# Patient Record
Sex: Female | Born: 1975 | Race: Black or African American | Hispanic: No | Marital: Married | Smoking: Never smoker
Health system: Southern US, Community
[De-identification: ages and names within clinical notes are randomized; demographics above are authoritative.]

## PROBLEM LIST (undated history)

## (undated) DIAGNOSIS — F419 Anxiety disorder, unspecified: Secondary | ICD-10-CM

## (undated) DIAGNOSIS — R519 Headache, unspecified: Secondary | ICD-10-CM

## (undated) DIAGNOSIS — M659 Synovitis and tenosynovitis, unspecified: Secondary | ICD-10-CM

## (undated) DIAGNOSIS — Z9889 Other specified postprocedural states: Secondary | ICD-10-CM

## (undated) DIAGNOSIS — Z905 Acquired absence of kidney: Secondary | ICD-10-CM

## (undated) DIAGNOSIS — O2331 Infections of other parts of urinary tract in pregnancy, first trimester: Secondary | ICD-10-CM

## (undated) DIAGNOSIS — M65939 Unspecified synovitis and tenosynovitis, unspecified forearm: Secondary | ICD-10-CM

## (undated) DIAGNOSIS — K219 Gastro-esophageal reflux disease without esophagitis: Secondary | ICD-10-CM

## (undated) DIAGNOSIS — S63599A Other specified sprain of unspecified wrist, initial encounter: Secondary | ICD-10-CM

## (undated) DIAGNOSIS — F32A Depression, unspecified: Secondary | ICD-10-CM

## (undated) DIAGNOSIS — F329 Major depressive disorder, single episode, unspecified: Secondary | ICD-10-CM

## (undated) DIAGNOSIS — Z8719 Personal history of other diseases of the digestive system: Secondary | ICD-10-CM

## (undated) HISTORY — PX: SPINE SURGERY: SHX786

## (undated) HISTORY — PX: TUBAL LIGATION: SHX77

## (undated) HISTORY — PX: EYE SURGERY: SHX253

## (undated) HISTORY — DX: Major depressive disorder, single episode, unspecified: F32.9

## (undated) HISTORY — PX: ABDOMINAL HYSTERECTOMY: SHX81

## (undated) HISTORY — PX: BREAST SURGERY: SHX581

## (undated) HISTORY — DX: Depression, unspecified: F32.A

## (undated) HISTORY — PX: HERNIA REPAIR: SHX51

## (undated) HISTORY — DX: Infections of other parts of urinary tract in pregnancy, first trimester: O23.31

## (undated) HISTORY — DX: Anxiety disorder, unspecified: F41.9

---
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
## 1997-07-01 ENCOUNTER — Emergency Department (HOSPITAL_COMMUNITY): Admission: EM | Admit: 1997-07-01 | Discharge: 1997-07-01 | Payer: Self-pay | Admitting: Emergency Medicine

## 1997-10-06 ENCOUNTER — Emergency Department (HOSPITAL_COMMUNITY): Admission: EM | Admit: 1997-10-06 | Discharge: 1997-10-06 | Payer: Self-pay | Admitting: Emergency Medicine

## 1997-10-08 ENCOUNTER — Encounter: Admission: RE | Admit: 1997-10-08 | Discharge: 1998-01-06 | Payer: Self-pay | Admitting: Internal Medicine

## 1998-02-09 ENCOUNTER — Emergency Department (HOSPITAL_COMMUNITY): Admission: EM | Admit: 1998-02-09 | Discharge: 1998-02-09 | Payer: Self-pay | Admitting: Endocrinology

## 1998-02-09 ENCOUNTER — Encounter: Payer: Self-pay | Admitting: Endocrinology

## 1998-09-11 ENCOUNTER — Other Ambulatory Visit: Admission: RE | Admit: 1998-09-11 | Discharge: 1998-09-11 | Payer: Self-pay | Admitting: Obstetrics and Gynecology

## 1998-11-24 ENCOUNTER — Inpatient Hospital Stay (HOSPITAL_COMMUNITY): Admission: AD | Admit: 1998-11-24 | Discharge: 1998-11-24 | Payer: Self-pay | Admitting: Obstetrics and Gynecology

## 1999-03-05 ENCOUNTER — Inpatient Hospital Stay (HOSPITAL_COMMUNITY): Admission: AD | Admit: 1999-03-05 | Discharge: 1999-03-05 | Payer: Self-pay | Admitting: Obstetrics and Gynecology

## 1999-03-07 ENCOUNTER — Observation Stay (HOSPITAL_COMMUNITY): Admission: AD | Admit: 1999-03-07 | Discharge: 1999-03-07 | Payer: Self-pay | Admitting: Obstetrics & Gynecology

## 1999-03-12 ENCOUNTER — Inpatient Hospital Stay (HOSPITAL_COMMUNITY): Admission: AD | Admit: 1999-03-12 | Discharge: 1999-03-12 | Payer: Self-pay | Admitting: Obstetrics & Gynecology

## 1999-04-08 ENCOUNTER — Inpatient Hospital Stay (HOSPITAL_COMMUNITY): Admission: AD | Admit: 1999-04-08 | Discharge: 1999-04-08 | Payer: Self-pay | Admitting: Obstetrics and Gynecology

## 1999-04-09 ENCOUNTER — Inpatient Hospital Stay (HOSPITAL_COMMUNITY): Admission: AD | Admit: 1999-04-09 | Discharge: 1999-04-12 | Payer: Self-pay | Admitting: Obstetrics and Gynecology

## 2000-09-10 ENCOUNTER — Other Ambulatory Visit: Admission: RE | Admit: 2000-09-10 | Discharge: 2000-09-10 | Payer: Self-pay | Admitting: Obstetrics and Gynecology

## 2000-10-08 ENCOUNTER — Inpatient Hospital Stay (HOSPITAL_COMMUNITY): Admission: AD | Admit: 2000-10-08 | Discharge: 2000-10-08 | Payer: Self-pay | Admitting: Obstetrics and Gynecology

## 2001-02-14 ENCOUNTER — Inpatient Hospital Stay (HOSPITAL_COMMUNITY): Admission: AD | Admit: 2001-02-14 | Discharge: 2001-02-14 | Payer: Self-pay | Admitting: Obstetrics and Gynecology

## 2001-03-21 ENCOUNTER — Inpatient Hospital Stay (HOSPITAL_COMMUNITY): Admission: AD | Admit: 2001-03-21 | Discharge: 2001-03-24 | Payer: Self-pay | Admitting: Obstetrics and Gynecology

## 2001-03-27 ENCOUNTER — Encounter: Admission: RE | Admit: 2001-03-27 | Discharge: 2001-04-26 | Payer: Self-pay | Admitting: Obstetrics and Gynecology

## 2001-05-04 ENCOUNTER — Encounter: Payer: Self-pay | Admitting: Obstetrics and Gynecology

## 2001-05-05 ENCOUNTER — Encounter: Payer: Self-pay | Admitting: Obstetrics and Gynecology

## 2001-05-05 ENCOUNTER — Inpatient Hospital Stay (HOSPITAL_COMMUNITY): Admission: RE | Admit: 2001-05-05 | Discharge: 2001-05-06 | Payer: Self-pay | Admitting: Obstetrics and Gynecology

## 2001-05-10 ENCOUNTER — Ambulatory Visit (HOSPITAL_COMMUNITY): Admission: RE | Admit: 2001-05-10 | Discharge: 2001-05-10 | Payer: Self-pay | Admitting: Urology

## 2001-05-10 ENCOUNTER — Encounter: Payer: Self-pay | Admitting: Urology

## 2001-05-13 ENCOUNTER — Emergency Department (HOSPITAL_COMMUNITY): Admission: EM | Admit: 2001-05-13 | Discharge: 2001-05-13 | Payer: Self-pay | Admitting: Emergency Medicine

## 2001-05-16 ENCOUNTER — Ambulatory Visit (HOSPITAL_BASED_OUTPATIENT_CLINIC_OR_DEPARTMENT_OTHER): Admission: RE | Admit: 2001-05-16 | Discharge: 2001-05-16 | Payer: Self-pay | Admitting: Urology

## 2001-06-20 ENCOUNTER — Ambulatory Visit (HOSPITAL_COMMUNITY): Admission: RE | Admit: 2001-06-20 | Discharge: 2001-06-20 | Payer: Self-pay | Admitting: Urology

## 2001-06-20 ENCOUNTER — Encounter: Payer: Self-pay | Admitting: Urology

## 2001-11-02 ENCOUNTER — Ambulatory Visit (HOSPITAL_BASED_OUTPATIENT_CLINIC_OR_DEPARTMENT_OTHER): Admission: RE | Admit: 2001-11-02 | Discharge: 2001-11-02 | Payer: Self-pay | Admitting: Urology

## 2001-11-08 ENCOUNTER — Ambulatory Visit (HOSPITAL_COMMUNITY): Admission: RE | Admit: 2001-11-08 | Discharge: 2001-11-08 | Payer: Self-pay | Admitting: Urology

## 2001-11-10 ENCOUNTER — Ambulatory Visit (HOSPITAL_COMMUNITY): Admission: RE | Admit: 2001-11-10 | Discharge: 2001-11-10 | Payer: Self-pay | Admitting: Urology

## 2001-11-10 ENCOUNTER — Encounter: Payer: Self-pay | Admitting: Urology

## 2001-11-28 ENCOUNTER — Ambulatory Visit (HOSPITAL_COMMUNITY): Admission: RE | Admit: 2001-11-28 | Discharge: 2001-11-28 | Payer: Self-pay | Admitting: Urology

## 2001-11-28 ENCOUNTER — Encounter: Payer: Self-pay | Admitting: Urology

## 2001-12-05 ENCOUNTER — Encounter: Payer: Self-pay | Admitting: Urology

## 2001-12-05 ENCOUNTER — Observation Stay (HOSPITAL_COMMUNITY): Admission: RE | Admit: 2001-12-05 | Discharge: 2001-12-06 | Payer: Self-pay | Admitting: Urology

## 2001-12-05 HISTORY — PX: OTHER SURGICAL HISTORY: SHX169

## 2002-01-11 ENCOUNTER — Other Ambulatory Visit: Admission: RE | Admit: 2002-01-11 | Discharge: 2002-01-11 | Payer: Self-pay | Admitting: Obstetrics and Gynecology

## 2002-03-01 ENCOUNTER — Ambulatory Visit (HOSPITAL_BASED_OUTPATIENT_CLINIC_OR_DEPARTMENT_OTHER): Admission: RE | Admit: 2002-03-01 | Discharge: 2002-03-01 | Payer: Self-pay | Admitting: Urology

## 2002-03-01 HISTORY — PX: OTHER SURGICAL HISTORY: SHX169

## 2002-04-12 ENCOUNTER — Encounter (INDEPENDENT_AMBULATORY_CARE_PROVIDER_SITE_OTHER): Payer: Self-pay | Admitting: Specialist

## 2002-04-12 ENCOUNTER — Inpatient Hospital Stay (HOSPITAL_COMMUNITY): Admission: RE | Admit: 2002-04-12 | Discharge: 2002-04-15 | Payer: Self-pay | Admitting: Urology

## 2002-04-12 HISTORY — PX: TOTAL NEPHRECTOMY: SHX415

## 2003-01-25 ENCOUNTER — Other Ambulatory Visit: Admission: RE | Admit: 2003-01-25 | Discharge: 2003-01-25 | Payer: Self-pay | Admitting: Obstetrics and Gynecology

## 2003-02-06 ENCOUNTER — Emergency Department (HOSPITAL_COMMUNITY): Admission: EM | Admit: 2003-02-06 | Discharge: 2003-02-06 | Payer: Self-pay | Admitting: Emergency Medicine

## 2003-11-25 ENCOUNTER — Emergency Department (HOSPITAL_COMMUNITY): Admission: EM | Admit: 2003-11-25 | Discharge: 2003-11-25 | Payer: Self-pay | Admitting: Emergency Medicine

## 2004-01-30 ENCOUNTER — Other Ambulatory Visit: Admission: RE | Admit: 2004-01-30 | Discharge: 2004-01-30 | Payer: Self-pay | Admitting: Obstetrics and Gynecology

## 2004-04-30 ENCOUNTER — Emergency Department (HOSPITAL_COMMUNITY): Admission: EM | Admit: 2004-04-30 | Discharge: 2004-04-30 | Payer: Self-pay | Admitting: Family Medicine

## 2004-07-22 ENCOUNTER — Other Ambulatory Visit: Admission: RE | Admit: 2004-07-22 | Discharge: 2004-07-22 | Payer: Self-pay | Admitting: Obstetrics and Gynecology

## 2005-02-11 ENCOUNTER — Other Ambulatory Visit: Admission: RE | Admit: 2005-02-11 | Discharge: 2005-02-11 | Payer: Self-pay | Admitting: Obstetrics and Gynecology

## 2005-02-17 ENCOUNTER — Encounter: Admission: RE | Admit: 2005-02-17 | Discharge: 2005-02-17 | Payer: Self-pay | Admitting: Obstetrics and Gynecology

## 2005-02-20 ENCOUNTER — Ambulatory Visit (HOSPITAL_COMMUNITY): Admission: RE | Admit: 2005-02-20 | Discharge: 2005-02-20 | Payer: Self-pay | Admitting: Obstetrics and Gynecology

## 2005-03-10 ENCOUNTER — Ambulatory Visit: Payer: Self-pay | Admitting: Internal Medicine

## 2005-03-10 ENCOUNTER — Ambulatory Visit (HOSPITAL_COMMUNITY): Admission: RE | Admit: 2005-03-10 | Discharge: 2005-03-10 | Payer: Self-pay | Admitting: Internal Medicine

## 2005-03-18 ENCOUNTER — Ambulatory Visit: Payer: Self-pay | Admitting: Internal Medicine

## 2005-03-25 ENCOUNTER — Encounter: Admission: RE | Admit: 2005-03-25 | Discharge: 2005-04-16 | Payer: Self-pay | Admitting: Internal Medicine

## 2005-05-12 ENCOUNTER — Ambulatory Visit: Payer: Self-pay | Admitting: Internal Medicine

## 2005-07-07 ENCOUNTER — Other Ambulatory Visit: Admission: RE | Admit: 2005-07-07 | Discharge: 2005-07-07 | Payer: Self-pay | Admitting: Obstetrics and Gynecology

## 2006-05-18 ENCOUNTER — Ambulatory Visit: Payer: Self-pay | Admitting: Internal Medicine

## 2006-05-20 ENCOUNTER — Ambulatory Visit: Payer: Self-pay | Admitting: Internal Medicine

## 2006-05-20 ENCOUNTER — Observation Stay (HOSPITAL_COMMUNITY): Admission: EM | Admit: 2006-05-20 | Discharge: 2006-05-21 | Payer: Self-pay | Admitting: Internal Medicine

## 2006-05-21 ENCOUNTER — Ambulatory Visit: Payer: Self-pay | Admitting: Internal Medicine

## 2006-05-26 ENCOUNTER — Ambulatory Visit: Payer: Self-pay | Admitting: Internal Medicine

## 2006-05-26 LAB — CONVERTED CEMR LAB
BUN: 7 mg/dL (ref 6–23)
CO2: 29 meq/L (ref 19–32)
Calcium: 9.6 mg/dL (ref 8.4–10.5)
Chloride: 109 meq/L (ref 96–112)
Creatinine, Ser: 0.8 mg/dL (ref 0.4–1.2)
GFR calc Af Amer: 106 mL/min
GFR calc non Af Amer: 88 mL/min
Glucose, Bld: 84 mg/dL (ref 70–99)
Potassium: 4.1 meq/L (ref 3.5–5.1)
Sodium: 143 meq/L (ref 135–145)
hCG, Beta Chain, Quant, S: <0.5 milliintl units/mL

## 2006-08-22 ENCOUNTER — Encounter (INDEPENDENT_AMBULATORY_CARE_PROVIDER_SITE_OTHER): Payer: Self-pay | Admitting: Obstetrics and Gynecology

## 2006-08-23 ENCOUNTER — Ambulatory Visit (HOSPITAL_COMMUNITY): Admission: RE | Admit: 2006-08-23 | Discharge: 2006-08-23 | Payer: Self-pay | Admitting: Obstetrics and Gynecology

## 2006-08-23 HISTORY — PX: OTHER SURGICAL HISTORY: SHX169

## 2007-07-18 ENCOUNTER — Emergency Department (HOSPITAL_COMMUNITY): Admission: EM | Admit: 2007-07-18 | Discharge: 2007-07-18 | Payer: Self-pay | Admitting: Emergency Medicine

## 2007-08-29 ENCOUNTER — Ambulatory Visit: Payer: Self-pay | Admitting: Internal Medicine

## 2007-08-29 ENCOUNTER — Ambulatory Visit: Payer: Self-pay | Admitting: *Deleted

## 2007-08-29 ENCOUNTER — Encounter: Payer: Self-pay | Admitting: Family Medicine

## 2007-08-29 LAB — CONVERTED CEMR LAB
ALT: 10 units/L (ref 0–35)
AST: 16 units/L (ref 0–37)
Albumin: 4.6 g/dL (ref 3.5–5.2)
Basophils Absolute: 0 10*3/uL (ref 0.0–0.1)
Basophils Relative: 0 % (ref 0–1)
CO2: 24 meq/L (ref 19–32)
Calcium: 8.8 mg/dL (ref 8.4–10.5)
Chloride: 107 meq/L (ref 96–112)
Helicobacter Pylori Antibody-IgG: 0.6
Lymphocytes Relative: 49 % — ABNORMAL HIGH (ref 12–46)
MCHC: 33.5 g/dL (ref 30.0–36.0)
Neutro Abs: 2 10*3/uL (ref 1.7–7.7)
Neutrophils Relative %: 43 % (ref 43–77)
Potassium: 4.5 meq/L (ref 3.5–5.3)
RBC: 4.48 M/uL (ref 3.87–5.11)
RDW: 13.2 % (ref 11.5–15.5)
TSH: 0.947 microintl units/mL (ref 0.350–4.50)
Total Protein: 7.7 g/dL (ref 6.0–8.3)

## 2008-01-09 ENCOUNTER — Ambulatory Visit: Payer: Self-pay | Admitting: Family Medicine

## 2008-01-09 ENCOUNTER — Encounter: Payer: Self-pay | Admitting: Family Medicine

## 2008-01-09 LAB — CONVERTED CEMR LAB
Chlamydia, DNA Probe: NEGATIVE
GC Probe Amp, Genital: NEGATIVE

## 2008-01-24 ENCOUNTER — Ambulatory Visit (HOSPITAL_COMMUNITY): Admission: RE | Admit: 2008-01-24 | Discharge: 2008-01-24 | Payer: Self-pay | Admitting: Family Medicine

## 2008-02-01 ENCOUNTER — Encounter: Admission: RE | Admit: 2008-02-01 | Discharge: 2008-02-01 | Payer: Self-pay | Admitting: Family Medicine

## 2008-04-11 ENCOUNTER — Emergency Department (HOSPITAL_COMMUNITY): Admission: EM | Admit: 2008-04-11 | Discharge: 2008-04-12 | Payer: Self-pay | Admitting: Family Medicine

## 2008-04-12 ENCOUNTER — Emergency Department (HOSPITAL_COMMUNITY): Admission: EM | Admit: 2008-04-12 | Discharge: 2008-04-13 | Payer: Self-pay | Admitting: Emergency Medicine

## 2008-05-04 ENCOUNTER — Encounter: Payer: Self-pay | Admitting: Family Medicine

## 2008-05-04 ENCOUNTER — Ambulatory Visit: Payer: Self-pay | Admitting: Family Medicine

## 2008-08-23 ENCOUNTER — Emergency Department (HOSPITAL_COMMUNITY): Admission: EM | Admit: 2008-08-23 | Discharge: 2008-08-23 | Payer: Self-pay | Admitting: Emergency Medicine

## 2008-10-22 ENCOUNTER — Ambulatory Visit: Payer: Self-pay | Admitting: Family Medicine

## 2008-10-24 ENCOUNTER — Ambulatory Visit: Payer: Self-pay | Admitting: Internal Medicine

## 2008-10-24 ENCOUNTER — Encounter: Payer: Self-pay | Admitting: Family Medicine

## 2008-10-24 LAB — CONVERTED CEMR LAB
ALT: 10 U/L
AST: 14 U/L
Albumin: 4.2 g/dL
Alkaline Phosphatase: 43 U/L
BUN: 9 mg/dL
Basophils Absolute: 0 10*3/uL
Basophils Relative: 0 %
CO2: 23 meq/L
Calcium: 8.8 mg/dL
Chloride: 106 meq/L
Creatinine, Ser: 0.98 mg/dL
Eosinophils Absolute: 0.2 10*3/uL
Eosinophils Relative: 3 %
Glucose, Bld: 89 mg/dL
HCT: 38.8 %
Helicobacter Pylori Antibody-IgG: 0.6
Hemoglobin: 12.9 g/dL
Lymphocytes Relative: 50 % — ABNORMAL HIGH
Lymphs Abs: 2.7 10*3/uL
MCHC: 33.2 g/dL
MCV: 92.4 fL
Monocytes Absolute: 0.4 10*3/uL
Monocytes Relative: 8 %
Neutro Abs: 2.1 10*3/uL
Neutrophils Relative %: 39 % — ABNORMAL LOW
Platelets: 206 10*3/uL
Potassium: 4 meq/L
RBC: 4.2 M/uL
RDW: 12.2 %
Sodium: 142 meq/L
Total Bilirubin: 0.4 mg/dL
Total Protein: 6.7 g/dL
WBC: 5.4 10*3/uL

## 2008-11-16 ENCOUNTER — Encounter: Payer: Self-pay | Admitting: Family Medicine

## 2008-11-16 ENCOUNTER — Ambulatory Visit: Payer: Self-pay | Admitting: Internal Medicine

## 2008-12-11 ENCOUNTER — Ambulatory Visit (HOSPITAL_BASED_OUTPATIENT_CLINIC_OR_DEPARTMENT_OTHER): Admission: RE | Admit: 2008-12-11 | Discharge: 2008-12-12 | Payer: Self-pay | Admitting: General Surgery

## 2008-12-11 HISTORY — PX: UMBILICAL HERNIA REPAIR: SHX196

## 2009-08-26 ENCOUNTER — Encounter: Admission: RE | Admit: 2009-08-26 | Discharge: 2009-08-26 | Payer: Self-pay | Admitting: General Surgery

## 2009-08-28 ENCOUNTER — Ambulatory Visit: Payer: Self-pay | Admitting: Internal Medicine

## 2009-08-28 LAB — CONVERTED CEMR LAB
ALT: 13 U/L (ref 0–35)
AST: 17 U/L (ref 0–37)
Albumin: 4.7 g/dL (ref 3.5–5.2)
Alkaline Phosphatase: 46 U/L (ref 39–117)
Amylase: 103 U/L (ref 0–105)
BUN: 9 mg/dL (ref 6–23)
Basophils Absolute: 0 K/uL (ref 0.0–0.1)
Basophils Relative: 0 % (ref 0–1)
CO2: 24 meq/L (ref 19–32)
Calcium: 9.4 mg/dL (ref 8.4–10.5)
Chloride: 104 meq/L (ref 96–112)
Creatinine, Ser: 0.93 mg/dL (ref 0.40–1.20)
Eosinophils Absolute: 0.2 K/uL (ref 0.0–0.7)
Eosinophils Relative: 3 % (ref 0–5)
Glucose, Bld: 79 mg/dL (ref 70–99)
HCT: 41.2 % (ref 36.0–46.0)
Helicobacter Pylori Antibody-IgG: 1.4 — ABNORMAL HIGH
Hemoglobin: 13.7 g/dL (ref 12.0–15.0)
Hgb A1c MFr Bld: 5.4 % (ref <5.7)
Lipase: 47 U/L (ref 0–75)
Lymphocytes Relative: 47 % — ABNORMAL HIGH (ref 12–46)
Lymphs Abs: 2.6 K/uL (ref 0.7–4.0)
MCHC: 33.3 g/dL (ref 30.0–36.0)
MCV: 94.3 fL (ref 78.0–100.0)
Monocytes Absolute: 0.3 K/uL (ref 0.1–1.0)
Monocytes Relative: 5 % (ref 3–12)
Neutro Abs: 2.5 K/uL (ref 1.7–7.7)
Neutrophils Relative %: 45 % (ref 43–77)
Platelets: 202 K/uL (ref 150–400)
Potassium: 3.9 meq/L (ref 3.5–5.3)
RBC: 4.37 M/uL (ref 3.87–5.11)
RDW: 12.9 % (ref 11.5–15.5)
Sodium: 139 meq/L (ref 135–145)
Total Bilirubin: 0.3 mg/dL (ref 0.3–1.2)
Total Protein: 7.5 g/dL (ref 6.0–8.3)
WBC: 5.6 10*3/microliter (ref 4.0–10.5)

## 2009-09-04 ENCOUNTER — Ambulatory Visit: Payer: Self-pay | Admitting: Internal Medicine

## 2009-10-24 ENCOUNTER — Emergency Department (HOSPITAL_COMMUNITY): Admission: EM | Admit: 2009-10-24 | Discharge: 2009-10-24 | Payer: Self-pay | Admitting: Emergency Medicine

## 2010-01-06 ENCOUNTER — Other Ambulatory Visit
Admission: RE | Admit: 2010-01-06 | Discharge: 2010-01-06 | Payer: Self-pay | Source: Home / Self Care | Admitting: Family Medicine

## 2010-01-17 ENCOUNTER — Encounter
Admission: RE | Admit: 2010-01-17 | Discharge: 2010-01-17 | Payer: Self-pay | Source: Home / Self Care | Attending: Family Medicine | Admitting: Family Medicine

## 2010-02-16 ENCOUNTER — Encounter: Payer: Self-pay | Admitting: Internal Medicine

## 2010-04-07 ENCOUNTER — Emergency Department (HOSPITAL_COMMUNITY)
Admission: EM | Admit: 2010-04-07 | Discharge: 2010-04-07 | Disposition: A | Discharge status: Home / Self Care | Payer: BLUE CROSS/BLUE SHIELD | Attending: Emergency Medicine | Admitting: Emergency Medicine

## 2010-04-07 ENCOUNTER — Emergency Department (HOSPITAL_COMMUNITY): Payer: BLUE CROSS/BLUE SHIELD

## 2010-04-07 DIAGNOSIS — R110 Nausea: Secondary | ICD-10-CM | POA: Insufficient documentation

## 2010-04-07 DIAGNOSIS — R1031 Right lower quadrant pain: Secondary | ICD-10-CM | POA: Insufficient documentation

## 2010-04-07 LAB — CBC
Hemoglobin: 12.7 g/dL (ref 12.0–15.0)
MCH: 30.9 pg (ref 26.0–34.0)
MCHC: 33.3 g/dL (ref 30.0–36.0)
MCV: 92.7 fL (ref 78.0–100.0)
Platelets: 196 10*3/uL (ref 150–400)
RBC: 4.11 MIL/uL (ref 3.87–5.11)

## 2010-04-07 LAB — URINALYSIS, ROUTINE W REFLEX MICROSCOPIC
Bilirubin Urine: NEGATIVE
Glucose, UA: NEGATIVE mg/dL
Hgb urine dipstick: NEGATIVE
Protein, ur: NEGATIVE mg/dL

## 2010-04-07 LAB — DIFFERENTIAL
Eosinophils Absolute: 0.1 10*3/uL (ref 0.0–0.7)
Lymphs Abs: 1.1 10*3/uL (ref 0.7–4.0)
Monocytes Absolute: 0.4 10*3/uL (ref 0.1–1.0)
Monocytes Relative: 6 % (ref 3–12)
Neutrophils Relative %: 75 % (ref 43–77)

## 2010-04-07 LAB — BASIC METABOLIC PANEL
BUN: 8 mg/dL (ref 6–23)
CO2: 23 mEq/L (ref 19–32)
Chloride: 105 mEq/L (ref 96–112)
Creatinine, Ser: 0.86 mg/dL (ref 0.4–1.2)

## 2010-04-07 LAB — GC/CHLAMYDIA PROBE AMP, GENITAL
Chlamydia, DNA Probe: NEGATIVE
GC Probe Amp, Genital: NEGATIVE

## 2010-04-07 MED ORDER — IOHEXOL 300 MG/ML  SOLN
100.0000 mL | Freq: Once | INTRAMUSCULAR | Status: AC | PRN
  Administered 2010-04-07: 100 mL via INTRAVENOUS

## 2010-04-08 LAB — URINE CULTURE: Culture  Setup Time: 201203121024

## 2010-04-10 LAB — CBC
HCT: 39.9 % (ref 36.0–46.0)
MCH: 32.2 pg (ref 26.0–34.0)
MCHC: 35.3 g/dL (ref 30.0–36.0)
MCV: 91.1 fL (ref 78.0–100.0)
RDW: 12.2 % (ref 11.5–15.5)

## 2010-04-10 LAB — POCT I-STAT, CHEM 8
Creatinine, Ser: 1 mg/dL (ref 0.4–1.2)
Hemoglobin: 15 g/dL (ref 12.0–15.0)
Sodium: 141 mEq/L (ref 135–145)
TCO2: 21 mmol/L (ref 0–100)

## 2010-04-10 LAB — PROTIME-INR: Prothrombin Time: 13.3 seconds (ref 11.6–15.2)

## 2010-04-10 LAB — COMPREHENSIVE METABOLIC PANEL
Alkaline Phosphatase: 48 U/L (ref 39–117)
BUN: 8 mg/dL (ref 6–23)
Calcium: 9 mg/dL (ref 8.4–10.5)
Glucose, Bld: 103 mg/dL — ABNORMAL HIGH (ref 70–99)
Total Protein: 7.3 g/dL (ref 6.0–8.3)

## 2010-04-10 LAB — APTT: aPTT: 26 seconds (ref 24–37)

## 2010-04-10 LAB — LACTIC ACID, PLASMA: Lactic Acid, Venous: 2.7 mmol/L — ABNORMAL HIGH (ref 0.5–2.2)

## 2010-04-24 ENCOUNTER — Other Ambulatory Visit (HOSPITAL_COMMUNITY): Payer: Self-pay | Admitting: Gastroenterology

## 2010-04-24 DIAGNOSIS — R110 Nausea: Secondary | ICD-10-CM

## 2010-04-25 ENCOUNTER — Other Ambulatory Visit: Payer: Self-pay | Admitting: Gastroenterology

## 2010-04-28 ENCOUNTER — Other Ambulatory Visit (HOSPITAL_COMMUNITY): Payer: Self-pay | Admitting: Gastroenterology

## 2010-04-28 DIAGNOSIS — R110 Nausea: Secondary | ICD-10-CM

## 2010-05-04 LAB — URINALYSIS, ROUTINE W REFLEX MICROSCOPIC
Bilirubin Urine: NEGATIVE
Hgb urine dipstick: NEGATIVE
Ketones, ur: NEGATIVE mg/dL
Protein, ur: NEGATIVE mg/dL
Urobilinogen, UA: 2 mg/dL — ABNORMAL HIGH (ref 0.0–1.0)

## 2010-05-04 LAB — COMPREHENSIVE METABOLIC PANEL
AST: 23 U/L (ref 0–37)
Albumin: 4.4 g/dL (ref 3.5–5.2)
Calcium: 9.8 mg/dL (ref 8.4–10.5)
Creatinine, Ser: 1.01 mg/dL (ref 0.4–1.2)
GFR calc Af Amer: >60 mL/min (ref >60)
Total Protein: 7.8 g/dL (ref 6.0–8.3)

## 2010-05-04 LAB — CBC
MCHC: 33.6 g/dL (ref 30.0–36.0)
MCV: 95.3 fL (ref 78.0–100.0)
Platelets: ADEQUATE 10*3/uL (ref 150–400)
RDW: 13.2 % (ref 11.5–15.5)
WBC: 6.3 10*3/uL (ref 4.0–10.5)

## 2010-05-04 LAB — DIFFERENTIAL
Basophils Absolute: 0 10*3/uL (ref 0.0–0.1)
Basophils Relative: 0 % (ref 0–1)
Eosinophils Absolute: 0.1 10*3/uL (ref 0.0–0.7)
Monocytes Relative: 6 % (ref 3–12)
Neutro Abs: 3.9 10*3/uL (ref 1.7–7.7)

## 2010-05-04 LAB — POCT PREGNANCY, URINE: Preg Test, Ur: NEGATIVE

## 2010-05-04 LAB — WET PREP, GENITAL

## 2010-05-04 LAB — GC/CHLAMYDIA PROBE AMP, GENITAL: GC Probe Amp, Genital: NEGATIVE

## 2010-05-06 ENCOUNTER — Encounter (HOSPITAL_COMMUNITY)
Admission: RE | Admit: 2010-05-06 | Discharge: 2010-05-06 | Disposition: A | Discharge status: Home / Self Care | Payer: BLUE CROSS/BLUE SHIELD | Source: Ambulatory Visit | Attending: Gastroenterology | Admitting: Gastroenterology

## 2010-05-06 ENCOUNTER — Emergency Department (HOSPITAL_COMMUNITY)
Admission: EM | Admit: 2010-05-06 | Discharge: 2010-05-06 | Disposition: A | Discharge status: Home / Self Care | Payer: BLUE CROSS/BLUE SHIELD | Attending: Emergency Medicine | Admitting: Emergency Medicine

## 2010-05-06 DIAGNOSIS — R1013 Epigastric pain: Secondary | ICD-10-CM | POA: Insufficient documentation

## 2010-05-06 DIAGNOSIS — R109 Unspecified abdominal pain: Secondary | ICD-10-CM | POA: Insufficient documentation

## 2010-05-06 DIAGNOSIS — R10816 Epigastric abdominal tenderness: Secondary | ICD-10-CM | POA: Insufficient documentation

## 2010-05-06 DIAGNOSIS — R110 Nausea: Secondary | ICD-10-CM | POA: Insufficient documentation

## 2010-05-06 LAB — DIFFERENTIAL
Basophils Relative: 0 % (ref 0–1)
Eosinophils Absolute: 0.1 10*3/uL (ref 0.0–0.7)
Eosinophils Relative: 2 % (ref 0–5)
Lymphs Abs: 2.7 10*3/uL (ref 0.7–4.0)
Monocytes Relative: 6 % (ref 3–12)
Neutrophils Relative %: 48 % (ref 43–77)

## 2010-05-06 LAB — URINALYSIS, ROUTINE W REFLEX MICROSCOPIC
Glucose, UA: NEGATIVE mg/dL
Hgb urine dipstick: NEGATIVE
Ketones, ur: NEGATIVE mg/dL
Protein, ur: NEGATIVE mg/dL
Urobilinogen, UA: 1 mg/dL (ref 0.0–1.0)

## 2010-05-06 LAB — CBC
MCH: 31.4 pg (ref 26.0–34.0)
MCV: 89.8 fL (ref 78.0–100.0)
Platelets: 160 10*3/uL (ref 150–400)
RBC: 4.23 MIL/uL (ref 3.87–5.11)
RDW: 12.1 % (ref 11.5–15.5)

## 2010-05-06 LAB — COMPREHENSIVE METABOLIC PANEL
AST: 18 U/L (ref 0–37)
Albumin: 4 g/dL (ref 3.5–5.2)
BUN: 5 mg/dL — ABNORMAL LOW (ref 6–23)
Calcium: 9 mg/dL (ref 8.4–10.5)
Creatinine, Ser: 0.74 mg/dL (ref 0.4–1.2)
GFR calc Af Amer: >60 mL/min (ref >60)
Total Bilirubin: 0.7 mg/dL (ref 0.3–1.2)
Total Protein: 7 g/dL (ref 6.0–8.3)

## 2010-05-06 MED ORDER — TECHNETIUM TC 99M MEBROFENIN IV KIT
5.0000 | PACK | Freq: Once | INTRAVENOUS | Status: AC | PRN
  Administered 2010-05-06: 5 via INTRAVENOUS

## 2010-05-08 LAB — POCT URINALYSIS DIP (DEVICE)
Bilirubin Urine: NEGATIVE
Glucose, UA: NEGATIVE mg/dL
Hgb urine dipstick: NEGATIVE
Ketones, ur: NEGATIVE mg/dL
Nitrite: NEGATIVE
Specific Gravity, Urine: 1.015 (ref 1.005–1.030)
pH: 5.5 (ref 5.0–8.0)

## 2010-05-08 LAB — URINALYSIS, ROUTINE W REFLEX MICROSCOPIC
Bilirubin Urine: NEGATIVE
Hgb urine dipstick: NEGATIVE
Ketones, ur: 15 mg/dL — AB
Ketones, ur: NEGATIVE mg/dL
Nitrite: NEGATIVE
Protein, ur: NEGATIVE mg/dL
Urobilinogen, UA: 1 mg/dL (ref 0.0–1.0)
Urobilinogen, UA: 1 mg/dL (ref 0.0–1.0)

## 2010-05-08 LAB — COMPREHENSIVE METABOLIC PANEL
ALT: 13 U/L (ref 0–35)
Alkaline Phosphatase: 46 U/L (ref 39–117)
BUN: 7 mg/dL (ref 6–23)
CO2: 25 mEq/L (ref 19–32)
Calcium: 9.2 mg/dL (ref 8.4–10.5)
GFR calc non Af Amer: >60 mL/min (ref >60)
Glucose, Bld: 110 mg/dL — ABNORMAL HIGH (ref 70–99)
Sodium: 135 mEq/L (ref 135–145)

## 2010-05-08 LAB — POCT PREGNANCY, URINE: Preg Test, Ur: NEGATIVE

## 2010-05-08 LAB — DIFFERENTIAL
Basophils Relative: 0 % (ref 0–1)
Eosinophils Absolute: 0.2 10*3/uL (ref 0.0–0.7)
Lymphs Abs: 3 10*3/uL (ref 0.7–4.0)
Neutro Abs: 3.6 10*3/uL (ref 1.7–7.7)
Neutrophils Relative %: 50 % (ref 43–77)

## 2010-05-08 LAB — CBC
HCT: 40.9 % (ref 36.0–46.0)
Hemoglobin: 13.8 g/dL (ref 12.0–15.0)
MCHC: 33.6 g/dL (ref 30.0–36.0)
RBC: 4.23 MIL/uL (ref 3.87–5.11)

## 2010-05-08 LAB — LIPASE, BLOOD: Lipase: 31 U/L (ref 11–59)

## 2010-05-12 ENCOUNTER — Other Ambulatory Visit (HOSPITAL_COMMUNITY): Payer: Self-pay

## 2010-05-13 ENCOUNTER — Other Ambulatory Visit: Payer: Self-pay | Admitting: Gastroenterology

## 2010-05-13 ENCOUNTER — Ambulatory Visit
Admission: RE | Admit: 2010-05-13 | Discharge: 2010-05-13 | Disposition: A | Discharge status: Home / Self Care | Payer: BLUE CROSS/BLUE SHIELD | Source: Ambulatory Visit | Attending: Gastroenterology | Admitting: Gastroenterology

## 2010-06-10 NOTE — Op Note (Signed)
[name], [name] NO.:  [id number]   MEDICAL RECORD NO.:  [id number]          PATIENT TYPE:  AMB   LOCATION:  SDC                           FACILITY:  [facility]   PHYSICIAN:  Hal Morales, M.D.DATE OF BIRTH:  Feb 07, 1975   DATE OF PROCEDURE:  08/23/2006  DATE OF DISCHARGE:                               OPERATIVE REPORT   PREOPERATIVE DIAGNOSES:  Low grade squamous intraepithelial lesion,  menorrhagia, question of endometrial polyp.   POSTOPERATIVE DIAGNOSES:  Low grade squamous intraepithelial lesion,  menorrhagia, no polyp noted.   PROCEDURE:  Loupe electrical excision procedure, hysteroscopy,  dilatation and curettage, NovaSure endometrial ablation.   SURGEON:  Dr. Dierdre Forth   ANESTHESIA:  General orotracheal.   ESTIMATED BLOOD LOSS:  Less than 10 mL.   COMPLICATIONS:  None.   FINDINGS:  At the time of hysteroscopy, endometrial wall was fluffy but  no specific polyp could be appreciated.   SPECIMENS TO PATHOLOGY:  LEEP conization, endocervical curettage,  endometrial curettings.   PROCEDURE:  The patient was taken to the operating room after  appropriate identification placed on the operating table.  After the  attainment of adequate general anesthesia she was placed in the  lithotomy position.  The perineum was prepped with multiple layers of  Betadine and a coated speculum placed in the vagina.  A single-tooth  tenaculum was placed on the cervix outside the transition zone.  Lugol  staining was undertaken.  A 10 mm loupe was then used to excise a cone  shaped portion of cervix and a suture placed at the 12 o'clock position.  Hemostasis was noted to be adequate.  Endocervical curettings were then  obtained.  The cervix was then measured to 2 cm.  The uterus was sounded  to a total of 8 cm.  The endocervix was then dilated to accommodate the  diagnostic hysteroscope and this was used to evaluate all quadrants of  the uterus.  No specific  polyp was noted and the hysteroscope removed.  Curetting of all four quadrants of the uterus revealed a moderate amount  of tissue.  This was sent for pathologic evaluation.  The NovaSure  apparatus was then placed inside the endometrial cavity and the array  opened.  A suture of 2-0 Vicryl had been placed around the cervix in  case it was needed to allow adequate closure of the cervix after the  LEEP conization.  The NovaSure array was then seated with a cavity width  of 4.5 noted.  The NovaSure array assessment was then obtained and  passed and the NovaSure procedure begun with a power of 149 at time of  72 seconds.  The array was then retracted into the NovaSure apparatus  and removed from the endometrial cavity.  A rollerball was then used to  achieve hemostasis in the LEEP bed.  A piece of Gelfoam was placed in  the LEEP conization bed with adequate hemostasis noted.  The pursestring  suture that had been placed in the cervix was tied down.  All  instruments were then removed from the vagina and the patient  awakened  from general anesthesia and taken to the recovery room in satisfactory  condition having tolerated the procedure well and having received 30 mg  of Toradol intravenously and 30 mg of Toradol intramuscularly.  She was  received in the recovery room in satisfactory condition.   DISCHARGE INSTRUCTIONS:  Printed instructions for D&C from the [facility].   DISCHARGE MEDICATIONS:  1. Tylenol 650 mg p.o. q.4 hours p.r.n. pain per patient request.  2. Ibuprofen 600 mg p.o. q.6 hours p.r.n. pain not relieved by Tylenol      for a total of less than or equal to 24 hours.  3. Doxycycline 100 mg p.o. b.i.d. for 7-day.   FOLLOW-UP INSTRUCTIONS:  The patient is to follow-up in 2 weeks at  [facility].      Hal Morales, M.D.  Electronically Signed     VPH/MEDQ  D:  08/23/2006  T:  08/23/2006  Job:  [id number]

## 2010-06-10 NOTE — H&P (Signed)
[name], [name] NO.:  [id number]   MEDICAL RECORD NO.:  [id number]          PATIENT TYPE:  AMB   LOCATION:  SDC                           FACILITY:  [facility]   PHYSICIAN:  Hal Morales, M.D.DATE OF BIRTH:  [birth year]   DATE OF ADMISSION:  08/23/2006  DATE OF DISCHARGE:                              HISTORY & PHYSICAL   HISTORY OF PRESENT ILLNESS:  The patient is a 35 year old black married  female, para 2-0-0-2, who presents for management of abnormal Pap smear  and abnormal uterine bleeding.  The patient has had an abnormal Pap  smear since 2004 with the highest grade abnormality being low-grade SIL.  This low-grade SIL has persisted over time.  She has never had any  actual treatment.   Her Pap smear history follows:  1. December 2004 - Pap smear showing ASCUS with positive high-risk      HPV.  Colposcopy and biopsy showed CIN-1 and a negative ECC.  2. January 2006 - Pap smear showed low-grade SIL with colposcopy and      biopsy showing HPV and negative ECC.  3. June 2006 - Pap smear showed low-grade SIL.  4. January 2007 - low-grade SIL.  Colposcopy showed biopsies with HPV      and an ECC with HPV, though the question of pull through was      raised.  5. June 2007 - Pap smear showed low-grade SIL.  6. March 2008 - Low-grade SIL.  7. Colposcopically-directed biopsies from July 07, 2006 showed focal      koilocytic atypia with HPV effect and ECC was negative.   The patient was seen on July 07, 2006 for evaluation of her Pap smear,  but complained of very heavy menses after having had amenorrhea in March  and then in May of 2008.  Her menses are typically lasting for 6 days  with 3 of those being heavy.  She has had no intramenstrual bleeding.  She has had no significant cramping that required medication.  Evaluation included a fasting blood sugar that was within normal limits,  TSH and prolactin which were within normal limits.  CBC showed a mild  anemia with a hemoglobin of 11.8.  Sonohystogram showed 2 hyperechoic  masses on the posterior wall of the uterus, one measuring 1.4 cm and the  other measuring 1.5 cm.  These both had a single blood flow consistent  with polyps.   PAST MEDICAL HISTORY:   OBSTETRICAL:  The patient had a cesarean section delivery in 2001 for  failure to progress of a female infant whose name is Elijah.  She had a  repeat cesarean section in February of 2003 of a female infant whose name  is Jackelyn Hoehn, and a tubal ligation was done at that time.  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  This was  thought to be due to a congenital ureteropelvic junction stricture, and  a stent was placed.   GYNECOLOGIC HISTORY:  As mentioned above.   SURGICAL HISTORY:  The patient underwent a right nephrectomy secondary  to persistent of obstruction in spite of stenting, and she has done well  from that standpoint since that time.   MEDICAL HISTORY:  The patient denies any medical illnesses.   CURRENT MEDICATIONS:  None.   DRUG SENSITIVITIES:  None known.   FAMILY HISTORY:  Positive for a brother with hemoglobin [place]; however,  this is actually the patient's half brother.   REVIEW OF SYSTEMS:  As mentioned above.   PHYSICAL EXAMINATION:  GENERAL:  The patient is a well-developed black  female in no acute distress.  VITAL SIGNS:  Blood pressure is 110/70, pulse 68, weight 120 pounds.  LUNGS:  Clear.  HEART:  Regular rate and rhythm.  ABDOMEN:  Soft without masses or organomegaly.  There is no CVA  tenderness.  PELVIC:  EG/BUS within normal limits.  The vagina is rugose.  The cervix  is without gross lesions.  The uterus is upper limits of normal size,  mobile and nontender.  Adnexa - no masses.  RECTOVAGINAL:  No masses.   IMPRESSION:  1. Persistent low-grade squamous intraepithelial lesions since 2004      with a  single endocervical curetting suggestive of HPV effect.  2. Recent onset of menorrhagia.  3. Evidence on sonohystogram of endometrial polyps.   DISPOSITION:  A discussion was held with the patient concerning  management of each of these issues.  She is aware that we could continue  to observe her low-grade squamous intraepithelial lesion over time and  that there is a chance that this will spontaneously revert.  In light of  the questionable ECC in the past, however, it is also reasonable to  consider conization of the cervix to sample the tissue from the  endometrial canal.  She thus consents to loop electrical excision, and  this will be done under the same anesthetic as a hysteroscopy and polyp  removal.  The patient is also offered endometrial ablation for  management of her heavy menses, and she has consented to this.  The  risks of anesthesia, bleeding, infection and damage to adjacent organs  have been reviewed, and the patient wishes to proceed on August 23, 2006  at [facility] with hysteroscopy, polyp removal, endometrial  ablation and LEEP procedures.      Hal Morales, M.D.  Electronically Signed     VPH/MEDQ  D:  08/18/2006  T:  08/18/2006  Job:  [id number]

## 2010-06-10 NOTE — Consult Note (Signed)
[name], [name] NO.:  [id number]   MEDICAL RECORD NO.:  [id number]          PATIENT TYPE:  EMS   LOCATION:  MAJO                         FACILITY:  [facility]   PHYSICIAN:  Adolph Pollack, M.D.DATE OF BIRTH:  September 08, 1975   DATE OF CONSULTATION:  DATE OF DISCHARGE:  08/23/2008                                 CONSULTATION   REQUESTING PHYSICIAN:  Tinnie Gens P. Weldon Inches, MD, [facility].   REASON:  Right lower quadrant pain.   HISTORY:  This is a 35 year old female who woke abruptly this morning at  3:30 with sharp right lower quadrant and right pelvic pain.  She took an  antacid, slept for 2 more hours but woke again with the pain.  She says  she voided and was painful voiding on the right side.  She felt she did  not empty completely.  No diarrhea.  She has chronic constipation, and  has not had a bowel movement 4 to 5 days.  There have been no fever or  chills.  The pain did radiate down her right leg.  She says she has had  similar leg pains like this before.  She had been evaluated for this  type of pain before, but nothing obvious had been found.   PAST MEDICAL HISTORY:  1. Chronic constipation.  2. Chronic right ureteropelvic junction obstruction.   PREVIOUS OPERATIONS:  1. Right simple nephrectomy.  2. Cesarean section x2.  3. Bilateral tubal ligation.   ALLERGIES:  None.   MEDICATION:  None.   SOCIAL HISTORY:  She is married.  No tobacco or alcohol use.   REVIEW OF SYSTEMS:  CARDIAC:  No heart disease or hypertension.  PULMONARY:  No asthma, pneumonia.  GI:  No peptic ulcer disease,  diverticulitis, or colitis.  GU:  No kidney stones.   PHYSICAL EXAM:  GENERAL:  A well-developed, well-nourished female.  She  is in no acute distress, pleasant, and cooperative.  VITAL SIGNS:  Temperature is 97.5, pulse 66, blood pressure 108/65,  respiratory rate 16.  NECK:  Supple without masses.  RESPIRATORY:  Breath sounds equal and  clear, respirations unlabored.  CARDIOVASCULAR:  Demonstrates a regular rate, regular rhythm.  No  murmur.  ABDOMEN:  Soft with a reducible umbilical hernia.  There is mild right  lower quadrant pelvic and suprapubic tenderness present, but no  peritoneal signs, no guarding, and no masses.  There is a right flank  scar.  Lower transverse scar noted.   LABORATORY DATA:  Her white cell count is 6300 with a normal  differential.  Urinalysis is negative.  CT scan shows a small  periumbilical hernia containing some large intestinal disease.  There is  a small amount of free pelvic fluid.  There is no right lower quadrant  inflammatory process, appendix is not visualized.  Small left ovarian  cyst present.   IMPRESSION:  Right lower quadrant/pelvic pain - History and physical  exam as well as laboratory data are not consistent with acute  appendicitis.  I think this is more likely potentially acute ruptured  ovarian cyst versus  exacerbation of her chronic constipation.   PLAN:  I have recommended she take daily MiraLax.  Can take  nonsteroidals for pain.  No operative intervention needed at this time.      Adolph Pollack, M.D.  Electronically Signed     TJR/MEDQ  D:  08/23/2008  T:  08/24/2008  Job:  [id number]

## 2010-06-13 NOTE — Consult Note (Signed)
NAMEAVAROSE, MERVINE NO.:  [id number]   MEDICAL RECORD NO.:  [id number]          PATIENT TYPE:  INP   LOCATION:  1510                         FACILITY:  [facility]   PHYSICIAN:  Gustavus Messing. Orlin Hilding, M.D.DATE OF BIRTH:  07/27/1975   DATE OF CONSULTATION:  05/20/2006  DATE OF DISCHARGE:                                 CONSULTATION   CHIEF COMPLAINT:  Syncope, headaches.   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  She was in  her usual state of health until about four days prior to admission when  she had an episode at church of syncope.  While she was standing up she  felt weak, nauseated, and dizzy.  She sat down on a pew.  She continued  to feel tired and weak and dizzy and apparently tried to lay down on the  pew but then slumped over, down to the floor.  According to her husband,  she did not hit her head or actually fall.  She became unconscious for  several minutes and her husband had described her feet as curled up as  well as her hands curled up.  But, there was no seizure activity such  as convulsions, no incontinence of stool or urine, no tongue biting.  She had some brief confusion postictally.  EMS arrived at the site  apparently and noted that she had some difficulty communicating  initially.  She said after she came to her legs were sore and achy.  She  declined transport to the emergency room for evaluation but saw her  primary care physician, Dr. Artist Pais, a couple of days later, on May 18, 2006.  She was scheduled for an MRI of the brain as an outpatient and  neurologic evaluation of syncope.  She has had some change in the  character of her headaches lately, with more bitemporal frontal aching  lasting 15-20 minutes intermittently, with some mild photophobia and  scotoma.  She had been in the  middle of an evaluation for some urge  incontinence and had manometry study done and while she was waiting for  the results, said she felt dizzy, weak, and nauseated again and felt  like she needed to lie down, very similar to the previous episode except  for this time she did not go ahead and pass out.   REVIEW OF SYSTEMS:  Negative for any fevers or any focal neurological  complaints.   PAST MEDICAL HISTORY:  Significant for a right simple nephrectomy in  2004, secondary to chronic right ureteropelvic junction obstruction with  slight pain, due to congenital kidney disease.  She has a history of  menstrual-related migraine, GERD, history of urge incontinence, history  of two C-sections, a heart murmur, postpartum depression, remote tubal  ligation, and benign breast biopsy.   MEDICATIONS:  She takes Claritin as needed, antihistamines.  She was  just put on Detrol today after the results of her bladder test.   ALLERGIES:  She has no known drug allergies.   SOCIAL HISTORY:  She is married and has two children.  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  No smoking, alcohol, or drug abuse.   FAMILY HISTORY:  Positive for cancer and depression.  No history of  seizure disorder or stroke.   OBJECTIVE:  On exam,  VITAL SIGNS:  Temperature is 97.0, pulse 79, respirations 16, blood  pressure 121/69.  NECK:  Supple.  No rigidity, though mild pain with full range of motion.  No bruits.   NEUROLOGIC EXAMINATION:  She is awake and alert and appropriate, with  normal name and repetition and comprehension.  Cranial nerves, pupils  are equal and reactive.  Visual fields are full.  Extraocular movements  are intact.  Facial sensation is normal.  Facial motor activity is  normal.  Hearing is intact.  Palate is symmetric.  Tongue is midline.  On motor exam, there is no drift or satelliting.  She has normal rapid  fine movements, 5/5 strength, normal bulk, tone and strength throughout,  normal gait.   Deep tendon reflexes are 2+ and symmetric.  Downgoing toes  to plantar stimulation.  Coordination:  Finger-to-nose, rapid  alternating movement, heel-to-shin, and tandem gait are normal.  Sensory  exam was also normal.   LABORATORY DATA:  Labs are unremarkable.  GFR is greater than 60.  White  blood cell count 6.7.  MRI of the brain is normal.  The vertebral  arteries are symmetric and look patent to the base of the brain.  The  basilar artery also looks normal.  MRI of the cervical spine shows some  straightening of the normal lordotic curvature of the spine with disk  protrusions from C3-4 through C6-7 which efface the anterior thecal sac  at C3-4 and C4-5 but do not appear to compress the cord.  The axials are  of poor quality, unfortunately.   IMPRESSION:  1. Syncope or near-syncope.  Nothing particularly suggestive of      seizure, though that is not excluded.  VBI is a possibility but she      really does not have risk factors for that. It could be      neurocardiogenic.  2. Headaches.  She has a history of migraines.  This character is      slightly different, however, the MRI scan of her brain is negative.      There is no evidence to suggest meningitis or encephalitis.  She is      afebrile, has normal white blood cell count, has normal cognition,      normal neuro exam, and no nuchal rigidity.  3. Neck pain with straightening of the normal lordotic curvature of      the spine and multilevel disk protrusion from C3-4 through C6-7.   RECOMMENDATIONS:  Would obtain an EEG to rule out seizure.  Consider MR  angiogram of the neck with contrast to evaluate vertebrobasilar system,  although I doubt her symptoms are due to VBI.  She may need cardiac  workup.      Catherine A. Orlin Hilding, M.D.  Electronically Signed     CAW/MEDQ  D:  05/21/2006  T:  05/21/2006  Job:  [id number]

## 2010-06-13 NOTE — H&P (Signed)
NAME:  [name], CHAISSON NO.:  [id number]   MEDICAL RECORD NO.:  [id number]                   PATIENT TYPE:  INP   LOCATION:  H086                                 FACILITY:  [facility]   PHYSICIAN:  Valetta Fuller, M.D.               DATE OF BIRTH:  1975/05/31   DATE OF ADMISSION:  04/12/2002  DATE OF DISCHARGE:                                HISTORY & PHYSICAL   HISTORY OF PRESENT ILLNESS:  The patient is a 35 year old African-American  female with a history of right-sided flank pain.  On imaging she was found  to have right-sided hydronephrosis and subsequently determined to have a  right ureteropelvic junction obstruction by Lasix renogram.  The  differential renal function demonstrated approximately 20% function of her  right kidney versus 80% on her left.  After discussing various treatment  options the patient elected to undergo ureteral stent placement.  This  relieved her symptoms.  Upon removal of her stent her symptoms recurred.  She subsequently decided to undergo Accusize retrograde endopyelotomy.  She  subsequently developed a urinoma with restenosis.  After discussing other  options the patient elected to undergo a simple nephrectomy to alleviate her  symptoms.   PAST MEDICAL HISTORY:  None.   PAST SURGICAL HISTORY:  Accusize retrograde endopyelotomy.   MEDICATIONS:  None.   ALLERGIES:  No known drug allergies.   FAMILY HISTORY:  Positive for sickle cell disease.   SOCIAL HISTORY:  She denies any tobacco or alcohol use.   REVIEW OF SYSTEMS:  A complete review of systems was obtained and is  negative except for the pertinent positive findings including fatigue, right-  sided flank pain as stated in the history of present illness, and  intermittent vaginal bleeding.   PHYSICAL EXAMINATION:  VITAL SIGNS:  Temperature 97.0, heart rate 72,  respirations 12, blood pressure 112/62.  CONSTITUTIONAL:  The patient is an alert and oriented  white female who was  well-nourished, in no acute distress.  HEENT:  Normocephalic, atraumatic.  Extraocular muscles are intact.  NECK:  Supple.  CARDIOVASCULAR:  Regular rate and rhythm without obvious murmurs.  LUNGS:  Clear bilaterally.  ABDOMEN:  The patient has an extremely lax abdominal musculature.  There are  no abdominal masses or bruits noted.  Her abdomen is nontender.  BACK:  No CVA tenderness.  GENITOURINARY:  The patient has normal external female genitalia.  EXTREMITIES:  No clubbing, cyanosis, edema.   IMPRESSION:  Right-sided ureteropelvic junction obstruction with a poorly  functioning kidney status post failure of retrograde endopyelotomy.   PLAN:  The patient has elected to proceed with right simple nephrectomy and  this will be performed today.     Crecencio Mc, M.D.                          Valetta Fuller, M.D.  LB/MEDQ  D:  04/12/2002  T:  04/12/2002  Job:  [id number]

## 2010-06-13 NOTE — Procedures (Signed)
EEG NUMBER:  [id number].   HISTORY:  This is a 35 year old with syncope who is having the EEG done  to evaluate for seizures.   PROCEDURE:  This is a routine EEG.   TECHNICAL DESCRIPTION:  Throughout this routine EEG, there is a  posterior dominant rhythm of 9-10 Hz activity at 30-40 microvolts.  The  background activity is symmetric and mostly comprised of alpha range  activity at 30-45 microvolts.  Photic stimulation nor hyperventilation  were performed throughout this recording.  The patient does become  drowsy however does enter stage II sleep with the appearance of  symmetric vertex waves and sleep spindles.  Throughout this record there  is no evidence of electrographic seizures or interictal discharge  activity.   IMPRESSION:  This routine EEG is within normal limits in the awake and  sleep states.      Bevelyn Buckles. Nash Shearer, M.D.  Electronically Signed     EAV:WUJW  D:  05/21/2006 17:17:06  T:  05/21/2006 21:18:59  Job #:  [id number]

## 2010-06-13 NOTE — Discharge Summary (Signed)
   NAME:  [name], [name]                        ACCOUNT NO.:  [id number]   MEDICAL RECORD NO.:  [id number]                   PATIENT TYPE:  INP   LOCATION:  0381                                 FACILITY:  [facility]   PHYSICIAN:  Valetta Fuller, M.D.               DATE OF BIRTH:  06/12/1975   DATE OF ADMISSION:  04/12/2002  DATE OF DISCHARGE:  04/15/2002                                 DISCHARGE SUMMARY   DISCHARGE DIAGNOSES:  1. Congenital ureteropelvic junction obstruction.  2. Poorly functioning kidney.   PROCEDURE PERFORMED:  Right simple nephrectomy on April 12, 2002.   HOSPITAL COURSE:  The patient is a 35 year old female.  She was diagnosed  with congenital right UPJ obstruction.  She was initially managed with a  double-J stent.  Renal function was noted to be poor despite stent drainage.  Her right kidney was contributing approximately 20% to the overall renal  function.  We discussed at length with the patient the option of having a  reconstruction, attempt at endoscopic improvement in kidney drainage versus  simple nephrectomy.  The patient initially opted for an attempt at  endoscopic improvement of her drainage.  She did get some improvement for a  period of time but then developed recurrent flank pain.  Because of the  poorly functioning nature of the kidney, we did not feel that she was a  particularly good candidate for a reconstructive procedure.  The patient  elected to have a simple nephrectomy.   On April 12, 2002, the patient underwent right simple nephrectomy without  significant complication.  Blood loss was minimal.  Her postoperative course  was essentially uncomplicated.  Her overall renal function really did not  change status post the simple nephrectomy, again indicative of very poorly  functioning kidney.  Her contralateral kidney continued to work well, and  her postoperative hemoglobin was 0.9.  She was discharged home on  postoperative day 3.  She was  afebrile with normal vital signs.  Her exam  was unremarkable.   DISPOSITION:  The patient was discharged to home.  Routine instructions were  given.  She was given a prescription for Tylox.  She will have follow-up in  our office in about five days for staple removal.                                               Valetta Fuller, M.D.    DSG/MEDQ  D:  06/03/2002  T:  06/03/2002  Job:  [id number]

## 2010-06-13 NOTE — Discharge Summary (Signed)
[name], [name] NO.:  [id number]   MEDICAL RECORD NO.:  [id number]          PATIENT TYPE:  INP   LOCATION:  1510                         FACILITY:  [facility]   PHYSICIAN:  Rosalyn Gess. Norins, MD  DATE OF BIRTH:  1976-01-15   DATE OF ADMISSION:  05/20/2006  DATE OF DISCHARGE:  05/21/2006                               DISCHARGE SUMMARY   ADMITTING DIAGNOSES:  1. Syncope.  2. Headache and nausea.   DISCHARGE DIAGNOSIS:  Neurologic, no evidence of seizure activity, no  evidence of meningitis or encephalitis.   CONSULTANTS:  Gustavus Messing. Orlin Hilding, M.D. for neurology.   PROCEDURES:  1. MRI of the brain was read out as a normal study with no abnormal      findings.  2. MRI of cervical spine without contrast revealed a shallow disk      protrusion and mild spurring present at C3-4, C4-5, C5-6, C6-7,      without significant spinal stenosis or cord deformity.  3. MRA - brain which showed normal intracranial arterial circulation,      no stenosis or occlusion.  Both vertebral arteries, basal artery,      and posterior cerebral arteries are patent with no aneurysm.  4. EEG report pending at time of discharge dictation.   HISTORY OF PRESENT ILLNESS:  The patient is a 35 year old African  American woman with a past medical history of congenital kidney disease  status post nephrectomy who presented to Dr. Thomos Lemons in the office  with lightheadedness, nausea, and headache.  She had been seen recently,  May 18, 2006, for evaluation of a syncopal episode that occurred at  church, where she has lightheadedness, weakness, and then loss of  consciousness.  Witnesses reported she had contraction of her toes and  fingers.  There was no obvious seizure activity.  The then patient  presented to Dr. Artist Pais on the day of admission because of photophobia,  mild neck discomfort, but no syncope.  Please see the H&P for past medical history, family history, social  history.   MEDICATIONS ON ADMISSION:  Detrol LA 4 mg daily.   HOSPITAL COURSE:  The patient was admitted to a regular bed.  She was  seen in consultation by the neurology service.  Dr. Bethann Goo impression  was there was no suggestion of a seizure.  There was concern for  possible vertebrobasilar insufficiency, although risk factors are low  versus neurocardiogenic changes.  Headache was thought to be consistent  with a possible migraine.  Neck pain was significant and thought to be  coincidental to loss of lordotic curvature of the spine and multilevel  disk protrusion with no nerve impaction.  The patient during her  hospital stay remained stable.  She had no recurrent syncope or near  syncope.  She had basically remained asymptomatic.  Studies were  completed including MRI as noted.  MRA as noted.  EEG was done report  pending, but no call was received in regards to abnormal findings..  The  patient did have orthostatic vital signs checked both at 0800 hours and  at 1730 hours  and there was no significant drop in blood pressure or  rise in heart rate with position change.   With the patient having ruled out for meningitis, encephalitis,  neurologic abnormality, with normal studies, it was felt she was stable  and ready for discharge home with followup as an outpatient with Dr.  Thomos Lemons.   DISCHARGE EXAMINATION:  VITAL SIGNS:  The patient was afebrile, blood  pressure was 106/60, with a heart rate of 76, respirations were normal.  GENERAL APPEARANCE:  This is a well-nourished, well-developed, vivacious-  appearing African American woman in no acute distress.  CHEST:  Clear.  CARDIOVASCULAR:  Revealed a regular rate and rhythm without murmurs.  NEUROLOGIC:  Grossly nonfocal with the patient being awake, alert,  oriented to person, place, time, and context.  Cranial nerves II-XII  were unremarkable with no abnormal findings.  Cerebellar function was  nonfocal.   LABORATORY:  B-MET at  admission was normal with a serum glucose of 75,  BUN 6, creatinine 0.94.  CBC at admission was normal with a white count  6,700, with a normal differential.  Hemoglobin 12.3.  Urinalysis was  negative.  Sedimentation rate was 22.   DISPOSITION:  The patient is discharged home.  She will continue on  Detrol which was her only home medication.  She may drive limited  distance, would avoid interstate driving until cleared by Dr. Artist Pais.   FOLLOWUP:  The patient is to call the office for a followup appoint with  Dr. Artist Pais, later in the next week.  The patient's condition at time of  discharge dictation is stable.      Rosalyn Gess Norins, MD  Electronically Signed     MEN/MEDQ  D:  05/21/2006  T:  05/21/2006  Job:  [id number]   cc:   Santina Evans A. Orlin Hilding, M.D.  Fax: [phone]   Barbette Hair. [place], DO  [address] [place], [place] [address]

## 2010-06-13 NOTE — H&P (Signed)
[name], [name] NO.:  [id number]   MEDICAL RECORD NO.:  [id number]          PATIENT TYPE:  INP   LOCATION:  1510                         [facility]ACILITY:  [facility]   PHYSICIAN:  Barbette Hair. Artist Pais, DO      DATE OF BIRTH:  [birth year]   DATE OF ADMISSION:  05/20/2006  DATE OF DISCHARGE:                              HISTORY & PHYSICAL   CHIEF COMPLAINT:  Headache, dizziness, and weakness.   HISTORY OF PRESENT ILLNESS:  The patient is a 35 year old African-  American female with past medical history of congenital kidney disease  status post nephrectomy who presents with light-headedness, nausea, and  headache.  She was recently seen on May 18, 2006, due to evaluation of  syncopal episode this past "Sunday.  She described blacking out.  EMS was  called.  Witnesses noticed that patient's toes and fingers were  contracted during the episode.  She denies any previous history of  seizure or blackouts.  She denies any preceding illness.  EMS  recommended followup with primary care physician.   She has been having also some issues with bladder incontinence and was  seen by her OB/GYN and had bladder manometry, results of which are  pending.  She denies any recent stumbling.  No issues with double vision.  No  problems with her speech.  She does also mention some photophobia today  and mild neck discomfort but no syncope.  During Sunday's episode, she  denies bladder incontinence and there was no tongue biting that was  noted.   PAST MEDICAL HISTORY SUMMARY:  1. History of congenital kidney disease status post nephrectomy in      20" 04.  2. C-section in 2001 and 2003.  3. Tubal ligation 2003.  4. Benign breast biopsy 1993.  5. History of postpartum depression.  6. History of childhood heart murmur.   CURRENT MEDICATION:  Detrol LA 4 mg once a day.   ALLERGIES TO MEDICATIONS:  NONE KNOWN.   SOCIAL HISTORY:  The patient is married, has two children.  Currently  works as a  Glass blower/designer.  No tobacco, no alcohol.   FAMILY HISTORY:  Mother has asthma and depression.  Father has a history  of alcoholism.   PHYSICAL EXAMINATION:  VITAL SIGNS:  Weighs 123 pounds, temperature is  98, pulse is 62, BP is 112/73.  GENERAL:  The patient is a pleasant well-developed, well-nourished 63-  year-old African-American female in no apparent distress.  HEENT:  Normocephalic, atraumatic.  Pupils were equal and reactive to  light bilaterally.  Extraocular motility was intact.  Patient was  anicteric.  Conjunctivae was within normal limits.  No evidence of  nystagmus.  NECK EXAM:  Supple.  No adenopathy, carotid bruit or thyromegaly.  CHEST EXAM:  Normal respiratory effort.  Chest was clear to auscultation  bilaterally.  No rhonchi, rales, or wheezing.  CARDIOVASCULAR:  Regular rate and rhythm.  No significant murmurs, rubs,  or gallops appreciated.  ABDOMEN:  Soft, nontender, positive bowel sounds.  No organomegaly.  MUSCULOSKELETAL EXAM:  No clubbing, cyanosis, or edema.  NEUROLOGIC EXAM:  Cranial nerves II-XII were intact.  She was nonfocal.  No cerebellar signs.  Negative swinging flashlight test.  Muscle  strength was 5/5 throughout.  Patient had normal sensation to  temperature and vibration.  Upper extremity reflexes were +1 to +2.  Patella reflexes were +3 bilaterally and had a negative Babinski.   STUDIES:  EKG was performed in the office which revealed normal sinus  rhythm at 65 beats per minute, no acute ST changes were noted.   IMPRESSION:  1. Headache and nausea with recent syncopal episode.  2. History of congenital kidney disease status post right nephrectomy      in 2004.  3. Bladder incontinence of unclear etiology.   RECOMMENDATIONS:  I am somewhat concerned that patient's constellation  of symptoms may be secondary to encephalitis/aseptic meningitis.  We  will admit patient to the hospital for IV fluids and obtain MRI of the  brain  and C-spine.  We will consult neurology.  She will likely need  lumbar puncture.  I will defer specific testing of cerebral spinal fluid  to neurology.      Barbette Hair. Artist Pais, DO  Electronically Signed     RDY/MEDQ  D:  05/20/2006  T:  05/20/2006  Job:  [id number]

## 2010-06-13 NOTE — Op Note (Signed)
[name], [name] NO.:  [id number]   MEDICAL RECORD NO.:  [id number]                   PATIENT TYPE:  AMB   LOCATION:  DAY                                  FACILITY:  [facility]   PHYSICIAN:  Valetta Fuller, M.D.               DATE OF BIRTH:  1975-12-29   DATE OF PROCEDURE:  12/05/2001  DATE OF DISCHARGE:                                 OPERATIVE REPORT   PREOPERATIVE DIAGNOSES:  1. Right congenital ureteropelvic junction obstruction.  2. Right flank pain.  3. Poorly functioning right kidney.   POSTOPERATIVE DIAGNOSES:  1. Right congenital ureteropelvic junction obstruction.  2. Right flank pain.  3. Poorly functioning right kidney.   OPERATION PERFORMED:  Cystoscopy, stent removal, retrograde pyelography,  Accu Size retrograde ureteropelvic junction incision and repeat stent  placement.   SURGEON:  Valetta Fuller, M.D.   ASSISTANT:  Melvyn Novas, M.D.   ANESTHESIA:  General.   INDICATIONS FOR PROCEDURE:  The patient is a 35 year old female.  Approximately six to eight months ago, she developed right flank pain a few  weeks after cesarean section, was noted to have urinoma and severe right  hydronephrosis.  Initial thought was that she might have an iatrogenic  ureteral injury but after placement of a percutaneous nephrostomy tube and  antegrade studies, her ureter was unremarkable and it appeared that she had  a congenital UPJ obstruction.  She had an internalized stent placed and  renal function was estimated at 80% on the left and 20% on the right.  We  felt that this kidney was fairly poorly functioning and we elected not to  any kind of open repair.  We decided to leave her stent indwelling and then  take it out and observe her.  She did well for a couple of months but began  having recurrent intermittent pain.  Follow-up scan showed 75% function on  the left, 25% on the right.  Repeat retrograde showed again a congenital UPJ  obstruction.  Another stent was placed.  I spent considerable time  discussing her options.  This would have included open reconstructive repair  but I have been hesitant to do that given what I would consider borderline  function.  Her other option would be nephrectomy.  We gave her a third  option of possibly trying an Accu Size retrograde incision of her UPJ.  We  felt that give the somewhat poor function that the chance of this working  would be probably 50% at best but may allow her to avoid either open  reconstructive surgery or a nephrectomy and this may be a reasonable  attempt.  I did get a special MRI which showed what was felt to be a small  crossing vessel posterior to the proximal ureter but it was not clear that  it was right at the ureteropelvic junction.  I decided to go ahead with  her  Accu Size from a straight lateral position.   DESCRIPTION OF PROCEDURE:  The patient was brought to the operating room  where she had successful induction of general anesthesia.  Cystoscopy  revealed the previously placed double-J stent to be in good position.  It  was partially removed and a wire was placed up the stent.  We then used the  Accu Size catheter with a sure seal.  A retrograde pyelogram showed the  ureteropelvic junction obstruction. This was not a particularly long area of  narrowing, probably 1 cm or less.  The renal pelvis and caliceal system was  dilated.  The wire was easily identified in the lateral position.  Utilizing  the markers, we were able to straddle this area.  We placed some sterile  water in the renal pelvis.  The guidewire was pulled back so none of it was  exposed and it was completely within the Accu Size catheter.  A syringe with  2.2 cc of fluid was utilized.  The balloon was partially inflated and a  small waist at the UPJ area was identified.  Utilizing 75 watts of pure  cutting current, we depressed the cutting pedal for 4 to 5 seconds while  fully  inflating the balloon.  Again confirmation of the wire in the lateral  position was done.  The balloon was then left inflated for 10 minutes for  tamponade purposes.  The wire was reinserted and the Accu Size catheter was  removed.  There did appear to be some small amount of extravasation of  contrast.  The wire did show good char.  We then used a 10 French/6 French  24 cm endopyelotomy stent which was placed over the wire and confirmed to be  in good position.  A Foley catheter was placed to monitor the patient's  urinary output and degree of hematuria.  The patient appeared to tolerate  the procedure well.  No obvious complications occurred.  She was brought to  recovery in good and stable condition.                                                 Valetta Fuller, M.D.    DSG/MEDQ  D:  12/05/2001  T:  12/05/2001  Job:  [id number]

## 2010-06-13 NOTE — Op Note (Signed)
[name], [name] NO.:  [id number]   MEDICAL RECORD NO.:  [id number]                   PATIENT TYPE:  AMB   LOCATION:  NESC                                 FACILITY:  [facility]   PHYSICIAN:  Valetta Fuller, M.D.               DATE OF BIRTH:  11-10-1975   DATE OF PROCEDURE:  03/01/2002  DATE OF DISCHARGE:                                 OPERATIVE REPORT   PREOPERATIVE DIAGNOSES:  1. History of congenital ureteropelvic junction obstruction.  2. Poorly functioning right kidney.  3. Right hydronephrosis.  4. Flank pain.   POSTOPERATIVE DIAGNOSES:  1. History of congenital ureteropelvic junction obstruction.  2. Poorly functioning right kidney.  3. Right hydronephrosis.  4. Flank pain.   PROCEDURES:  1. Cystoscopy.  2. Retrograde pyelography.  3. Right double J stent placement.   SURGEON:  Valetta Fuller, M.D.   ANESTHESIA:  General.   INDICATIONS:  The patient is a 35 year old female.  Her situation is quite  complex but in summary, she has been noted to have what appears to be a  congenital UPJ obstruction on the right.  Her kidney function has been poor  at about 20%.  Her pain was relieved with a double J stent.  When we removed  the stent, she had recurrent pain.  We discussed three options with her,  which were a reconstructive procedure, nephrectomy, or an attempt at  endoscopic improvement in the obstruction to try to alleviate the need for  either a major reconstruction or nephrectomy.  The patient chose to have an  Acucise procedure, which was uncomplicated.  She seemed to do well and we  removed her stent a couple of weeks ago.  She called me recently with  recurrent flank pain.  She presents now for reassessment and possible  reinsertion of her double J stent.   DESCRIPTION OF PROCEDURE:  The patient was brought to the operating room,  where she had successful induction of general anesthesia.  She was placed in  lithotomy  position and prepped and draped in the usual manner.  Cystoscopy  was unremarkable.  Retrograde pyelogram showed a slight degree of narrowing  in the most proximal aspect of the ureter but no really severely tight UPJ  obstruction.  The renal pelvis was somewhat full but not markedly  hydronephrotic.  We were able to get a guidewire up without any difficulty,  and a 7 [place] stent went in very nicely.  It was my impression that she  might have had some mild narrowing, but certainly I did not feel that her  situation was really severe.  We decided to go ahead and put a stent to see  if we can resolve her pain, but we may want to try removing that stent  before going on to do anything more drastic, since that area did look  reasonably open.  Valetta Fuller, M.D.    DSG/MEDQ  D:  03/01/2002  T:  03/01/2002  Job:  [id number]

## 2010-06-13 NOTE — Op Note (Signed)
NAME:  [name], [name]                        ACCOUNT NO.:  [id number]   MEDICAL RECORD NO.:  [id number]                   PATIENT TYPE:  INP   LOCATION:  0381                                 FACILITY:  [facility]   PHYSICIAN:  Valetta Fuller, M.D.               DATE OF BIRTH:  1975-12-11   DATE OF PROCEDURE:  04/12/2002  DATE OF DISCHARGE:  04/15/2002                                 OPERATIVE REPORT   PREOPERATIVE DIAGNOSES:  Chronic right ureteropelvic junction obstruction,  with chronic flank pain and poorly functioning right kidney.   POSTOPERATIVE DIAGNOSES:  Chronic right ureteropelvic junction obstruction,  with chronic flank pain and poorly functioning right kidney.   PROCEDURE PERFORMED:  Right simple nephrectomy.   SURGEON:  Valetta Fuller, M.D.   ASSISTANT:  Crecencio Mc, M.D.   ANESTHESIA:  General.   INDICATIONS FOR PROCEDURE:  The patient is a 35 year old female.  She was  noted to have severe right hydronephrosis, after giving birth to her most  recent child.  This presented as fairly significant flank pain.  There was  initially some concern that she may have had ureteral injury.  Subsequent  evaluation revealed a somewhat poorly functioning right kidney.  Renal  function was approximately 20% on the right and 80% on the left.  She was  initially managed with a double-J stent.  After leaving the double-J stent  in for some time, her renal function did not improve and continued to be  poor at about 20%.  We had spent considerable time discussing with her  options.  Since her kidney was fairly poorly functioning, she was really a  borderline candidate for any attempt at reconstruction.  We had talked to  her about the possibility of open polyplasty, chronic management with double-  J stents or nephrectomy.  We also gave her the option of attempting a less  invasive procedure for her UPJ obstruction, to try and improve drainage of  the kidney and hopefully resolve  her pain,and allow her to keep her kidney  in situ for whatever minimal function it was providing; also to have her  avoid nephrectomy.   Initially she did well after Accusize procedure, and did not have any  significant flank pain.  Unfortunately, she developed recurrent flank pain.  Retrograde polygram showed some narrowing at her UPJ.  Renal function  continued to be borderline.  We again discussed her options with her, and  she elected to simply have the kidney removed.  She presents now for that  procedure.   TECHNIQUE AND FINDINGS:  The patient was brought to the operating room,  where she had the successful induction of general endotracheal anesthesia.  She was placed in the flank position.  Great care was taken to make sure  that a proper padding was utilized for her extremities, and an axillary roll  was utilized.  The patient was then prepped  and draped in the usual manner.  A twelfth rib flank incision was performed.  A small kidney was encountered.  There was some inflammation around Gerota's fascia, consistent with her  previous hydronephrosis, uronoma and prior Accusize procedure.  We were able  to dissect the Gerota's fascia off of the posterior musculature.  We were  then able to separate Gerota's fascia from the peritoneum, and a standard  poker maneuver was performed.  Inferiorly the ureter was identified and  clipped.  Coming up to the hilum we found two renal arteries, one inferiorly  and one superior to the renal vein.  The arteries were doubly ligated  proximally with silk suture and transected.  The renal vein was then  identified and also doubly ligated proximally.  The kidney was then removed.  The patient remained stable throughout the procedure.  Estimated blood loss  was less than 100 cc.  The wound was copiously irrigated.  The flank  incision was then closed in a standard manner, with three layers anteriorly  and two posteriorly with #1 PDS suture.  Clips  were used in the skin.  The  patient was brought to PACU in stable condition.                                               Valetta Fuller, M.D.    DSG/MEDQ  D:  05/24/2002  T:  05/24/2002  Job:  [id number]

## 2010-10-23 LAB — POCT PREGNANCY, URINE: Operator id: 239701

## 2010-10-23 LAB — POCT URINALYSIS DIP (DEVICE)
Bilirubin Urine: NEGATIVE
Hgb urine dipstick: NEGATIVE
Ketones, ur: NEGATIVE
pH: 5.5

## 2010-11-10 LAB — CBC
HCT: 34.3 — ABNORMAL LOW
Hemoglobin: 11.3 — ABNORMAL LOW
RDW: 15.3 — ABNORMAL HIGH

## 2011-01-27 HISTORY — PX: OTHER SURGICAL HISTORY: SHX169

## 2011-04-01 ENCOUNTER — Emergency Department (HOSPITAL_COMMUNITY): Payer: Self-pay

## 2011-04-01 ENCOUNTER — Emergency Department (HOSPITAL_COMMUNITY)
Admission: EM | Admit: 2011-04-01 | Discharge: 2011-04-01 | Disposition: A | Discharge status: Home / Self Care | Payer: Self-pay | Attending: Emergency Medicine | Admitting: Emergency Medicine

## 2011-04-01 ENCOUNTER — Encounter (HOSPITAL_COMMUNITY): Payer: Self-pay

## 2011-04-01 DIAGNOSIS — R109 Unspecified abdominal pain: Secondary | ICD-10-CM | POA: Insufficient documentation

## 2011-04-01 DIAGNOSIS — K589 Irritable bowel syndrome without diarrhea: Secondary | ICD-10-CM | POA: Insufficient documentation

## 2011-04-01 DIAGNOSIS — Z905 Acquired absence of kidney: Secondary | ICD-10-CM | POA: Insufficient documentation

## 2011-04-01 DIAGNOSIS — N898 Other specified noninflammatory disorders of vagina: Secondary | ICD-10-CM | POA: Insufficient documentation

## 2011-04-01 LAB — URINALYSIS, ROUTINE W REFLEX MICROSCOPIC
Glucose, UA: NEGATIVE mg/dL
Hgb urine dipstick: NEGATIVE
Protein, ur: NEGATIVE mg/dL

## 2011-04-01 LAB — CBC
HCT: 38.6 % (ref 36.0–46.0)
Hemoglobin: 13.3 g/dL (ref 12.0–15.0)
MCV: 90.6 fL (ref 78.0–100.0)
RBC: 4.26 MIL/uL (ref 3.87–5.11)
WBC: 4.2 10*3/uL (ref 4.0–10.5)

## 2011-04-01 LAB — POCT I-STAT, CHEM 8
BUN: 7 mg/dL (ref 6–23)
Calcium, Ion: 1.19 mmol/L (ref 1.12–1.32)
Chloride: 105 mEq/L (ref 96–112)
Creatinine, Ser: 0.8 mg/dL (ref 0.50–1.10)

## 2011-04-01 LAB — DIFFERENTIAL
Basophils Absolute: 0 10*3/uL (ref 0.0–0.1)
Eosinophils Relative: 2 % (ref 0–5)
Lymphocytes Relative: 58 % — ABNORMAL HIGH (ref 12–46)
Lymphs Abs: 2.4 10*3/uL (ref 0.7–4.0)
Monocytes Absolute: 0.2 10*3/uL (ref 0.1–1.0)
Monocytes Relative: 5 % (ref 3–12)
Neutro Abs: 1.5 10*3/uL — ABNORMAL LOW (ref 1.7–7.7)

## 2011-04-01 MED ORDER — DICYCLOMINE HCL 10 MG PO CAPS: 20.0000 mg | ORAL_CAPSULE | Freq: Once | ORAL | Status: DC

## 2011-04-01 MED ORDER — ONDANSETRON HCL 4 MG/2ML IJ SOLN
4.0000 mg | Freq: Once | INTRAMUSCULAR | Status: AC
  Administered 2011-04-01: 4 mg via INTRAVENOUS
  Filled 2011-04-01: qty 2

## 2011-04-01 MED ORDER — IOHEXOL 300 MG/ML  SOLN
80.0000 mL | Freq: Once | INTRAMUSCULAR | Status: AC | PRN
  Administered 2011-04-01: 80 mL via INTRAVENOUS

## 2011-04-01 MED ORDER — MORPHINE SULFATE 4 MG/ML IJ SOLN
4.0000 mg | Freq: Once | INTRAMUSCULAR | Status: AC
  Administered 2011-04-01: 4 mg via INTRAVENOUS
  Filled 2011-04-01: qty 1

## 2011-04-01 MED ORDER — SODIUM CHLORIDE 0.9 % IV SOLN
Freq: Once | INTRAVENOUS | Status: AC
  Administered 2011-04-01: 20 mL/h via INTRAVENOUS

## 2011-04-01 MED ORDER — DICYCLOMINE HCL 10 MG PO CAPS
10.0000 mg | ORAL_CAPSULE | Freq: Once | ORAL | Status: AC
  Administered 2011-04-01: 10 mg via ORAL
  Filled 2011-04-01 (×2): qty 1

## 2011-04-01 MED ORDER — HYDROMORPHONE HCL PF 1 MG/ML IJ SOLN
0.5000 mg | Freq: Once | INTRAMUSCULAR | Status: AC
  Administered 2011-04-01: 0.5 mg via INTRAVENOUS
  Filled 2011-04-01: qty 1

## 2011-04-01 NOTE — Discharge Instructions (Signed)
YOUR CT SCAN AND BLOOD STUDIES ARE NEGATIVE FOR ANY ACUTE PROCESS. PAIN TODAY MAY BE RELATED TO IBS AND YOU SHOULD FOLLOW UP WITH DR. Madilyn Fireman FOR FURTHER EVALUATION AND TREATMENT OR WITH YOUR GASTROENTEROLOGIST. TAKE BENTYL AS DIRECTED. RETURN HERE WITH ANY HIGH FEVER, SEVERE PAIN OR NEW CONCERN.  Abdominal Pain Abdominal pain can be caused by many things. Your caregiver decides the seriousness of your pain by an examination and possibly blood tests and X-rays. Many cases can be observed and treated at home. Most abdominal pain is not caused by a disease and will probably improve without treatment. However, in many cases, more time must pass before a clear cause of the pain can be found. Before that point, it may not be known if you need more testing, or if hospitalization or surgery is needed. HOME CARE INSTRUCTIONS   Do not take laxatives unless directed by your caregiver.   Take pain medicine only as directed by your caregiver.   Only take over-the-counter or prescription medicines for pain, discomfort, or fever as directed by your caregiver.   Try a clear liquid diet (broth, tea, or water) for as long as directed by your caregiver. Slowly move to a bland diet as tolerated.  SEEK IMMEDIATE MEDICAL CARE IF:   The pain does not go away.   You have a fever.   You keep throwing up (vomiting).   The pain is felt only in portions of the abdomen. Pain in the right side could possibly be appendicitis. In an adult, pain in the left lower portion of the abdomen could be colitis or diverticulitis.   You pass bloody or black tarry stools.  MAKE SURE YOU:   Understand these instructions.   Will watch your condition.   Will get help right away if you are not doing well or get worse.  Document Released: 10/22/2004 Document Revised: 01/01/2011 Document Reviewed: 08/31/2007 [facility] Patient Information 2012 [place], [place].  Irritable Bowel Syndrome Irritable Bowel Syndrome (IBS) is caused by a  disturbance of normal bowel function. Other terms used are spastic colon, mucous colitis, and irritable colon. It does not require surgery, nor does it lead to cancer. There is no cure for IBS. But with proper diet, stress reduction, and medication, you will find that your problems (symptoms) will gradually disappear or improve. IBS is a common digestive disorder. It usually appears in late adolescence or early adulthood. Women develop it twice as often as men. CAUSES  After food has been digested and absorbed in the small intestine, waste material is moved into the colon (large intestine). In the colon, water and salts are absorbed from the undigested products coming from the small intestine. The remaining residue, or fecal material, is held for elimination. Under normal circumstances, gentle, rhythmic contractions on the bowel walls push the fecal material along the colon towards the rectum. In IBS, however, these contractions are irregular and poorly coordinated. The fecal material is either retained too long, resulting in constipation, or expelled too soon, producing diarrhea. SYMPTOMS  The most common symptom of IBS is pain. It is typically in the lower left side of the belly (abdomen). But it may occur anywhere in the abdomen. It can be felt as heartburn, backache, or even as a dull pain in the arms or shoulders. The pain comes from excessive bowel-muscle spasms and from the buildup of gas and fecal material in the colon. This pain:  Can range from sharp belly (abdominal) cramps to a dull, continuous ache.  Usually worsens soon after eating.   Is typically relieved by having a bowel movement or passing gas.  Abdominal pain is usually accompanied by constipation. But it may also produce diarrhea. The diarrhea typically occurs right after a meal or upon arising in the morning. The stools are typically soft and watery. They are often flecked with secretions (mucus). Other symptoms of IBS  include:  Bloating.   Loss of appetite.   Heartburn.   Feeling sick to your stomach (nausea).   Belching   Vomiting   Gas.  IBS may also cause a number of symptoms that are unrelated to the digestive system:  Fatigue.   Headaches.   Anxiety   Shortness of breath   Difficulty in concentrating.   Dizziness.  These symptoms tend to come and go. DIAGNOSIS  The symptoms of IBS closely mimic the symptoms of other, more serious digestive disorders. So your caregiver may wish to perform a variety of additional tests to exclude these disorders. He/she wants to be certain of learning what is wrong (diagnosis). The nature and purpose of each test will be explained to you. TREATMENT A number of medications are available to help correct bowel function and/or relieve bowel spasms and abdominal pain. Among the drugs available are:  Mild, non-irritating laxatives for severe constipation and to help restore normal bowel habits.   Specific anti-diarrheal medications to treat severe or prolonged diarrhea.   Anti-spasmodic agents to relieve intestinal cramps.   Your caregiver may also decide to treat you with a mild tranquilizer or sedative during unusually stressful periods in your life.  The important thing to remember is that if any drug is prescribed for you, make sure that you take it exactly as directed. Make sure that your caregiver knows how well it worked for you. HOME CARE INSTRUCTIONS   Avoid foods that are high in fat or oils. Some examples ZOX:WRUEA cream, butter, frankfurters, sausage, and other fatty meats.   Avoid foods that have a laxative effect, such as fruit, fruit juice, and dairy products.   Cut out carbonated drinks, chewing gum, and "gassy" foods, such as beans and cabbage. This may help relieve bloating and belching.   Bran taken with plenty of liquids may help relieve constipation.   Keep track of what foods seem to trigger your symptoms.   Avoid  emotionally charged situations or circumstances that produce anxiety.   Start or continue exercising.   Get plenty of rest and sleep.  MAKE SURE YOU:   Understand these instructions.   Will watch your condition.   Will get help right away if you are not doing well or get worse.  Document Released: 01/12/2005 Document Revised: 01/01/2011 Document Reviewed: 09/02/2007 [facility] Patient Information 2012 [place], [place].

## 2011-04-01 NOTE — ED Provider Notes (Signed)
History     CSN: [id number]  Arrival date & time 04/01/11  8295   First MD Initiated Contact with Patient 04/01/11 [phone]      No chief complaint on file.   (Consider location/radiation/quality/duration/timing/severity/associated sxs/prior treatment) Patient is a 36 y.o. female presenting with abdominal pain. The history is provided by the patient.  Abdominal Pain The primary symptoms of the illness include abdominal pain and nausea. The primary symptoms of the illness do not include fever, vomiting, vaginal discharge or vaginal bleeding. Episode onset: She had pain associated with nausea last week for 2 days then resolved. It returned 2 days ago.  The abdominal pain is located in the suprapubic region. The abdominal pain radiates to the back. The abdominal pain is relieved by nothing. The abdominal pain is exacerbated by vomiting.  Associated with: She denies abnormal vaginal discharge. She reports having an ablation secondary to fibroids years ago and no menses since that time. She has nausea without vomiting or fever. Additional symptoms associated with the illness include urgency. Symptoms associated with the illness do not include chills.    No past medical history on file.  No past surgical history on file.  No family history on file.  History  Substance Use Topics  . Smoking status: Not on file  . Smokeless tobacco: Not on file  . Alcohol Use: Not on file    OB History    No data available      Review of Systems  Constitutional: Negative for fever and chills.  HENT: Negative.   Respiratory: Negative.   Cardiovascular: Negative.   Gastrointestinal: Positive for nausea and abdominal pain. Negative for vomiting.  Genitourinary: Positive for urgency. Negative for vaginal bleeding and vaginal discharge.  Musculoskeletal: Negative.   Skin: Negative.   Neurological: Negative.     Allergies  Review of patient's allergies indicates no known allergies.  Home Medications    Current Outpatient Rx  Name Route Sig Dispense Refill  . DIPHENHYDRAMINE-APAP (SLEEP) 25-500 MG PO TABS Oral Take 1 tablet by mouth at bedtime as needed. For sleep    . POLYETHYLENE GLYCOL 3350 PO PACK Oral Take 17 g by mouth daily.      There were no vitals taken for this visit.  Physical Exam  Constitutional: She appears well-developed and well-nourished.  HENT:  Head: Normocephalic.  Neck: Normal range of motion. Neck supple.  Cardiovascular: Normal rate and regular rhythm.   Pulmonary/Chest: Effort normal and breath sounds normal.  Abdominal: Soft. Bowel sounds are normal. There is no tenderness. There is no rebound and no guarding.  Genitourinary: Uterus normal. Vaginal discharge found.       No adnexal mass or tenderness. No pelvic tenderness to palpation.  Musculoskeletal: Normal range of motion. She exhibits no edema.  Neurological: She is alert. No cranial nerve deficit.  Skin: Skin is warm and dry. No rash noted.  Psychiatric: She has a normal mood and affect.    ED Course  Procedures (including critical care time)   Labs Reviewed  PREGNANCY, URINE  URINALYSIS, ROUTINE W REFLEX MICROSCOPIC  WET PREP, GENITAL  GC/CHLAMYDIA PROBE AMP, GENITAL   No results found.   No diagnosis found.    MDM  CT negative for appendicitis. Patient non-tender in pelvic region and negative UTI. She reports a history of Irritable Bowel with pain in lower portion of abdomen being new and different. Consideration toward progression of IBS symptoms given negative evaluation including CT scan.  Rodena Medin, PA-C 04/01/11 1446

## 2011-04-02 LAB — GC/CHLAMYDIA PROBE AMP, GENITAL: Chlamydia, DNA Probe: NEGATIVE

## 2011-04-02 NOTE — ED Provider Notes (Signed)
Medical screening examination/treatment/procedure(s) were performed by non-physician practitioner and as supervising physician I was immediately available for consultation/collaboration.  Solan Vosler, MD 04/02/11 0726 

## 2012-01-27 HISTORY — PX: LAPAROSCOPIC ASSISTED VAGINAL HYSTERECTOMY: SHX5398

## 2012-08-26 ENCOUNTER — Emergency Department (HOSPITAL_COMMUNITY)
Admission: EM | Admit: 2012-08-26 | Discharge: 2012-08-26 | Disposition: A | Discharge status: Home / Self Care | Payer: BC Managed Care – PPO | Attending: Emergency Medicine | Admitting: Emergency Medicine

## 2012-08-26 ENCOUNTER — Encounter (HOSPITAL_COMMUNITY): Payer: Self-pay | Admitting: Emergency Medicine

## 2012-08-26 DIAGNOSIS — G8929 Other chronic pain: Secondary | ICD-10-CM | POA: Insufficient documentation

## 2012-08-26 DIAGNOSIS — R6883 Chills (without fever): Secondary | ICD-10-CM | POA: Insufficient documentation

## 2012-08-26 DIAGNOSIS — R110 Nausea: Secondary | ICD-10-CM | POA: Insufficient documentation

## 2012-08-26 DIAGNOSIS — R350 Frequency of micturition: Secondary | ICD-10-CM | POA: Insufficient documentation

## 2012-08-26 DIAGNOSIS — Z9889 Other specified postprocedural states: Secondary | ICD-10-CM | POA: Insufficient documentation

## 2012-08-26 DIAGNOSIS — Z905 Acquired absence of kidney: Secondary | ICD-10-CM | POA: Insufficient documentation

## 2012-08-26 DIAGNOSIS — R300 Dysuria: Secondary | ICD-10-CM | POA: Insufficient documentation

## 2012-08-26 DIAGNOSIS — Z79899 Other long term (current) drug therapy: Secondary | ICD-10-CM | POA: Insufficient documentation

## 2012-08-26 DIAGNOSIS — Z3202 Encounter for pregnancy test, result negative: Secondary | ICD-10-CM | POA: Insufficient documentation

## 2012-08-26 DIAGNOSIS — N3090 Cystitis, unspecified without hematuria: Secondary | ICD-10-CM | POA: Insufficient documentation

## 2012-08-26 LAB — BASIC METABOLIC PANEL
BUN: 6 mg/dL (ref 6–23)
CO2: 29 mEq/L (ref 19–32)
Chloride: 103 mEq/L (ref 96–112)
Creatinine, Ser: 0.94 mg/dL (ref 0.50–1.10)

## 2012-08-26 LAB — URINE MICROSCOPIC-ADD ON

## 2012-08-26 LAB — CBC WITH DIFFERENTIAL/PLATELET
Eosinophils Relative: 2 % (ref 0–5)
HCT: 41.4 % (ref 36.0–46.0)
Hemoglobin: 14 g/dL (ref 12.0–15.0)
Lymphocytes Relative: 49 % — ABNORMAL HIGH (ref 12–46)
MCHC: 33.8 g/dL (ref 30.0–36.0)
MCV: 91.8 fL (ref 78.0–100.0)
Monocytes Absolute: 0.3 10*3/uL (ref 0.1–1.0)
Monocytes Relative: 7 % (ref 3–12)
Neutro Abs: 2 10*3/uL (ref 1.7–7.7)
WBC: 4.7 10*3/uL (ref 4.0–10.5)

## 2012-08-26 LAB — URINALYSIS, ROUTINE W REFLEX MICROSCOPIC
Glucose, UA: NEGATIVE mg/dL
Hgb urine dipstick: NEGATIVE
Ketones, ur: NEGATIVE mg/dL
pH: 6.5 (ref 5.0–8.0)

## 2012-08-26 MED ORDER — MORPHINE SULFATE 4 MG/ML IJ SOLN
4.0000 mg | Freq: Once | INTRAMUSCULAR | Status: AC
  Administered 2012-08-26: 4 mg via INTRAVENOUS
  Filled 2012-08-26: qty 1

## 2012-08-26 MED ORDER — ONDANSETRON HCL 4 MG PO TABS: 4.0000 mg | ORAL_TABLET | Freq: Four times a day (QID) | ORAL | Status: DC

## 2012-08-26 MED ORDER — DEXTROSE 5 % IV SOLN
1.0000 g | Freq: Once | INTRAVENOUS | Status: AC
  Administered 2012-08-26: 1 g via INTRAVENOUS
  Filled 2012-08-26: qty 10

## 2012-08-26 MED ORDER — ONDANSETRON HCL 4 MG/2ML IJ SOLN
4.0000 mg | Freq: Once | INTRAMUSCULAR | Status: AC
  Administered 2012-08-26: 4 mg via INTRAVENOUS
  Filled 2012-08-26: qty 2

## 2012-08-26 MED ORDER — CIPROFLOXACIN HCL 500 MG PO TABS: 500.0000 mg | ORAL_TABLET | Freq: Two times a day (BID) | ORAL | Status: DC

## 2012-08-26 MED ORDER — SODIUM CHLORIDE 0.9 % IV BOLUS (SEPSIS)
1000.0000 mL | Freq: Once | INTRAVENOUS | Status: AC
  Administered 2012-08-26: 1000 mL via INTRAVENOUS

## 2012-08-26 MED ORDER — IBUPROFEN 800 MG PO TABS: 800.0000 mg | ORAL_TABLET | Freq: Three times a day (TID) | ORAL | Status: DC

## 2012-08-26 NOTE — ED Notes (Signed)
Pt complains of right lower abdominal pain off and on x 3 years. Pt states this "started again 4 days ago" Pt denies diarrhea or vomiting at this time.

## 2012-08-26 NOTE — ED Notes (Signed)
[id number]<XB> Expected date:<BR> Expected time:<BR> Means of arrival:<BR> Comments:<BR> Hold for triage

## 2012-08-26 NOTE — ED Provider Notes (Signed)
CSN: [id number]     Arrival date & time 08/26/12  1024 History     First MD Initiated Contact with Patient 08/26/12 1036     Chief Complaint  Patient presents with  . Abdominal Pain   (Consider location/radiation/quality/duration/timing/severity/associated sxs/prior Treatment) HPI Comments: Patient is a 37 yo F presenting to the ED for three years of intermittent abdominal pain that has worsened over the last four days. Patient states her pain is located in RLQ and umbilicus region w/o radiation. She describes the pain as severe and sharp when she has an episode of pain. Tylenol typically alleviates her pain, with no known precipitating factors. Patient endorses associated subjective chills and nausea. Denies any vomiting, diarrhea, vaginal discharge or bleeding, fevers, urinary symptoms. Patient no longer has menstrual cycles after cervical ablation.   Patient is a 37 y.o. female presenting with abdominal pain.  Abdominal Pain Associated symptoms include abdominal pain and nausea. Pertinent negatives include no chest pain, fever or vomiting.    History reviewed. No pertinent past medical history. Past Surgical History  Procedure Laterality Date  . Breast surgery    . Hernia repair    . Nephrectomy    . Cesarean section    . Laser ablation condyloma cervical / vulvar     No family history on file. History  Substance Use Topics  . Smoking status: Never Smoker   . Smokeless tobacco: Never Used  . Alcohol Use: No   OB History   Grav Para Term Preterm Abortions TAB SAB Ect Mult Living                 Review of Systems  Constitutional: Negative for fever.  Respiratory: Negative for shortness of breath.   Cardiovascular: Negative for chest pain.  Gastrointestinal: Positive for nausea and abdominal pain. Negative for vomiting, diarrhea, blood in stool, abdominal distention and anal bleeding.  Genitourinary: Positive for dysuria and frequency. Negative for vaginal bleeding, vaginal  discharge and vaginal pain.  All other systems reviewed and are negative.    Allergies  Review of patient's allergies indicates no known allergies.  Home Medications   Current Outpatient Rx  Name  Route  Sig  Dispense  Refill  . acetaminophen (TYLENOL) 500 MG tablet   Oral   Take 500 mg by mouth every 6 (six) hours as needed for pain.         . diphenhydramine-acetaminophen (TYLENOL PM) 25-500 MG TABS   Oral   Take 1 tablet by mouth daily as needed.         [name] ibuprofen (ADVIL,MOTRIN) 200 MG tablet   Oral   Take 200 mg by mouth every 6 (six) hours as needed for pain.         . polyethylene glycol (MIRALAX / GLYCOLAX) packet   Oral   Take 17 g by mouth daily as needed.          . ciprofloxacin (CIPRO) 500 MG tablet   Oral   Take 1 tablet (500 mg total) by mouth every 12 (twelve) hours.   20 tablet   0   . ibuprofen (ADVIL,MOTRIN) 800 MG tablet   Oral   Take 1 tablet (800 mg total) by mouth 3 (three) times daily.   21 tablet   0   . ondansetron (ZOFRAN) 4 MG tablet   Oral   Take 1 tablet (4 mg total) by mouth every 6 (six) hours.   12 tablet   0    BP 106/71  Pulse 64  Temp(Src) 98 F (36.7 C) (Oral)  Resp 18  SpO2 99% Physical Exam  Constitutional: She is oriented to person, place, and time. She appears well-developed and well-nourished. No distress.  HENT:  Head: Normocephalic and atraumatic.  Mouth/Throat: Oropharynx is clear and moist.  Eyes: Conjunctivae are normal.  Neck: Neck supple.  Cardiovascular: Normal rate, regular rhythm, normal heart sounds and intact distal pulses.   Pulmonary/Chest: Effort normal and breath sounds normal.  Abdominal: Soft. Bowel sounds are normal. She exhibits no distension. There is no tenderness. There is no rebound and no guarding.  Mild R CVA tenderness   Neurological: She is alert and oriented to person, place, and time.  Skin: Skin is warm and dry. She is not diaphoretic.    ED Course   Procedures  (including critical care time)  Labs Reviewed  URINALYSIS, ROUTINE W REFLEX MICROSCOPIC - Abnormal; Notable for the following:    Color, Urine AMBER (*)    APPearance CLOUDY (*)    Specific Gravity, Urine 1.035 (*)    Bilirubin Urine SMALL (*)    Nitrite POSITIVE (*)    Leukocytes, UA SMALL (*)    All other components within normal limits  CBC WITH DIFFERENTIAL - Abnormal; Notable for the following:    Neutrophils Relative % 42 (*)    Lymphocytes Relative 49 (*)    All other components within normal limits  BASIC METABOLIC PANEL - Abnormal; Notable for the following:    GFR calc non Af Amer 75 (*)    GFR calc Af Amer 87 (*)    All other components within normal limits  URINE MICROSCOPIC-ADD ON - Abnormal; Notable for the following:    Squamous Epithelial / LPF FEW (*)    Bacteria, UA FEW (*)    All other components within normal limits  URINE CULTURE  POCT PREGNANCY, URINE   No results found. 1. Cystitis     MDM  Pt w/ chronic abdominal pain. PE reveals minimal R CVA tenderness. Abdomen s/nt/nd w/ bowel sounds, no acute abdomen. Reviewing patient's chart extensive negative work ups including CT scans have been done in past w/o relieving reason for chronic pain. I have reviewed nursing notes, vital signs, and all appropriate lab results for this patient. No concern for acute emergent cause of acute on chronic abdominal pain. Pt give Rocephin in ED and sent home with Cipro. Return precautions discussed. Advised PCP f/u. Patient is agreeable to plan. Patient d/w with Dr. Denton Lank, agrees with plan. Patient is stable at time of discharge       Jeannetta Ellis, PA-C 08/26/12 1613

## 2012-08-26 NOTE — Progress Notes (Signed)
Pt listed without a pcp. CM spoke with her with female at bedside Pt confirms no pcp. Cm discussed importance of pcp services for recurrent symptom management.  Pt voiced understanding.   Pt agreeable to be provided with a list of [employer] [place] PPO providers to assist her with making an appointment and establishing a pcp relationship.    [facility] ED CM spoke with pt on how to obtain an in network pcp with insurance coverage via the customer service number or web site and provided a list of pcps and Gi providers within 5-15 miles radius of [place] code [address]  Cm reviewed ED level of care for crisis/emergent services and community pcp level of care to manage continuous or chronic medical concerns.  The pt voiced understanding CM encouraged pt and discussed pt's responsibility to verify with pt's insurance carrier that any recommended medical provider offered by any emergency room or a hospital provider is within the carrier's network. The pt voiced understanding

## 2012-08-26 NOTE — ED Notes (Signed)
MD at bedside. 

## 2012-08-28 LAB — URINE CULTURE

## 2012-08-28 NOTE — ED Provider Notes (Signed)
Medical screening examination/treatment/procedure(s) were performed by non-physician practitioner and as supervising physician I was immediately available for consultation/collaboration.   Konya Fauble E Nas Wafer, MD 08/28/12 0755 

## 2012-08-29 ENCOUNTER — Telehealth (HOSPITAL_COMMUNITY): Payer: Self-pay | Admitting: Emergency Medicine

## 2012-08-29 NOTE — Progress Notes (Signed)
ED Antimicrobial Stewardship Positive Culture Follow Up   GENNIE EISINGER is an 37 y.o. female who presented to [facility] on 08/26/2012 with a chief complaint of  Chief Complaint  Patient presents with  . Abdominal Pain    Recent Results (from the past 720 hour(s))  URINE CULTURE     Status: None   Collection Time    08/26/12 11:33 AM      Result Value Range Status   Specimen Description URINE, CLEAN CATCH   Final   Special Requests NONE   Final   Culture  Setup Time 08/26/2012 19:49   Final   Colony Count >=100,000 COLONIES/ML   Final   Culture     Final   Value: STAPHYLOCOCCUS SPECIES (COAGULASE NEGATIVE)     Note: RIFAMPIN AND GENTAMICIN SHOULD NOT BE USED AS SINGLE DRUGS FOR TREATMENT OF STAPH INFECTIONS.   Report Status 08/28/2012 FINAL   Final   Organism ID, Bacteria STAPHYLOCOCCUS SPECIES (COAGULASE NEGATIVE)   Final    [x]  Treated with Cipro, organism resistant to prescribed antimicrobial  New antibiotic prescription: Macrobid 100mg  PO BID x 5 days.  Patient should stop taking Cipro.  ED Provider: Junious Silk, PA-C   Sallee Provencal 08/29/2012, 11:04 AM Infectious Diseases Pharmacist Phone# [phone]

## 2012-08-29 NOTE — ED Notes (Signed)
Post ED Visit - Positive Culture Follow-up: Successful Patient Follow-Up  Culture assessed and recommendations reviewed by: []  Wes Dulaney, Pharm.D., BCPS []  Celedonio Miyamoto, [address].D., BCPS []  Georgina Pillion, [address].D., BCPS []  [place], [address].D., BCPS, AAHIVP [x]  Estella Husk, Pharm.D., BCPS, AAHIVP  Positive urine culture  []  Patient discharged without antimicrobial prescription and treatment is now indicated [x]  Organism is resistant to prescribed ED discharge antimicrobial []  Patient with positive blood cultures  Changes discussed with ED provider: Junious Silk PA New antibiotic prescription: Macrobid 100 mg PO BID x five days Called to [facility] [phone]  Contacted patient, date 08/04/014, time 1540   Jiles Harold 08/29/2012, 3:48 PM

## 2012-08-29 NOTE — Telephone Encounter (Signed)
Pt notified of + urine culture and need for change in antibiotic. RX for Macrobid called [facility] [phone] voicemail.

## 2012-08-31 ENCOUNTER — Emergency Department (INDEPENDENT_AMBULATORY_CARE_PROVIDER_SITE_OTHER)
Admission: EM | Admit: 2012-08-31 | Discharge: 2012-08-31 | Disposition: A | Discharge status: Home / Self Care | Payer: Self-pay | Source: Home / Self Care | Attending: Emergency Medicine | Admitting: Emergency Medicine

## 2012-08-31 ENCOUNTER — Encounter (HOSPITAL_COMMUNITY): Payer: Self-pay | Admitting: Emergency Medicine

## 2012-08-31 DIAGNOSIS — Z888 Allergy status to other drugs, medicaments and biological substances status: Secondary | ICD-10-CM

## 2012-08-31 DIAGNOSIS — M549 Dorsalgia, unspecified: Secondary | ICD-10-CM

## 2012-08-31 DIAGNOSIS — Z889 Allergy status to unspecified drugs, medicaments and biological substances status: Secondary | ICD-10-CM

## 2012-08-31 DIAGNOSIS — N390 Urinary tract infection, site not specified: Secondary | ICD-10-CM

## 2012-08-31 LAB — POCT URINALYSIS DIP (DEVICE)
Bilirubin Urine: NEGATIVE
Glucose, UA: NEGATIVE mg/dL
Ketones, ur: NEGATIVE mg/dL
Specific Gravity, Urine: 1.02 (ref 1.005–1.030)
pH: 5.5 (ref 5.0–8.0)

## 2012-08-31 MED ORDER — DIPHENHYDRAMINE HCL 25 MG PO CAPS: 25.0000 mg | ORAL_CAPSULE | Freq: Four times a day (QID) | ORAL | Status: DC | PRN

## 2012-08-31 NOTE — ED Provider Notes (Signed)
CSN: [id number]     Arrival date & time 08/31/12  1235 History     First MD Initiated Contact with Patient 08/31/12 1438     Chief Complaint  Patient presents with  . Urinary Tract Infection   (Consider location/radiation/quality/duration/timing/severity/associated sxs/prior Treatment) HPI Comments: Patient presents urgent care today following instructions from the emergency department flow manage her. Patient was changed to Macrobid ( from Cipro), 2 days ago when they called her informing her that based on urine culture results was most indicated and appropriate to complete this cycle of Macrobid for a urinary tract infection.  Patient describes that she still has some discomfort when she urinates and back pain. She denies any fevers or vomiting and has no pain at rest in her abdomen when asked. Patient describes pain in her lower back. That exacerbates with movement and activity.  Patient describes that after stopped the Cipro and to 1 or 2 doses of Macrobid she started developing a generalized itchy rash that has since then faded some she has taken 5 doses of Macrobid in the rash seems to be fading when this first occurred she called the emergency department she was told to be seen as there was a possibility of having to change antibiotics.  Patient denies any facial swelling difficulty swallowing difficulty breathing the rash is somewhat better although it still itches and still has some residual rash. She also described her urine is looking much clearer and is having less discomfort when she urinates as in decreased pressure.  Patient is a 37 y.o. female presenting with urinary tract infection. The history is provided by the patient.  Urinary Tract Infection This is a new problem. The problem occurs constantly. The problem has been gradually worsening. Pertinent negatives include no chest pain, no abdominal pain, no headaches and no shortness of breath. Exacerbated by: Relation and  movement. Treatments tried: Patient been on Cipro, Macrobid and ibuprofen and sulfur and. The treatment provided moderate relief.    History reviewed. No pertinent past medical history. Past Surgical History  Procedure Laterality Date  . Breast surgery    . Hernia repair    . Nephrectomy    . Cesarean section    . Laser ablation condyloma cervical / vulvar     No family history on file. History  Substance Use Topics  . Smoking status: Never Smoker   . Smokeless tobacco: Never Used  . Alcohol Use: No   OB History   Grav Para Term Preterm Abortions TAB SAB Ect Mult Living                 Review of Systems  Respiratory: Negative for shortness of breath.   Cardiovascular: Negative for chest pain.  Gastrointestinal: Negative for abdominal pain and abdominal distention.  Genitourinary: Positive for dysuria, frequency and difficulty urinating. Negative for urgency, hematuria and flank pain.  Neurological: Negative for headaches.    Allergies  Review of patient's allergies indicates no known allergies.  Home Medications   Current Outpatient Rx  Name  Route  Sig  Dispense  Refill  . ibuprofen (ADVIL,MOTRIN) 800 MG tablet   Oral   Take 1 tablet (800 mg total) by mouth 3 (three) times daily.   21 tablet   0   . nitrofurantoin, macrocrystal-monohydrate, (MACROBID) 100 MG capsule   Oral   Take 100 mg by mouth 2 (two) times daily.         . ondansetron (ZOFRAN) 4 MG tablet   Oral  Take 1 tablet (4 mg total) by mouth every 6 (six) hours.   12 tablet   0   . acetaminophen (TYLENOL) 500 MG tablet   Oral   Take 500 mg by mouth every 6 (six) hours as needed for pain.          BP 117/81  Pulse 69  Temp(Src) 97.6 F (36.4 C) (Oral)  Resp 69  SpO2 99% Physical Exam  Nursing note and vitals reviewed. Constitutional: She appears well-developed and well-nourished. No distress.  Abdominal: Soft. There is no hepatosplenomegaly. There is no tenderness. There is no  rigidity, no rebound, no guarding, no CVA tenderness, no tenderness at McBurney's point and negative Murphy's sign.    Neurological: She is alert.  Skin: No erythema.    ED Course   Procedures (including critical care time)  Labs Reviewed  POCT URINALYSIS DIP (DEVICE) - Abnormal; Notable for the following:    Hgb urine dipstick TRACE (*)    Leukocytes, UA SMALL (*)    All other components within normal limits   No results found. 1. Urinary tract infection   2. Back pain   3. Allergy, drug     MDM  Plan of care:  Problem #1 question allergic reaction- two antibiotic  Patient has been instructed to take Benadryl every 8 hours for the next 48 hours. To be monitoring closely any further symptoms of a potential allergic reaction we made specific note of respiratory symptoms facial swelling or difficulty swallowing. At this point am most suspicious of a reaction to Cipro than Macrobid as she has continued to take Macrobid 48 hours after her first initial rash and it seemed to be fading or improving at this point.  Problem #2 urinary tract infection Urine culture positive for staph aureus greater than 100,000 colonies. Patient was initially switched to Macrobid ACE and sensitivities Today patient is reporting, moderate improvement but still symptomatic To continue with Macrobid to complete the course of 7-10 days Instructed that she needs a urinalysis repeated after 10 days. Instructed patient to return hours go to the emergency department worsening symptoms. Patient has also been provided with verbal and written information to establish a primary care doctor at the [facility].     Jimmie Molly, MD 08/31/12 [phone]

## 2012-08-31 NOTE — ED Notes (Signed)
Chart reviewd

## 2012-08-31 NOTE — ED Notes (Signed)
Pt c/o UTI onset 07/28 --- reports she was seen at [facility] and given antibiotics; was called again and Antibiotics was changed to [facility]; pt started to develop a rash all over and was told to f/u... sxs today include: dysuria, urgency, lower back pain, abd pain... Alert w/no signs of acute distress.

## 2012-11-27 ENCOUNTER — Emergency Department (HOSPITAL_COMMUNITY)
Admission: EM | Admit: 2012-11-27 | Discharge: 2012-11-28 | Disposition: A | Discharge status: Home / Self Care | Payer: BC Managed Care – PPO | Attending: Emergency Medicine | Admitting: Emergency Medicine

## 2012-11-27 ENCOUNTER — Encounter (HOSPITAL_COMMUNITY): Payer: Self-pay | Admitting: Emergency Medicine

## 2012-11-27 DIAGNOSIS — N390 Urinary tract infection, site not specified: Secondary | ICD-10-CM

## 2012-11-27 DIAGNOSIS — K5900 Constipation, unspecified: Secondary | ICD-10-CM | POA: Insufficient documentation

## 2012-11-27 DIAGNOSIS — N760 Acute vaginitis: Secondary | ICD-10-CM | POA: Insufficient documentation

## 2012-11-27 DIAGNOSIS — Z79899 Other long term (current) drug therapy: Secondary | ICD-10-CM | POA: Insufficient documentation

## 2012-11-27 DIAGNOSIS — A499 Bacterial infection, unspecified: Secondary | ICD-10-CM | POA: Insufficient documentation

## 2012-11-27 DIAGNOSIS — Z3202 Encounter for pregnancy test, result negative: Secondary | ICD-10-CM | POA: Insufficient documentation

## 2012-11-27 DIAGNOSIS — B9689 Other specified bacterial agents as the cause of diseases classified elsewhere: Secondary | ICD-10-CM

## 2012-11-27 DIAGNOSIS — R110 Nausea: Secondary | ICD-10-CM | POA: Insufficient documentation

## 2012-11-27 NOTE — ED Notes (Signed)
Pt states sharp consistent pain from navel to right lower back, and down right thigh, with nausea.

## 2012-11-28 LAB — WET PREP, GENITAL
Trich, Wet Prep: NONE SEEN
Yeast Wet Prep HPF POC: NONE SEEN

## 2012-11-28 LAB — CBC WITH DIFFERENTIAL/PLATELET
Basophils Absolute: 0 10*3/uL (ref 0.0–0.1)
Basophils Relative: 1 % (ref 0–1)
Eosinophils Absolute: 0.2 10*3/uL (ref 0.0–0.7)
Eosinophils Relative: 3 % (ref 0–5)
HCT: 38 % (ref 36.0–46.0)
Hemoglobin: 13.5 g/dL (ref 12.0–15.0)
Lymphocytes Relative: 49 % — ABNORMAL HIGH (ref 12–46)
Lymphs Abs: 3.4 10*3/uL (ref 0.7–4.0)
MCH: 32.1 pg (ref 26.0–34.0)
MCHC: 35.5 g/dL (ref 30.0–36.0)
MCV: 90.3 fL (ref 78.0–100.0)
Monocytes Absolute: 0.5 10*3/uL (ref 0.1–1.0)
Monocytes Relative: 7 % (ref 3–12)
Neutro Abs: 2.8 10*3/uL (ref 1.7–7.7)
Neutrophils Relative %: 40 % — ABNORMAL LOW (ref 43–77)
Platelets: 221 10*3/uL (ref 150–400)
RBC: 4.21 MIL/uL (ref 3.87–5.11)
RDW: 12.4 % (ref 11.5–15.5)
WBC: 6.9 10*3/uL (ref 4.0–10.5)

## 2012-11-28 LAB — COMPREHENSIVE METABOLIC PANEL
Alkaline Phosphatase: 52 U/L (ref 39–117)
BUN: 10 mg/dL (ref 6–23)
CO2: 26 mEq/L (ref 19–32)
Chloride: 102 mEq/L (ref 96–112)
Creatinine, Ser: 0.91 mg/dL (ref 0.50–1.10)
GFR calc non Af Amer: 78 mL/min — ABNORMAL LOW (ref >90)
Potassium: 3.9 mEq/L (ref 3.5–5.1)
Total Bilirubin: 0.2 mg/dL — ABNORMAL LOW (ref 0.3–1.2)

## 2012-11-28 LAB — PREGNANCY, URINE: Preg Test, Ur: NEGATIVE

## 2012-11-28 LAB — URINALYSIS, ROUTINE W REFLEX MICROSCOPIC
Bilirubin Urine: NEGATIVE
Glucose, UA: NEGATIVE mg/dL
Hgb urine dipstick: NEGATIVE
Ketones, ur: NEGATIVE mg/dL
Nitrite: NEGATIVE
Protein, ur: NEGATIVE mg/dL
Specific Gravity, Urine: 1.01 (ref 1.005–1.030)
Urobilinogen, UA: 1 mg/dL (ref 0.0–1.0)
pH: 6 (ref 5.0–8.0)

## 2012-11-28 LAB — URINE MICROSCOPIC-ADD ON

## 2012-11-28 LAB — GC/CHLAMYDIA PROBE AMP
CT Probe RNA: NEGATIVE
GC Probe RNA: NEGATIVE

## 2012-11-28 MED ORDER — METRONIDAZOLE 500 MG PO TABS: 500.0000 mg | ORAL_TABLET | Freq: Two times a day (BID) | ORAL | Status: DC

## 2012-11-28 MED ORDER — DEXTROSE 5 % IV SOLN
1.0000 g | Freq: Once | INTRAVENOUS | Status: AC
  Administered 2012-11-28: 1 g via INTRAVENOUS
  Filled 2012-11-28: qty 10

## 2012-11-28 MED ORDER — SODIUM CHLORIDE 0.9 % IV BOLUS (SEPSIS)
1000.0000 mL | Freq: Once | INTRAVENOUS | Status: AC
  Administered 2012-11-28: 1000 mL via INTRAVENOUS

## 2012-11-28 MED ORDER — CEPHALEXIN 500 MG PO CAPS: 500.0000 mg | ORAL_CAPSULE | Freq: Four times a day (QID) | ORAL | Status: DC

## 2012-11-28 NOTE — ED Provider Notes (Signed)
CSN: [id number]     Arrival date & time 11/27/12  2351 History   First MD Initiated Contact with Patient 11/28/12 0005     Chief Complaint  Patient presents with  . Abdominal Pain   (Consider location/radiation/quality/duration/timing/severity/associated sxs/prior Treatment) Patient is a 37 y.o. female presenting with abdominal pain. The history is provided by the patient.  Abdominal Pain Pain location:  Periumbilical Pain quality: sharp   Pain radiation: R thigh. Pain severity:  Moderate Onset quality:  Sudden Duration: worse today, present for past week, on/off for past year total. Timing:  Intermittent Progression:  Worsening Chronicity:  Chronic Context: not recent illness and not trauma   Relieved by:  Acetaminophen and NSAIDs Worsened by:  Nothing tried Associated symptoms: no cough, no fever and no shortness of breath     History reviewed. No pertinent past medical history. Past Surgical History  Procedure Laterality Date  . Breast surgery    . Hernia repair    . Nephrectomy    . Cesarean section    . Laser ablation condyloma cervical / vulvar     No family history on file. History  Substance Use Topics  . Smoking status: Never Smoker   . Smokeless tobacco: Never Used  . Alcohol Use: No   OB History   Grav Para Term Preterm Abortions TAB SAB Ect Mult Living                 Review of Systems  Constitutional: Negative for fever.  Respiratory: Negative for cough and shortness of breath.   Gastrointestinal: Positive for abdominal pain.  All other systems reviewed and are negative.    Allergies  Review of patient's allergies indicates no known allergies.  Home Medications   Current Outpatient Rx  Name  Route  Sig  Dispense  Refill  . acetaminophen (TYLENOL) 500 MG tablet   Oral   Take 500 mg by mouth every 6 (six) hours as needed for pain.         . diphenhydrAMINE (BENADRYL) 25 mg capsule   Oral   Take 1 capsule (25 mg total) by mouth every 6  (six) hours as needed for itching.   15 capsule   0   . ibuprofen (ADVIL,MOTRIN) 800 MG tablet   Oral   Take 1 tablet (800 mg total) by mouth 3 (three) times daily.   21 tablet   0   . nitrofurantoin, macrocrystal-monohydrate, (MACROBID) 100 MG capsule   Oral   Take 100 mg by mouth 2 (two) times daily.         . cephALEXin (KEFLEX) 500 MG capsule   Oral   Take 1 capsule (500 mg total) by mouth 4 (four) times daily.   20 capsule   0   . metroNIDAZOLE (FLAGYL) 500 MG tablet   Oral   Take 1 tablet (500 mg total) by mouth 2 (two) times daily. One po bid x 7 days   14 tablet   0    BP 129/77  Pulse 78  Temp(Src) 98.9 F (37.2 C) (Oral)  Resp 16  Ht 5\' 1"  (1.549 m)  Wt 125 lb 12.8 oz (57.063 kg)  BMI 23.78 kg/m2  SpO2 99% Physical Exam  Nursing note and vitals reviewed. Constitutional: She is oriented to person, place, and time. She appears well-developed and well-nourished. No distress.  HENT:  Head: Normocephalic and atraumatic.  Eyes: EOM are normal. Pupils are equal, round, and reactive to light.  Neck: Normal range of  motion. Neck supple.  Cardiovascular: Normal rate and regular rhythm.  Exam reveals no friction rub.   No murmur heard. Pulmonary/Chest: Effort normal and breath sounds normal. No respiratory distress. She has no wheezes. She has no rales.  Abdominal: Soft. She exhibits no distension. There is no tenderness. There is no rebound.  Musculoskeletal: Normal range of motion. She exhibits no edema.  Neurological: She is alert and oriented to person, place, and time.  Skin: No rash noted. She is not diaphoretic. No pallor.    ED Course  Procedures (including critical care time) Labs Review Labs Reviewed  WET PREP, GENITAL - Abnormal; Notable for the following:    Clue Cells Wet Prep HPF POC MANY (*)    WBC, Wet Prep HPF POC MODERATE (*)    All other components within normal limits  COMPREHENSIVE METABOLIC PANEL - Abnormal; Notable for the following:     Glucose, Bld 110 (*)    Total Bilirubin 0.2 (*)    GFR calc non Af Amer 78 (*)    All other components within normal limits  CBC WITH DIFFERENTIAL - Abnormal; Notable for the following:    Neutrophils Relative % 40 (*)    Lymphocytes Relative 49 (*)    All other components within normal limits  URINALYSIS, ROUTINE W REFLEX MICROSCOPIC - Abnormal; Notable for the following:    APPearance CLOUDY (*)    Leukocytes, UA LARGE (*)    All other components within normal limits  URINE MICROSCOPIC-ADD ON - Abnormal; Notable for the following:    Squamous Epithelial / LPF MANY (*)    All other components within normal limits  GC/CHLAMYDIA PROBE AMP  URINE CULTURE  PREGNANCY, URINE   Imaging Review No results found.  EKG Interpretation   None       MDM   1. UTI (urinary tract infection)   2. BV (bacterial vaginosis)    37 year old female presents with abdominal pain. He has been off and on over the past year. She states this week has gotten worse. Normally abates with ibuprofen or Tylenol, however today is changing caliber. Normal crampy lower abdominal pain, but today as sharp lower starting around the navel and radiating to her pelvis and to her right thigh. She denies any trauma. She's had mild nausea without vomiting. No fevers or diarrhea. She is chronically constipated takes Maalox for that. Last missed her period about 11 years ago. She's not had periods since a cervical ablation. Here vitals are stable. Of note, she has a wrap over her eyes and will not look at me during the exam. Exam with very mild suprapubic pain, no rebound, guarding. No LLQ, RLQ, epigastric, or upper quadrant tenderness. Will pursue pelvic exam. Initial labs without white count.  Pelvic with curd-like white discharge. No CMT, adnexal tenderness. Labs show UTI. Rocephin given. Flagyl given for BV.  Dagmar Hait, MD 11/28/12 [phone]

## 2012-11-28 NOTE — ED Notes (Signed)
Pt reports history of hernias, states she takes tylenol and ibuprofen daily.

## 2012-11-28 NOTE — ED Notes (Signed)
Pt reports nausea with pain from umbilicus to r lower back radiating to r thigh.

## 2012-11-29 LAB — URINE CULTURE: Colony Count: NO GROWTH

## 2012-11-30 ENCOUNTER — Emergency Department (INDEPENDENT_AMBULATORY_CARE_PROVIDER_SITE_OTHER): Payer: BC Managed Care – PPO

## 2012-11-30 ENCOUNTER — Emergency Department (INDEPENDENT_AMBULATORY_CARE_PROVIDER_SITE_OTHER)
Admission: EM | Admit: 2012-11-30 | Discharge: 2012-11-30 | Disposition: A | Discharge status: Home / Self Care | Payer: BC Managed Care – PPO | Source: Home / Self Care | Attending: Emergency Medicine | Admitting: Emergency Medicine

## 2012-11-30 ENCOUNTER — Encounter (HOSPITAL_COMMUNITY): Payer: Self-pay | Admitting: Emergency Medicine

## 2012-11-30 DIAGNOSIS — M5430 Sciatica, unspecified side: Secondary | ICD-10-CM

## 2012-11-30 DIAGNOSIS — M5431 Sciatica, right side: Secondary | ICD-10-CM

## 2012-11-30 DIAGNOSIS — R102 Pelvic and perineal pain: Secondary | ICD-10-CM

## 2012-11-30 LAB — CBC WITH DIFFERENTIAL/PLATELET
Basophils Relative: 0 % (ref 0–1)
Eosinophils Relative: 2 % (ref 0–5)
HCT: 38.2 % (ref 36.0–46.0)
Hemoglobin: 12.9 g/dL (ref 12.0–15.0)
MCH: 30.8 pg (ref 26.0–34.0)
MCHC: 33.8 g/dL (ref 30.0–36.0)
MCV: 91.2 fL (ref 78.0–100.0)
Monocytes Absolute: 0.3 10*3/uL (ref 0.1–1.0)
Monocytes Relative: 7 % (ref 3–12)
Neutro Abs: 2.1 10*3/uL (ref 1.7–7.7)

## 2012-11-30 LAB — POCT I-STAT, CHEM 8
BUN: 6 mg/dL (ref 6–23)
Calcium, Ion: 1.21 mmol/L (ref 1.12–1.23)
HCT: 43 % (ref 36.0–46.0)
Hemoglobin: 14.6 g/dL (ref 12.0–15.0)
TCO2: 23 mmol/L (ref 0–100)

## 2012-11-30 LAB — POCT PREGNANCY, URINE
Preg Test, Ur: NEGATIVE
Preg Test, Ur: NEGATIVE

## 2012-11-30 LAB — POCT URINALYSIS DIP (DEVICE)
Bilirubin Urine: NEGATIVE
Hgb urine dipstick: NEGATIVE
Ketones, ur: NEGATIVE mg/dL
Protein, ur: NEGATIVE mg/dL
Specific Gravity, Urine: 1.02 (ref 1.005–1.030)
pH: 7 (ref 5.0–8.0)

## 2012-11-30 MED ORDER — GABAPENTIN 300 MG PO CAPS: ORAL_CAPSULE | ORAL | Status: DC

## 2012-11-30 MED ORDER — HYDROMORPHONE HCL PF 1 MG/ML IJ SOLN
1.0000 mg | Freq: Once | INTRAMUSCULAR | Status: AC
  Administered 2012-11-30: 1 mg via INTRAMUSCULAR

## 2012-11-30 MED ORDER — KETOROLAC TROMETHAMINE 60 MG/2ML IM SOLN
INTRAMUSCULAR | Status: AC
  Filled 2012-11-30: qty 2

## 2012-11-30 MED ORDER — MELOXICAM 15 MG PO TABS: 15.0000 mg | ORAL_TABLET | Freq: Every day | ORAL | Status: DC

## 2012-11-30 MED ORDER — ONDANSETRON 4 MG PO TBDP
ORAL_TABLET | ORAL | Status: AC
  Filled 2012-11-30: qty 2

## 2012-11-30 MED ORDER — HYDROMORPHONE HCL PF 1 MG/ML IJ SOLN
INTRAMUSCULAR | Status: AC
  Filled 2012-11-30: qty 1

## 2012-11-30 MED ORDER — ONDANSETRON 4 MG PO TBDP
8.0000 mg | ORAL_TABLET | Freq: Once | ORAL | Status: AC
  Administered 2012-11-30: 8 mg via ORAL

## 2012-11-30 MED ORDER — KETOROLAC TROMETHAMINE 60 MG/2ML IM SOLN
60.0000 mg | Freq: Once | INTRAMUSCULAR | Status: AC
  Administered 2012-11-30: 60 mg via INTRAMUSCULAR

## 2012-11-30 MED ORDER — OXYCODONE-ACETAMINOPHEN 5-325 MG PO TABS: ORAL_TABLET | ORAL | Status: DC
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
## 2012-11-30 NOTE — ED Notes (Signed)
Patient in a lot of pain went to get her and she is not undressed yet.

## 2012-11-30 NOTE — ED Notes (Signed)
Pt  Reports  Seen  Er  3  Days  agor  For  uti   -  Pt  Reports  Symptoms  Are  Worse  Today  This  Includes    Back  Pain

## 2013-01-04 ENCOUNTER — Other Ambulatory Visit: Payer: Self-pay | Admitting: Orthopaedic Surgery

## 2013-01-04 DIAGNOSIS — M545 Low back pain: Secondary | ICD-10-CM

## 2013-01-13 ENCOUNTER — Ambulatory Visit
Admission: RE | Admit: 2013-01-13 | Discharge: 2013-01-13 | Disposition: A | Discharge status: Home / Self Care | Payer: BC Managed Care – PPO | Source: Ambulatory Visit | Attending: Orthopaedic Surgery | Admitting: Orthopaedic Surgery

## 2013-01-13 DIAGNOSIS — M545 Low back pain: Secondary | ICD-10-CM

## 2013-01-16 ENCOUNTER — Emergency Department (HOSPITAL_COMMUNITY): Payer: BC Managed Care – PPO

## 2013-01-16 ENCOUNTER — Encounter (HOSPITAL_COMMUNITY): Payer: Self-pay | Admitting: Emergency Medicine

## 2013-01-16 ENCOUNTER — Emergency Department (HOSPITAL_COMMUNITY)
Admission: EM | Admit: 2013-01-16 | Discharge: 2013-01-16 | Disposition: A | Discharge status: Home / Self Care | Payer: BC Managed Care – PPO | Attending: Emergency Medicine | Admitting: Emergency Medicine

## 2013-01-16 DIAGNOSIS — R110 Nausea: Secondary | ICD-10-CM | POA: Insufficient documentation

## 2013-01-16 DIAGNOSIS — Z3202 Encounter for pregnancy test, result negative: Secondary | ICD-10-CM | POA: Insufficient documentation

## 2013-01-16 DIAGNOSIS — F411 Generalized anxiety disorder: Secondary | ICD-10-CM | POA: Insufficient documentation

## 2013-01-16 DIAGNOSIS — R109 Unspecified abdominal pain: Secondary | ICD-10-CM

## 2013-01-16 LAB — CBC WITH DIFFERENTIAL/PLATELET
Eosinophils Absolute: 0 10*3/uL (ref 0.0–0.7)
Eosinophils Relative: 0 % (ref 0–5)
HCT: 38.5 % (ref 36.0–46.0)
Lymphocytes Relative: 20 % (ref 12–46)
Lymphs Abs: 2.2 10*3/uL (ref 0.7–4.0)
MCH: 32.5 pg (ref 26.0–34.0)
MCV: 91.4 fL (ref 78.0–100.0)
Monocytes Absolute: 0.7 10*3/uL (ref 0.1–1.0)
Monocytes Relative: 6 % (ref 3–12)
Platelets: 185 10*3/uL (ref 150–400)
RBC: 4.21 MIL/uL (ref 3.87–5.11)

## 2013-01-16 LAB — COMPREHENSIVE METABOLIC PANEL
BUN: 9 mg/dL (ref 6–23)
CO2: 22 mEq/L (ref 19–32)
Calcium: 9.3 mg/dL (ref 8.4–10.5)
Creatinine, Ser: 0.82 mg/dL (ref 0.50–1.10)
GFR calc Af Amer: >90 mL/min (ref >90)
GFR calc non Af Amer: 89 mL/min — ABNORMAL LOW (ref >90)
Glucose, Bld: 136 mg/dL — ABNORMAL HIGH (ref 70–99)
Potassium: 3.5 mEq/L (ref 3.5–5.1)
Total Protein: 7.8 g/dL (ref 6.0–8.3)

## 2013-01-16 LAB — URINALYSIS, ROUTINE W REFLEX MICROSCOPIC
Bilirubin Urine: NEGATIVE
Hgb urine dipstick: NEGATIVE
Ketones, ur: 15 mg/dL — AB
Nitrite: NEGATIVE
Protein, ur: NEGATIVE mg/dL
pH: 7 (ref 5.0–8.0)

## 2013-01-16 LAB — URINE MICROSCOPIC-ADD ON

## 2013-01-16 LAB — WET PREP, GENITAL
Clue Cells Wet Prep HPF POC: NONE SEEN
Trich, Wet Prep: NONE SEEN
Yeast Wet Prep HPF POC: NONE SEEN

## 2013-01-16 LAB — LIPASE, BLOOD: Lipase: 42 U/L (ref 11–59)

## 2013-01-16 MED ORDER — MORPHINE SULFATE 4 MG/ML IJ SOLN
4.0000 mg | Freq: Once | INTRAMUSCULAR | Status: AC
  Administered 2013-01-16: 4 mg via INTRAVENOUS
  Filled 2013-01-16: qty 1

## 2013-01-16 MED ORDER — ONDANSETRON HCL 4 MG/2ML IJ SOLN: 4.0000 mg | Freq: Once | INTRAMUSCULAR | Status: DC

## 2013-01-16 MED ORDER — OXYCODONE-ACETAMINOPHEN 5-325 MG PO TABS
2.0000 | ORAL_TABLET | Freq: Once | ORAL | Status: AC
  Administered 2013-01-16: 2 via ORAL
  Filled 2013-01-16: qty 2

## 2013-01-16 MED ORDER — SODIUM CHLORIDE 0.9 % IV BOLUS (SEPSIS)
1000.0000 mL | Freq: Once | INTRAVENOUS | Status: AC
  Administered 2013-01-16: 1000 mL via INTRAVENOUS

## 2013-01-16 MED ORDER — IOHEXOL 300 MG/ML  SOLN: 25.0000 mL | INTRAMUSCULAR | Status: AC

## 2013-01-16 MED ORDER — IOHEXOL 300 MG/ML  SOLN
80.0000 mL | Freq: Once | INTRAMUSCULAR | Status: AC | PRN
  Administered 2013-01-16: 80 mL via INTRAVENOUS

## 2013-01-16 MED ORDER — HYDROCODONE-ACETAMINOPHEN 5-325 MG PO TABS: 2.0000 | ORAL_TABLET | ORAL | Status: DC | PRN

## 2013-01-16 MED ORDER — PROMETHAZINE HCL 25 MG/ML IJ SOLN
25.0000 mg | Freq: Once | INTRAMUSCULAR | Status: AC
  Administered 2013-01-16: 25 mg via INTRAVENOUS
  Filled 2013-01-16: qty 1

## 2013-01-16 MED ORDER — ONDANSETRON HCL 4 MG PO TABS: 4.0000 mg | ORAL_TABLET | Freq: Four times a day (QID) | ORAL | Status: DC

## 2013-01-16 NOTE — ED Notes (Signed)
Pt reports suprapubic pain since 4:00 am this morning. Pt reports nausea but no vomiting.

## 2013-01-16 NOTE — ED Provider Notes (Signed)
CSN: [id number]     Arrival date & time 01/16/13  9562 History   First MD Initiated Contact with Patient 01/16/13 [phone]     Chief Complaint  Patient presents with  . Abdominal Pain   (Consider location/radiation/quality/duration/timing/severity/associated sxs/prior Treatment) Patient is a 37 y.o. female presenting with abdominal pain. The history is provided by the patient. No language interpreter was used.  Abdominal Pain Pain location:  Suprapubic Pain quality: sharp   Pain severity:  Moderate Onset quality:  Gradual Associated symptoms: nausea   Associated symptoms: no chest pain, no chills, no cough, no diarrhea, no dysuria, no fever, no hematuria, no shortness of breath, no vaginal bleeding and no vomiting   Nausea:    Severity:  Mild  Pt is a 37 year old female who presents with suprapubic pain and lower abdominal pain. She reports that she woke up this morning hurting over the area of her bladder. She denies fever, chills, vomiting or diarrhea. She does report some mild nausea. 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 She had an [place] at her OB/GYN this past Friday and she said that it showed some scarring. She reports that the pain this morning is more severe than it has been previously. She denies burning, frequency, dysuria or other urinary symptoms. She reports a whitish vaginal discharge but reports this to be her usual. She reports that she has not had a period since her cervical ablation.   History reviewed. No pertinent past medical history. Past Surgical History  Procedure Laterality Date  . Breast surgery    . Hernia repair    . Nephrectomy    . Cesarean section    . Laser ablation condyloma cervical / vulvar     No family history on file. History  Substance Use Topics  . Smoking status: Never Smoker   . Smokeless tobacco: Never Used  . Alcohol Use: No    OB History   Grav Para Term Preterm Abortions TAB SAB Ect Mult Living                 Review of Systems  Constitutional: Negative for fever and chills.  Respiratory: Negative for cough and shortness of breath.   Cardiovascular: Negative for chest pain.  Gastrointestinal: Positive for nausea and abdominal pain. Negative for vomiting and diarrhea.  Genitourinary: Negative for dysuria, hematuria and vaginal bleeding.  Neurological: Negative for dizziness and weakness.  All other systems reviewed and are negative.    Allergies  Review of patient's allergies indicates no known allergies.  Home Medications   Current Outpatient Rx  Name  Route  Sig  Dispense  Refill  . acetaminophen (TYLENOL) 500 MG tablet   Oral   Take 500 mg by mouth every 6 (six) hours as needed for pain.         [name] ibuprofen (ADVIL,MOTRIN) 800 MG tablet   Oral   Take 1 tablet (800 mg total) by mouth 3 (three) times daily.   21 tablet   0    BP 106/65  Pulse 88  Temp(Src) 97.6 F (36.4 C) (Oral)  Resp 18  SpO2 99% Physical Exam  Nursing note and vitals reviewed. Constitutional: She appears well-developed and well-nourished.  HENT:  Head: Normocephalic and atraumatic.  Eyes: Conjunctivae and EOM are normal.  Neck: Normal range of motion. Neck supple. No JVD present. No tracheal deviation present.  No thyromegaly present.  Cardiovascular: Normal rate, regular rhythm, normal heart sounds and intact distal pulses.   Pulmonary/Chest: Effort normal and breath sounds normal. No respiratory distress. She has no wheezes.  Abdominal: Soft. Bowel sounds are normal. There is no hepatosplenomegaly. There is tenderness in the suprapubic area. There is no rigidity, no rebound, no guarding, no CVA tenderness, no tenderness at McBurney's point and negative Murphy's sign.  Genitourinary: There is no rash, tenderness, lesion or injury on the right labia. There is no rash, tenderness, lesion or injury on the left labia.   Musculoskeletal: Normal range of motion.  Lymphadenopathy:    She has no cervical adenopathy.  Neurological: She is alert.  Skin: Skin is warm and dry.  Psychiatric: Her behavior is normal. Judgment and thought content normal. Her mood appears anxious.    ED Course  Procedures (including critical care time) Labs Review Labs Reviewed  URINALYSIS, ROUTINE W REFLEX MICROSCOPIC  PREGNANCY, URINE   Imaging Review No results found.  EKG Interpretation   None       MDM   1. Abdominal pain    Afebrile. Non-toxic in appearance. Suprapubic tenderness. Other wise unremarkable abdominal exam. Well-appearing, non-toxic. Abdomen/pelvis CT; no acute findings. Uterine fibroids. Mild leukocytosis. Metabolic panel unremarkable. Pt agrees to follow-up with Ob/Gyn. Zofran for nausea and Norco prescription for pain. Pt stable for discharge.       Irish Elders, NP 01/19/13 1540

## 2013-01-16 NOTE — ED Notes (Signed)
Called to inform that pt is done drinking contrast

## 2013-01-16 NOTE — ED Notes (Signed)
Pt crying and stating that she is in pain at this time. IV started, with blood draw, informed provider of pt status.

## 2013-01-16 NOTE — ED Notes (Signed)
Pt given oral contrast to drink.

## 2013-01-17 LAB — URINE CULTURE: Colony Count: 50000

## 2013-01-21 NOTE — ED Provider Notes (Signed)
Medical screening examination/treatment/procedure(s) were conducted as a shared visit with non-physician practitioner(s) and myself.  I personally evaluated the patient during the encounter.  EKG Interpretation   None       PT with h/o recurrent abd pain complaints, has had numerous evaluations for abd pain with several CT's since 2010, biliary emptying scan, pelvic U/S and lower back MRI's when I review [facility].  Pt with h/o GYN surgeries in the past, unimpressive pelvic exam as reported by NP Walker.  Pt with mild RLQ tenderness without guarding.  WBC is up 11.1, will get another CT of abdomen to r/o appendicitis.  Clinically suspicion is low, but given pain and tenderness near McBurney's along with elevated WBC, will need CT.  If neg, pt can be discharged to home and instructed on f/u with PCP.    Gavin Pound. Koni Kannan, MD 01/21/13 1350

## 2013-01-31 ENCOUNTER — Other Ambulatory Visit: Payer: Self-pay | Admitting: Obstetrics and Gynecology

## 2013-06-27 ENCOUNTER — Encounter (INDEPENDENT_AMBULATORY_CARE_PROVIDER_SITE_OTHER): Payer: Self-pay | Admitting: General Surgery

## 2013-07-06 ENCOUNTER — Ambulatory Visit (INDEPENDENT_AMBULATORY_CARE_PROVIDER_SITE_OTHER): Payer: BC Managed Care – PPO | Admitting: General Surgery

## 2013-07-06 ENCOUNTER — Encounter (INDEPENDENT_AMBULATORY_CARE_PROVIDER_SITE_OTHER): Payer: Self-pay | Admitting: General Surgery

## 2013-07-06 VITALS — BP 122/61 | HR 70 | Temp 97.6°F | Resp 18 | Ht 61.0 in | Wt 124.8 lb

## 2013-07-06 DIAGNOSIS — IMO0002 Reserved for concepts with insufficient information to code with codable children: Secondary | ICD-10-CM

## 2013-07-06 DIAGNOSIS — S39013A Strain of muscle, fascia and tendon of pelvis, initial encounter: Secondary | ICD-10-CM | POA: Insufficient documentation

## 2013-07-06 NOTE — Progress Notes (Signed)
Patient ID: [name], female   DOB: May 08, 1975, 38 y.o.   MRN: [id number]  Chief Complaint  Patient presents with  . Hernia    HPI [name] is a 38 y.o. female.   HPI 38 yo AAF referred by Dr Gaetano Net for evaluation of right groin pain. She states several weeks ago she had sexual intercourse and that evening she had difficulty lifting her right leg. She says she can barely raise her right leg off the floor. She also had right hip pain and discomfort. About a week later she developed some sensitivity in her right groin. She states that she noticed a little bit of swelling. It wasn't really painful. Then she noticed that the area would become uncomfortable if she was getting in and out of a car or turning a certain way. She also noticed some discomfort in her right groin if she sat for a prolonged period of time. She has chronic constipation. She has a bowel movement every other day or every 2 days with the aid of MiraLAX. She denies any urinary discomfort or hematuria. She denies any melena. She denies any nausea or vomiting. She does describes a discomfort in her groin as a dull nagging sensation. She has had a prior laparoscopic umbilical hernia repair with mesh.  History reviewed. No pertinent past medical history.  Past Surgical History  Procedure Laterality Date  . Breast surgery    . Hernia repair    . Nephrectomy    . Cesarean section    . Laser ablation condyloma cervical / vulvar    . Abdominal hysterectomy      History reviewed. No pertinent family history.  Social History History  Substance Use Topics  . Smoking status: Never Smoker   . Smokeless tobacco: Never Used  . Alcohol Use: No    No Known Allergies  Current Outpatient Prescriptions  Medication Sig Dispense Refill  . acetaminophen (TYLENOL) 500 MG tablet Take 500 mg by mouth every 6 (six) hours as needed for pain.      . Cetirizine HCl (ZYRTEC PO) Take by mouth.      . Montelukast Sodium (SINGULAIR  PO) Take by mouth.      . Polyethylene Glycol 3350 (MIRALAX PO) Take by mouth.       No current facility-administered medications for this visit.    Review of Systems Review of Systems  Constitutional: Negative for fever, activity change, appetite change and unexpected weight change.  HENT: Negative for nosebleeds and trouble swallowing.   Eyes: Negative for photophobia and visual disturbance.  Respiratory: Negative for chest tightness and shortness of breath.   Cardiovascular: Negative for chest pain and leg swelling.       Denies CP, SOB, orthopnea, PND, DOE  Genitourinary: Negative for dysuria and difficulty urinating.  Musculoskeletal: Negative for arthralgias.  Skin: Negative for pallor and rash.  Neurological: Negative for dizziness, seizures, facial asymmetry and numbness.          Hematological: Negative for adenopathy. Does not bruise/bleed easily.  Psychiatric/Behavioral: Negative for behavioral problems and agitation.    Blood pressure 122/61, pulse 70, temperature 97.6 F (36.4 C), temperature source Temporal, resp. rate 18, height 5\' 1"  (1.549 m), weight 124 lb 12.8 oz (56.609 kg).  Physical Exam Physical Exam  Vitals reviewed. Constitutional: She is oriented to person, place, and time. She appears well-developed and well-nourished. No distress.  HENT:  Head: Normocephalic and atraumatic.  Right Ear: External ear normal.  Left Ear: External ear normal.  Eyes: Conjunctivae are normal. No scleral icterus.  Neck: Normal range of motion. Neck supple. No tracheal deviation present. No thyromegaly present.  Cardiovascular: Normal rate and normal heart sounds.   Pulmonary/Chest: Effort normal and breath sounds normal. No stridor. No respiratory distress. She has no wheezes.  Abdominal: Soft. She exhibits no distension. There is no tenderness. There is no rebound and no guarding.  Well-healed trocar sites. Some redundant umbilical skin. No evidence of umbilical hernia  recurrence. While laying supine, no evidence of groin bulge. No evidence of hernia defect the right groin. No bulge with Valsalva or straight leg raise. With the patient standing there is no impulse or bulge or enlarged ring with Valsalva maneuver  Musculoskeletal: She exhibits no edema and no tenderness.  Lymphadenopathy:    She has no cervical adenopathy.  Neurological: She is alert and oriented to person, place, and time. She exhibits normal muscle tone.  Skin: Skin is warm and dry. No rash noted. She is not diaphoretic. No erythema.  Psychiatric: She has a normal mood and affect. Her behavior is normal. Judgment and thought content normal.    Data Reviewed Dr Sherlynn Stalls note Dr. Arlyss Gandy clinic notes and operative note CT scan abdomen pelvis December 2014 Assessment    Right groin pain     Plan    There is no evidence of inguinal hernia with the patient laying supine and or standing with and without Valsalva maneuver. Her symptoms are most consistent with an inguinal strain. We discussed inguinal strain. She was given Neurosurgeon. I recommended a period of avoiding heavy lifting. However I did encourage light cardiovascular exercise. I find that most of the common the discomfort will improve with time however it may take up to 2 months for it to completely go away.   followup as needed  Leighton Ruff. Redmond Pulling, MD, FACS General, Bariatric, & Minimally Invasive Surgery [facility], [place]        [facility] M 07/06/2013, 2:46 PM

## 2013-07-06 NOTE — Patient Instructions (Signed)
Inguinal Strain Your exam shows you have an inguinal strain. This is also known as a pulled groin. This injury is usually due to a pull or partial tear to a muscle or tendon in the groin area. Most groin pulls take several weeks to heal completely. There may be pain with lifting your leg or walking during much of your recovery. Treatment for groin strains includes:  Rest and avoid lifting or performing activities that increase your pain.  Apply ice packs for 20-30 minutes every few hours to reduce pain and swelling over the next 2-3 days.  Medicine to reduce pain and inflammation is often prescribed. HOME CARE INSTRUCTIONS  While most strains in the groin area will heal with rest, you should also watch for any signs of a more serious condition.  SEEK IMMEDIATE MEDICAL CARE IF:   You notice unusual swelling or bulging in the groin.  You have pain or swelling in the testicle.  Blood in your urine.  Marked increased pain.  Weakness or numbness of your leg or abdominal pain. MAKE SURE YOU:   Understand these instructions.  Will watch your condition.  Will get help right away if you are not doing well or get worse. Document Released: 02/20/2004 Document Revised: 04/06/2011 Document Reviewed: 05/19/2007 [facility] Patient Information 2014 [place].

## 2014-02-19 ENCOUNTER — Other Ambulatory Visit: Payer: Self-pay | Admitting: Obstetrics and Gynecology

## 2014-02-20 LAB — CYTOLOGY - PAP

## 2014-03-22 ENCOUNTER — Other Ambulatory Visit: Payer: Self-pay | Admitting: Obstetrics and Gynecology

## 2014-03-22 ENCOUNTER — Ambulatory Visit (INDEPENDENT_AMBULATORY_CARE_PROVIDER_SITE_OTHER): Payer: BLUE CROSS/BLUE SHIELD

## 2014-03-22 ENCOUNTER — Ambulatory Visit (INDEPENDENT_AMBULATORY_CARE_PROVIDER_SITE_OTHER): Payer: BLUE CROSS/BLUE SHIELD | Admitting: Physician Assistant

## 2014-03-22 VITALS — BP 100/78 | HR 82 | Temp 97.6°F | Resp 16 | Ht 61.0 in | Wt 126.0 lb

## 2014-03-22 DIAGNOSIS — F329 Major depressive disorder, single episode, unspecified: Secondary | ICD-10-CM | POA: Insufficient documentation

## 2014-03-22 DIAGNOSIS — M25531 Pain in right wrist: Secondary | ICD-10-CM

## 2014-03-22 DIAGNOSIS — F419 Anxiety disorder, unspecified: Secondary | ICD-10-CM

## 2014-03-22 MED ORDER — MELOXICAM 15 MG PO TABS: 15.0000 mg | ORAL_TABLET | Freq: Every day | ORAL | Status: DC

## 2014-03-22 NOTE — Progress Notes (Signed)
Subjective:   Patient ID: [name], female     DOB: March 21, 1975, 39 y.o.    MRN: [id number]  PCP: No PCP Per Patient  Chief Complaint  Patient presents with  . Wrist Injury    Rt. 1 month ago    Outpatient Prescriptions Prior to Visit  Medication Sig Dispense Refill  . acetaminophen (TYLENOL) 500 MG tablet Take 500 mg by mouth every 6 (six) hours as needed for pain.    . Polyethylene Glycol 3350 (MIRALAX PO) Take by mouth.    . Cetirizine HCl (ZYRTEC PO) Take by mouth.    . Montelukast Sodium (SINGULAIR PO) Take by mouth.     No facility-administered medications prior to visit.    No Known Allergies  Patient Active Problem List   Diagnosis Date Noted  . Anxiety and depression 03/22/2014  . Strain of right inguinal muscle 07/06/2013    Family History  Problem Relation Age of Onset  . Diabetes Father   . Asthma Brother   . Cancer Maternal Grandmother   . Mental illness Mother     anxiety, Bipolar disorder  . Asthma Son   . Diabetes Brother   . Hyperlipidemia Brother   . Asthma Son     History   Social History  . Marital Status: Married    Spouse Name: Raquel Sarna  . Number of Children: 2  . Years of Education: college   Occupational History  . Substitute Teacher     [employer]   Social History Main Topics  . Smoking status: Never Smoker   . Smokeless tobacco: Never Used  . Alcohol Use: No  . Drug Use: No  . Sexual Activity:    Partners: Male    Birth Control/ Protection: Surgical   Other Topics Concern  . Not on file   Social History Narrative   Degree in Social Work from [employer]. Working on [employer] in Clinical biochemist.   Lives with her husband and their two sons.     HPI  One month ago, while comforting a student, she developed pain in the RIGHT wrist when the child fell to the ground while holding her hand. Had swelling and limited movement. Wore a brace on the wrist and iced it BID, used acetaminophen and  initially had some benefit, but symptoms persist.  RIGHT hand dominant.  Pain seems worse, and is constant. Movements increase her pain. "Simple things, like ironing, folding clothes." Keeping it elevated helps reduce the pain.  No numbness or tingling in the hand. Not dropping things, but grasping and lifting, turning, pushing cause pain.  Review of Systems Review of Systems  Musculoskeletal: Positive for joint pain (RIGHT wrist, ulnar aspect).  Skin: no wound, bruising      Objective:  Physical Exam Physical Exam  Constitutional: She is oriented to person, place, and time. She appears well-developed and well-nourished. She is active and cooperative. No distress.  BP 100/78 mmHg  Pulse 82  Temp(Src) 97.6 F (36.4 C) (Oral)  Resp 16  Ht 5\' 1"  (1.549 m)  Wt 126 lb (57.153 kg)  BMI 23.82 kg/m2  SpO2 98%   Eyes: Conjunctivae are normal.  Pulmonary/Chest: Effort normal.  Musculoskeletal:       Right elbow: Normal.      Right wrist: She exhibits tenderness. She exhibits normal range of motion (pain with ulnar deviation, full extension and full flexion, grasping), no bony tenderness and no swelling.       Left wrist: Normal.  Right forearm: Normal.       Left forearm: Normal.       Arms:      Right hand: Normal.       Left hand: Normal.  Neurological: She is alert and oriented to person, place, and time. She has normal strength. No sensory deficit.  Skin: Skin is warm and intact. No rash noted.  Psychiatric: She has a normal mood and affect. Her speech is normal and behavior is normal.    RIGHT Wrist: [facility] reading (PRIMARY) by  Dr. Marin Comment. In general, the wrist is normal, but there is a rounded density at the ulnar aspect of the wrist which could represent a previous avulsion fracture.      Assessment & Plan:  1. Pain, wrist joint, right Possible resolving avulsion fracture. It certainly doesn't look acute, but it's at the location of her pain. She hasn't improved with  appropriate treatment. Add meloxicam, which is a better anti-inflammatory than the acetaminophen. Refer to Hand Surgery for additional evaluation and treatment. - DG Wrist Complete Right; Future - meloxicam (MOBIC) 15 MG tablet; Take 1 tablet (15 mg total) by mouth daily.  Dispense: 30 tablet; Refill: 1 - Ambulatory referral to Hand Surgery   Fara Chute, PA-C Physician Assistant-Certified [facility]

## 2014-03-22 NOTE — Patient Instructions (Signed)
If you have not heard anything regarding the referral in 5 days, please contact our office.

## 2014-04-12 ENCOUNTER — Ambulatory Visit (INDEPENDENT_AMBULATORY_CARE_PROVIDER_SITE_OTHER): Payer: BLUE CROSS/BLUE SHIELD | Admitting: Sports Medicine

## 2014-04-12 VITALS — BP 144/74 | HR 104 | Temp 98.1°F | Resp 16 | Ht 60.5 in | Wt 125.8 lb

## 2014-04-12 DIAGNOSIS — G8929 Other chronic pain: Secondary | ICD-10-CM | POA: Diagnosis not present

## 2014-04-12 DIAGNOSIS — M25531 Pain in right wrist: Secondary | ICD-10-CM

## 2014-04-12 DIAGNOSIS — M25539 Pain in unspecified wrist: Secondary | ICD-10-CM

## 2014-04-12 MED ORDER — DICLOFENAC SODIUM 75 MG PO TBEC: 75.0000 mg | DELAYED_RELEASE_TABLET | Freq: Two times a day (BID) | ORAL | Status: DC

## 2014-04-12 MED ORDER — TRAMADOL HCL 50 MG PO TABS: 50.0000 mg | ORAL_TABLET | Freq: Three times a day (TID) | ORAL | Status: DC | PRN

## 2014-04-12 NOTE — Patient Instructions (Signed)
Use your brace daily. We are going to try to get you in to see Dr. Grandville Silos, hopefully he will have availability sooner Stop the Meloxicam

## 2014-04-12 NOTE — Progress Notes (Signed)
  [name] - 39 y.o. female MRN [id number]  Date of birth: 06-01-1975  SUBJECTIVE: Chief Complaint  Patient presents with  . Wrist Pain    right     HPI:   patient with worsening right ulnar sided pain  No known recent injury  Has increased her activities however  Occasionally keeping her awake at night.  Has tried taking meloxicam with only minimal improvement     ROS: per HPI    HISTORY:  Past Medical, Surgical, Social, and Family History reviewed & updated per EMR.  Pertinent Historical Findings include:  reports that she has never smoked. She has never used smokeless tobacco.  Historical Data Reviewed: X-rays 03/22/2014, right wrist: Prior ulnar styloid fracture without acute component.   OBJECTIVE:  VS:   HT:5' 0.5" (153.7 cm)   WT:125 lb 12.8 oz (57.063 kg)  BMI:24.2          BP:(!) 144/74 mmHg  HR:(!) 104bpm  TEMP:98.1 F (36.7 C)(Oral)  RESP:99 %  Physical Exam  Constitutional: She is well-developed, well-nourished, and in no distress. No distress.  HENT:  Head: Normocephalic and atraumatic.  Eyes: Right eye exhibits no discharge. Left eye exhibits no discharge. No scleral icterus.  Pulmonary/Chest: Effort normal. No respiratory distress.  Musculoskeletal:       Right wrist: She exhibits decreased range of motion and tenderness (over TFCC, nontender over proximal carpal row, Hope ligament, scaphoid.). She exhibits no bony tenderness (Ulnar styloid nontender), no swelling and no crepitus.  Skin: She is not diaphoretic.  Psychiatric: Mood, memory, affect and judgment normal.    ASSESSMENT: 1. Wrist pain, chronic, right   I suspect she may have a TFCC tear especially in the setting of a prior right ulnar styloid fracture. With her increasing activity recently suspect this may have worsened.  PLAN: See problem based charting & AVS for additional documentation.  Immobilization with wrist splint patient are he has, recommend using especially at  night.  New referral placed to Dr. Grandville Silos to see if he has earlier availability  Change to Voltaren, DC meloxicam  Short course of Ultram and discussed this is only temporizing and not intended to be continued. > Return if symptoms worsen or fail to improve.

## 2014-04-13 ENCOUNTER — Telehealth: Payer: Self-pay

## 2014-04-13 NOTE — Telephone Encounter (Signed)
Patient called in on Thursday 04/12/14 at 5:01pm asking for copy of her wrist x-ray for her referral appointment she would like to be called back after 3pm on [phone] and if she does not answer it is ok to leave a message. Needs to sign release form when she picks them up.

## 2014-04-13 NOTE — Telephone Encounter (Signed)
At the patient's request, I copy her  Wrist x ray on CD, and I put it in the pick up drawer. I also notified the patient.

## 2014-04-13 NOTE — Telephone Encounter (Signed)
Request printed and forwarded to x-ray. °

## 2014-09-10 NOTE — Pre-Procedure Instructions (Signed)
    Tinea Nobile  09/10/2014      [id number] [address], [place] [address] Phone: [phone] Fax: [phone]    Your procedure is scheduled on 09/12/14.  Report to [facility] Admitting at 11 A.M.  Call this number if you have problems the morning of surgery:  [phone]   Remember:  Do not eat food or drink liquids after midnight.  Take these medicines the morning of surgery with A SIP OF WATER --tylenol,zoloft,ultram   Do not wear jewelry, make-up or nail polish.  Do not wear lotions, powders, or perfumes.  You may wear deodorant.  Do not shave 48 hours prior to surgery.  Men may shave face and neck.  Do not bring valuables to the hospital.  [facility] is not responsible for any belongings or valuables.  Contacts, dentures or bridgework may not be worn into surgery.  Leave your suitcase in the car.  After surgery it may be brought to your room.  For patients admitted to the hospital, discharge time will be determined by your treatment team.   Special instructions:   Please read over the following fact sheets that you were given. Pain Booklet, Coughing and Deep Breathing and Surgical Site Infection Prevention

## 2014-09-11 ENCOUNTER — Encounter (HOSPITAL_COMMUNITY)
Admission: RE | Admit: 2014-09-11 | Discharge: 2014-09-11 | Disposition: A | Discharge status: Home / Self Care | Payer: BLUE CROSS/BLUE SHIELD | Source: Ambulatory Visit | Attending: Orthopedic Surgery | Admitting: Orthopedic Surgery

## 2014-09-11 ENCOUNTER — Encounter (HOSPITAL_COMMUNITY): Payer: Self-pay

## 2014-09-11 DIAGNOSIS — F419 Anxiety disorder, unspecified: Secondary | ICD-10-CM | POA: Diagnosis not present

## 2014-09-11 DIAGNOSIS — S63591A Other specified sprain of right wrist, initial encounter: Secondary | ICD-10-CM | POA: Diagnosis not present

## 2014-09-11 DIAGNOSIS — F329 Major depressive disorder, single episode, unspecified: Secondary | ICD-10-CM | POA: Diagnosis not present

## 2014-09-11 DIAGNOSIS — Z905 Acquired absence of kidney: Secondary | ICD-10-CM | POA: Diagnosis not present

## 2014-09-11 DIAGNOSIS — K219 Gastro-esophageal reflux disease without esophagitis: Secondary | ICD-10-CM | POA: Diagnosis not present

## 2014-09-11 DIAGNOSIS — Z79899 Other long term (current) drug therapy: Secondary | ICD-10-CM | POA: Diagnosis not present

## 2014-09-11 DIAGNOSIS — Z01818 Encounter for other preprocedural examination: Secondary | ICD-10-CM | POA: Insufficient documentation

## 2014-09-11 DIAGNOSIS — X58XXXA Exposure to other specified factors, initial encounter: Secondary | ICD-10-CM | POA: Diagnosis not present

## 2014-09-11 DIAGNOSIS — M659 Synovitis and tenosynovitis, unspecified: Secondary | ICD-10-CM | POA: Diagnosis not present

## 2014-09-11 HISTORY — DX: Personal history of other diseases of the digestive system: Z87.19

## 2014-09-11 LAB — CBC
HEMATOCRIT: 38.2 % (ref 36.0–46.0)
HEMOGLOBIN: 13.1 g/dL (ref 12.0–15.0)
MCH: 32.2 pg (ref 26.0–34.0)
MCHC: 34.3 g/dL (ref 30.0–36.0)
MCV: 93.9 fL (ref 78.0–100.0)
Platelets: 230 10*3/uL (ref 150–400)
RBC: 4.07 MIL/uL (ref 3.87–5.11)
RDW: 12.5 % (ref 11.5–15.5)
WBC: 5.3 10*3/uL (ref 4.0–10.5)

## 2014-09-11 LAB — BASIC METABOLIC PANEL
Anion gap: 8 (ref 5–15)
BUN: 10 mg/dL (ref 6–20)
CHLORIDE: 106 mmol/L (ref 101–111)
CO2: 25 mmol/L (ref 22–32)
Calcium: 9 mg/dL (ref 8.9–10.3)
Creatinine, Ser: 0.85 mg/dL (ref 0.44–1.00)
GFR calc Af Amer: >60 mL/min (ref >60)
GFR calc non Af Amer: >60 mL/min (ref >60)
GLUCOSE: 84 mg/dL (ref 65–99)
POTASSIUM: 4 mmol/L (ref 3.5–5.1)
Sodium: 139 mmol/L (ref 135–145)

## 2014-09-11 MED ORDER — CHLORHEXIDINE GLUCONATE 4 % EX LIQD
60.0000 mL | CUTANEOUS | Status: AC
  Filled 2014-09-11: qty 60

## 2014-09-11 MED ORDER — CEFAZOLIN SODIUM-DEXTROSE 2-3 GM-% IV SOLR
2.0000 g | INTRAVENOUS | Status: AC
  Filled 2014-09-11: qty 50

## 2014-09-13 ENCOUNTER — Encounter (HOSPITAL_BASED_OUTPATIENT_CLINIC_OR_DEPARTMENT_OTHER): Payer: Self-pay | Admitting: *Deleted

## 2014-09-13 NOTE — Progress Notes (Signed)
NPO AFTER MN WITH EXCEPTION CLEAR LIQUIDS UNTIL 0830 (NO CREAM/ MILK PRODUCTS).  ARRIVE AT 1300.  CURRENT LAB RESULTS IN CHART AND EPIC.

## 2014-09-19 ENCOUNTER — Encounter (HOSPITAL_BASED_OUTPATIENT_CLINIC_OR_DEPARTMENT_OTHER): Payer: Self-pay | Admitting: *Deleted

## 2014-09-19 ENCOUNTER — Ambulatory Visit (HOSPITAL_BASED_OUTPATIENT_CLINIC_OR_DEPARTMENT_OTHER)
Admission: RE | Admit: 2014-09-19 | Discharge: 2014-09-19 | Disposition: A | Discharge status: Home / Self Care | Payer: BLUE CROSS/BLUE SHIELD | Source: Ambulatory Visit | Attending: Orthopedic Surgery | Admitting: Orthopedic Surgery

## 2014-09-19 ENCOUNTER — Ambulatory Visit (HOSPITAL_BASED_OUTPATIENT_CLINIC_OR_DEPARTMENT_OTHER): Payer: BLUE CROSS/BLUE SHIELD | Admitting: Anesthesiology

## 2014-09-19 ENCOUNTER — Encounter (HOSPITAL_BASED_OUTPATIENT_CLINIC_OR_DEPARTMENT_OTHER)
Admission: RE | Disposition: A | Discharge status: Home / Self Care | Payer: Self-pay | Source: Ambulatory Visit | Attending: Orthopedic Surgery

## 2014-09-19 DIAGNOSIS — F419 Anxiety disorder, unspecified: Secondary | ICD-10-CM | POA: Insufficient documentation

## 2014-09-19 DIAGNOSIS — F329 Major depressive disorder, single episode, unspecified: Secondary | ICD-10-CM | POA: Insufficient documentation

## 2014-09-19 DIAGNOSIS — M659 Synovitis and tenosynovitis, unspecified: Secondary | ICD-10-CM | POA: Insufficient documentation

## 2014-09-19 DIAGNOSIS — Z905 Acquired absence of kidney: Secondary | ICD-10-CM | POA: Insufficient documentation

## 2014-09-19 DIAGNOSIS — S63591A Other specified sprain of right wrist, initial encounter: Secondary | ICD-10-CM | POA: Insufficient documentation

## 2014-09-19 DIAGNOSIS — G8929 Other chronic pain: Secondary | ICD-10-CM

## 2014-09-19 DIAGNOSIS — K219 Gastro-esophageal reflux disease without esophagitis: Secondary | ICD-10-CM | POA: Insufficient documentation

## 2014-09-19 DIAGNOSIS — X58XXXA Exposure to other specified factors, initial encounter: Secondary | ICD-10-CM | POA: Insufficient documentation

## 2014-09-19 DIAGNOSIS — Z79899 Other long term (current) drug therapy: Secondary | ICD-10-CM | POA: Insufficient documentation

## 2014-09-19 DIAGNOSIS — M25531 Pain in right wrist: Secondary | ICD-10-CM

## 2014-09-19 HISTORY — PX: WRIST ARTHROSCOPY: SHX838

## 2014-09-19 HISTORY — DX: Unspecified synovitis and tenosynovitis, unspecified forearm: M65.939

## 2014-09-19 HISTORY — DX: Acquired absence of kidney: Z90.5

## 2014-09-19 HISTORY — DX: Synovitis and tenosynovitis, unspecified: M65.9

## 2014-09-19 HISTORY — DX: Other specified sprain of unspecified wrist, initial encounter: S63.599A

## 2014-09-19 HISTORY — DX: Gastro-esophageal reflux disease without esophagitis: K21.9

## 2014-09-19 HISTORY — DX: Other specified postprocedural states: Z98.890

## 2014-09-19 SURGERY — ARTHROSCOPY, WRIST
Anesthesia: General | Site: Wrist | Laterality: Right

## 2014-09-19 MED ORDER — FENTANYL CITRATE (PF) 100 MCG/2ML IJ SOLN
25.0000 ug | INTRAMUSCULAR | Status: DC | PRN
  Administered 2014-09-19 (×2): 25 ug via INTRAVENOUS
  Filled 2014-09-19: qty 1

## 2014-09-19 MED ORDER — FENTANYL CITRATE (PF) 100 MCG/2ML IJ SOLN
INTRAMUSCULAR | Status: DC | PRN
  Administered 2014-09-19: 50 ug via INTRAVENOUS
  Administered 2014-09-19: 100 ug via INTRAVENOUS
  Administered 2014-09-19 (×2): 25 ug via INTRAVENOUS

## 2014-09-19 MED ORDER — PROMETHAZINE HCL 25 MG/ML IJ SOLN
6.2500 mg | INTRAMUSCULAR | Status: DC | PRN
  Filled 2014-09-19: qty 1

## 2014-09-19 MED ORDER — ONDANSETRON HCL 4 MG/2ML IJ SOLN
INTRAMUSCULAR | Status: DC | PRN
  Administered 2014-09-19: 4 mg via INTRAVENOUS

## 2014-09-19 MED ORDER — ACETAMINOPHEN 10 MG/ML IV SOLN
INTRAVENOUS | Status: DC | PRN
  Administered 2014-09-19: 1000 mg via INTRAVENOUS

## 2014-09-19 MED ORDER — HYDROCODONE-ACETAMINOPHEN 5-325 MG PO TABS
1.0000 | ORAL_TABLET | ORAL | Status: DC | PRN
Start: 2014-09-19 — End: 2014-09-19
  Administered 2014-09-19: 1 via ORAL
  Filled 2014-09-19: qty 1

## 2014-09-19 MED ORDER — MIDAZOLAM HCL 2 MG/2ML IJ SOLN
INTRAMUSCULAR | Status: AC
  Filled 2014-09-19: qty 2

## 2014-09-19 MED ORDER — PROPOFOL 10 MG/ML IV BOLUS
INTRAVENOUS | Status: DC | PRN
  Administered 2014-09-19: 200 mg via INTRAVENOUS

## 2014-09-19 MED ORDER — DOCUSATE SODIUM 100 MG PO CAPS: 100.0000 mg | ORAL_CAPSULE | Freq: Two times a day (BID) | ORAL | Status: DC

## 2014-09-19 MED ORDER — MIDAZOLAM HCL 5 MG/5ML IJ SOLN
INTRAMUSCULAR | Status: DC | PRN
  Administered 2014-09-19: 2 mg via INTRAVENOUS

## 2014-09-19 MED ORDER — HYDROCODONE-ACETAMINOPHEN 5-300 MG PO TABS
1.0000 | ORAL_TABLET | Freq: Four times a day (QID) | ORAL | Status: DC | PRN
Start: 2014-09-19 — End: 2017-08-31

## 2014-09-19 MED ORDER — DEXAMETHASONE SODIUM PHOSPHATE 4 MG/ML IJ SOLN
INTRAMUSCULAR | Status: DC | PRN
  Administered 2014-09-19: 10 mg via INTRAVENOUS

## 2014-09-19 MED ORDER — CEFAZOLIN SODIUM-DEXTROSE 2-3 GM-% IV SOLR
2.0000 g | INTRAVENOUS | Status: AC
  Administered 2014-09-19 (×2): 2 g via INTRAVENOUS
  Filled 2014-09-19: qty 50

## 2014-09-19 MED ORDER — LACTATED RINGERS IV SOLN
INTRAVENOUS | Status: DC
Start: 2014-09-19 — End: 2014-09-19
  Administered 2014-09-19 (×2): via INTRAVENOUS
  Filled 2014-09-19: qty 1000

## 2014-09-19 MED ORDER — HYDROCODONE-ACETAMINOPHEN 5-325 MG PO TABS
ORAL_TABLET | ORAL | Status: AC
  Filled 2014-09-19: qty 1

## 2014-09-19 MED ORDER — LIDOCAINE HCL (CARDIAC) 20 MG/ML IV SOLN
INTRAVENOUS | Status: DC | PRN
  Administered 2014-09-19: 50 mg via INTRAVENOUS

## 2014-09-19 MED ORDER — BUPIVACAINE HCL 0.25 % IJ SOLN
INTRAMUSCULAR | Status: DC | PRN
  Administered 2014-09-19: 8 mL

## 2014-09-19 MED ORDER — CHLORHEXIDINE GLUCONATE 4 % EX LIQD
60.0000 mL | Freq: Once | CUTANEOUS | Status: DC
  Filled 2014-09-19: qty 60

## 2014-09-19 MED ORDER — ONDANSETRON HCL 4 MG PO TABS: 4.0000 mg | ORAL_TABLET | Freq: Three times a day (TID) | ORAL | Status: DC | PRN

## 2014-09-19 MED ORDER — EPHEDRINE SULFATE 50 MG/ML IJ SOLN
INTRAMUSCULAR | Status: DC | PRN
  Administered 2014-09-19: 10 mg via INTRAVENOUS

## 2014-09-19 MED ORDER — FENTANYL CITRATE (PF) 100 MCG/2ML IJ SOLN
INTRAMUSCULAR | Status: AC
Start: 2014-09-19 — End: 2014-09-19
  Filled 2014-09-19: qty 4

## 2014-09-19 MED ORDER — CEFAZOLIN SODIUM-DEXTROSE 2-3 GM-% IV SOLR
INTRAVENOUS | Status: AC
  Filled 2014-09-19: qty 50

## 2014-09-19 MED ORDER — FENTANYL CITRATE (PF) 100 MCG/2ML IJ SOLN
INTRAMUSCULAR | Status: AC
  Filled 2014-09-19: qty 2

## 2014-09-19 SURGICAL SUPPLY — 104 items
BANDAGE ELASTIC 3 VELCRO ST LF (GAUZE/BANDAGES/DRESSINGS) ×3 IMPLANT
BANDAGE ELASTIC 4 VELCRO ST LF (GAUZE/BANDAGES/DRESSINGS) ×6 IMPLANT
BLADE CUDA 2.0 (BLADE) IMPLANT
BLADE SURG 11 STRL SS (BLADE) ×3 IMPLANT
BLADE SURG 15 STRL LF DISP TIS (BLADE) ×1 IMPLANT
BLADE SURG 15 STRL SS (BLADE) ×2
BNDG COHESIVE 3X5 TAN STRL LF (GAUZE/BANDAGES/DRESSINGS) IMPLANT
BNDG CONFORM 3 STRL LF (GAUZE/BANDAGES/DRESSINGS) ×3 IMPLANT
BNDG ESMARK 4X9 LF (GAUZE/BANDAGES/DRESSINGS) ×3 IMPLANT
BNDG GAUZE ELAST 4 BULKY (GAUZE/BANDAGES/DRESSINGS) ×3 IMPLANT
CANISTER SUCT 1200ML W/VALVE (MISCELLANEOUS) IMPLANT
CANISTER SUCT LVC 12 LTR MEDI- (MISCELLANEOUS) ×3 IMPLANT
CANISTER SUCTION 2500CC (MISCELLANEOUS) ×3 IMPLANT
CHLORAPREP W/TINT 26ML (MISCELLANEOUS) ×3 IMPLANT
CLOSURE WOUND 1/2 X4 (GAUZE/BANDAGES/DRESSINGS)
CORDS BIPOLAR (ELECTRODE) ×3 IMPLANT
COVER BACK TABLE 60X90IN (DRAPES) ×3 IMPLANT
COVER MAYO STAND STRL (DRAPES) ×3 IMPLANT
CUFF TOURNIQUET SINGLE 18IN (TOURNIQUET CUFF) ×3 IMPLANT
DECANTER SPIKE VIAL GLASS SM (MISCELLANEOUS) IMPLANT
DRAPE EXTREMITY T 121X128X90 (DRAPE) ×3 IMPLANT
DRAPE LG THREE QUARTER DISP (DRAPES) ×3 IMPLANT
DRAPE OEC MINIVIEW 54X84 (DRAPES) ×3 IMPLANT
DRAPE SURG 17X23 STRL (DRAPES) ×3 IMPLANT
DRSG EMULSION OIL 3X3 NADH (GAUZE/BANDAGES/DRESSINGS) IMPLANT
GAUZE SPONGE 4X4 16PLY XRAY LF (GAUZE/BANDAGES/DRESSINGS) IMPLANT
GAUZE XEROFORM 1X8 LF (GAUZE/BANDAGES/DRESSINGS) ×3 IMPLANT
GLOVE BIO SURGEON STRL SZ 6.5 (GLOVE) ×2 IMPLANT
GLOVE BIO SURGEON STRL SZ7.5 (GLOVE) ×3 IMPLANT
GLOVE BIO SURGEON STRL SZ8 (GLOVE) ×3 IMPLANT
GLOVE BIO SURGEONS STRL SZ 6.5 (GLOVE) ×1
GLOVE BIOGEL PI IND STRL 6.5 (GLOVE) ×1 IMPLANT
GLOVE BIOGEL PI IND STRL 7.5 (GLOVE) ×1 IMPLANT
GLOVE BIOGEL PI IND STRL 8.5 (GLOVE) ×1 IMPLANT
GLOVE BIOGEL PI INDICATOR 6.5 (GLOVE) ×2
GLOVE BIOGEL PI INDICATOR 7.5 (GLOVE) ×2
GLOVE BIOGEL PI INDICATOR 8.5 (GLOVE) ×2
GLOVE SURG ORTHO 8.0 STRL STRW (GLOVE) ×3 IMPLANT
GOWN STRL REUS W/ TWL LRG LVL3 (GOWN DISPOSABLE) ×1 IMPLANT
GOWN STRL REUS W/TWL LRG LVL3 (GOWN DISPOSABLE) ×5 IMPLANT
GOWN STRL REUS W/TWL XL LVL3 (GOWN DISPOSABLE) ×3 IMPLANT
MANIFOLD NEPTUNE II (INSTRUMENTS) IMPLANT
NDL SUT 6 .5 CRC .975X.05 MAYO (NEEDLE) IMPLANT
NEEDLE HYPO 20X1 EYE RM 11 (NEEDLE) ×3 IMPLANT
NEEDLE HYPO 22GX1.5 SAFETY (NEEDLE) ×6 IMPLANT
NEEDLE HYPO 25X1 1.5 SAFETY (NEEDLE) IMPLANT
NEEDLE MAYO 6 CRC TAPER PT (NEEDLE) IMPLANT
NEEDLE MAYO TAPER (NEEDLE)
NS IRRIG 1000ML POUR BTL (IV SOLUTION) ×3 IMPLANT
PACK BASIN DAY SURGERY FS (CUSTOM PROCEDURE TRAY) ×3 IMPLANT
PAD CAST 3X4 CTTN HI CHSV (CAST SUPPLIES) ×1 IMPLANT
PAD CAST 4YDX4 CTTN HI CHSV (CAST SUPPLIES) ×2 IMPLANT
PADDING CAST ABS 4INX4YD NS (CAST SUPPLIES) ×2
PADDING CAST ABS COTTON 4X4 ST (CAST SUPPLIES) ×1 IMPLANT
PADDING CAST COTTON 3X4 STRL (CAST SUPPLIES) ×2
PADDING CAST COTTON 4X4 STRL (CAST SUPPLIES) ×4
PASSER SUT SWANSON 36MM LOOP (INSTRUMENTS) IMPLANT
SET SM JOINT TUBING/CANN (CANNULA) ×3 IMPLANT
SLEEVE SCD COMPRESS KNEE MED (MISCELLANEOUS) ×3 IMPLANT
SLING ARM LRG ADULT FOAM STRAP (SOFTGOODS) IMPLANT
SLING ARM MED ADULT FOAM STRAP (SOFTGOODS) IMPLANT
SMITH AND NEPHEW ×3 IMPLANT
SPLINT FAST PLASTER 5X30 (CAST SUPPLIES)
SPLINT FIBERGLASS 3X35 (CAST SUPPLIES) IMPLANT
SPLINT FIBERGLASS 4X30 (CAST SUPPLIES) IMPLANT
SPLINT PLASTER CAST FAST 5X30 (CAST SUPPLIES) IMPLANT
SPLINT PLASTER CAST XFAST 3X15 (CAST SUPPLIES) ×1 IMPLANT
SPLINT PLASTER XTRA FASTSET 3X (CAST SUPPLIES) ×2
SPONGE GAUZE 4X4 12PLY (GAUZE/BANDAGES/DRESSINGS) ×3 IMPLANT
SPONGE GAUZE 4X4 12PLY STER LF (GAUZE/BANDAGES/DRESSINGS) ×3 IMPLANT
STOCKINETTE 4X48 STRL (DRAPES) ×3 IMPLANT
STOCKINETTE SYNTHETIC 3 UNSTER (CAST SUPPLIES) ×3 IMPLANT
STRIP CLOSURE SKIN 1/2X4 (GAUZE/BANDAGES/DRESSINGS) IMPLANT
SUCTION FRAZIER TIP 10 FR DISP (SUCTIONS) IMPLANT
SUT ETHIBOND 3-0 V-5 (SUTURE) IMPLANT
SUT MNCRL AB 3-0 PS2 18 (SUTURE) IMPLANT
SUT MON AB 3-0 SH 27 (SUTURE)
SUT MON AB 3-0 SH27 (SUTURE) IMPLANT
SUT PDS 2 CP NEEDLE XSPECIAL (SUTURE) IMPLANT
SUT PDS AB 2-0 CT2 27 (SUTURE) IMPLANT
SUT PROLENE 3 0 PS 1 (SUTURE) IMPLANT
SUT PROLENE 3 0 PS 2 (SUTURE) IMPLANT
SUT PROLENE 4 0 PS 2 18 (SUTURE) ×3 IMPLANT
SUT VIC AB 0 CT1 27 (SUTURE)
SUT VIC AB 0 CT1 27XBRD ANBCTR (SUTURE) IMPLANT
SUT VIC AB 2-0 PS2 27 (SUTURE) IMPLANT
SUT VIC AB 2-0 SH 27 (SUTURE)
SUT VIC AB 2-0 SH 27XBRD (SUTURE) IMPLANT
SUT VIC AB 3-0 FS2 27 (SUTURE) IMPLANT
SUT VICRYL 4-0 PS2 18IN ABS (SUTURE) IMPLANT
SYR BULB 3OZ (MISCELLANEOUS) ×3 IMPLANT
SYR CONTROL 10ML LL (SYRINGE) ×3 IMPLANT
TAPE SURG TRANSPORE 1 IN (GAUZE/BANDAGES/DRESSINGS) ×1 IMPLANT
TAPE SURGICAL TRANSPORE 1 IN (GAUZE/BANDAGES/DRESSINGS) ×2
TOWEL OR 17X24 6PK STRL BLUE (TOWEL DISPOSABLE) ×6 IMPLANT
TRAP DIGIT (INSTRUMENTS) ×6 IMPLANT
TRAP FINGER LRG (INSTRUMENTS) IMPLANT
TRAY DSU PREP LF (CUSTOM PROCEDURE TRAY) ×3 IMPLANT
TUBE CONNECTING 12'X1/4 (SUCTIONS)
TUBE CONNECTING 12X1/4 (SUCTIONS) IMPLANT
UNDERPAD 30X30 INCONTINENT (UNDERPADS AND DIAPERS) ×3 IMPLANT
WAND 1.5 MICROBLATOR (SURGICAL WAND) ×3 IMPLANT
WATER STERILE IRR 1000ML POUR (IV SOLUTION) IMPLANT
WATER STERILE IRR 500ML POUR (IV SOLUTION) ×3 IMPLANT

## 2014-09-19 NOTE — Anesthesia Preprocedure Evaluation (Signed)
Anesthesia Evaluation  Patient identified by MRN, date of birth, ID band Patient awake    Reviewed: Allergy & Precautions, NPO status , Patient's Chart, lab work & pertinent test results  Airway Mallampati: II  TM Distance: >3 FB Neck ROM: Full    Dental no notable dental hx.    Pulmonary neg pulmonary ROS,  breath sounds clear to auscultation  Pulmonary exam normal       Cardiovascular negative cardio ROS Normal cardiovascular examRhythm:Regular Rate:Normal     Neuro/Psych PSYCHIATRIC DISORDERS Anxiety Depression  Neuromuscular disease    GI/Hepatic Neg liver ROS, hiatal hernia, GERD-  ,  Endo/Other  negative endocrine ROS  Renal/GU negative Renal ROS  negative genitourinary   Musculoskeletal  (+) Arthritis -,   Abdominal   Peds negative pediatric ROS (+)  Hematology negative hematology ROS (+)   Anesthesia Other Findings   Reproductive/Obstetrics negative OB ROS                             Anesthesia Physical Anesthesia Plan  ASA: II  Anesthesia Plan: General   Post-op Pain Management:    Induction: Intravenous  Airway Management Planned: LMA  Additional Equipment:   Intra-op Plan:   Post-operative Plan: Extubation in OR  Informed Consent: I have reviewed the patients History and Physical, chart, labs and discussed the procedure including the risks, benefits and alternatives for the proposed anesthesia with the patient or authorized representative who has indicated his/her understanding and acceptance.   Dental advisory given  Plan Discussed with: CRNA  Anesthesia Plan Comments:         Anesthesia Quick Evaluation

## 2014-09-19 NOTE — Brief Op Note (Signed)
09/19/2014  2:52 PM  PATIENT:  [name]  39 y.o. 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   Delay start of Pharmacological VTE agent (>24hrs) due to surgical blood loss or risk of bleeding: not applicable

## 2014-09-19 NOTE — Discharge Instructions (Signed)
KEEP BANDAGE CLEAN AND DRY CALL OFFICE FOR F/U APPT [phone] IN 2 WEEKS KEEP HAND ELEVATED ABOVE HEART OK TO APPLY ICE TO OPERATIVE AREA CONTACT OFFICE IF ANY WORSENING PAIN OR CONCERNS.         HAND SURGERY    HOME CARE INSTRUCTIONS    The following instructions have been prepared to help you care for yourself upon your return home today.  Wound Care:  Keep your hand elevated above the level of your heart. Do not allow it to dangle by your side. Keep the dressing dry and do not remove it unless your doctor advises you to do so. He will usually change it at the time of you post-op visit. Moving your fingers is advised to stimulate circulation but will depend on the site of your surgery. Of course, if you have a splint applied your doctor will advise you about movement.  Activity:  Do not drive or operate machinery today. Rest today and then you may return to your normal activity and work as indicated by your physician.  Diet: Drink liquids today or eat a light diet. You may resume a regular diet tomorrow.  General expectations: Pain for two or three days. Fingers may become slightly swollen.   Unexpected Observations- Call your doctor if any of these occur: Severe pain not relieved by pain medication. Elevated temperature. Dressing soaked with blood. Inability to move fingers. White or bluish color to fingers.      Post Anesthesia Home Care Instructions  Activity: Get plenty of rest for the remainder of the day. A responsible adult should stay with you for 24 hours following the procedure.  For the next 24 hours, DO NOT: -Drive a car -Paediatric nurse -Drink alcoholic beverages -Take any medication unless instructed by your physician -Make any legal decisions or sign important papers.  Meals: Start with liquid foods such as gelatin or soup. Progress to regular foods as tolerated. Avoid greasy, spicy, heavy foods. If nausea and/or vomiting occur, drink only clear  liquids until the nausea and/or vomiting subsides. Call your physician if vomiting continues.  Special Instructions/Symptoms: Your throat may feel dry or sore from the anesthesia or the breathing tube placed in your throat during surgery. If this causes discomfort, gargle with warm salt water. The discomfort should disappear within 24 hours.  If you had a scopolamine patch placed behind your ear for the management of post- operative nausea and/or vomiting:  1. The medication in the patch is effective for 72 hours, after which it should be removed.  Wrap patch in a tissue and discard in the trash. Wash hands thoroughly with soap and water. 2. You may remove the patch earlier than 72 hours if you experience unpleasant side effects which may include dry mouth, dizziness or visual disturbances. 3. Avoid touching the patch. Wash your hands with soap and water after contact with the patch.

## 2014-09-19 NOTE — Transfer of Care (Signed)
Immediate Anesthesia Transfer of Care Note  Patient: [name]  Procedure(s) Performed: Procedure(s): RIGHT WRIST ARTHROSCOPY AND TFCC ((TRIANGULAR FIBROCARTILAGE COMPLEX) DEBRIDEMENT  (Right)  Patient Location: PACU  Anesthesia Type:General  Level of Consciousness: awake and oriented  Airway & Oxygen Therapy: Patient Spontanous Breathing and Patient connected to nasal cannula oxygen  Post-op Assessment: Report given to RN  Post vital signs: Reviewed and stable  Last Vitals:  Filed Vitals:   09/19/14 1715  BP: 130/70  Pulse: 102  Temp:   Resp: 17    Complications: No apparent anesthesia complications

## 2014-09-19 NOTE — Anesthesia Postprocedure Evaluation (Signed)
  Anesthesia Post-op Note  Patient: [name]  Procedure(s) Performed: Procedure(s) (LRB): RIGHT WRIST ARTHROSCOPY AND TFCC ((TRIANGULAR FIBROCARTILAGE COMPLEX) DEBRIDEMENT  (Right)  Patient Location: PACU  Anesthesia Type: General  Level of Consciousness: awake and alert   Airway and Oxygen Therapy: Patient Spontanous Breathing  Post-op Pain: mild  Post-op Assessment: Post-op Vital signs reviewed, Patient's Cardiovascular Status Stable, Respiratory Function Stable, Patent Airway and No signs of Nausea or vomiting  Last Vitals:  Filed Vitals:   09/19/14 1834  BP: 119/59  Pulse: 85  Temp: 36.4 C  Resp: 16    Post-op Vital Signs: stable   Complications: No apparent anesthesia complications

## 2014-09-19 NOTE — H&P (Signed)
[name] is an 39 y.o. female.   Chief Complaint: right wrist ulnar sided wrist pain HPI: Pt injured her wrist over 16 years ago, reinjured back in January has been having persistent pain in right wrist since her reinjury No prior surgery to right wrist Pt here for surgery today  Past Medical History  Diagnosis Date  . Anxiety   . Depression   . History of hiatal hernia   . 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 GERD (gastroesophageal reflux disease)   . Synovitis of wrist     RIGHT WRIST  . Tear of triangular fibrocartilage complex (TFCC)     RIGHT WRIST  . 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 Cysto/  stent removal/  right accusize retrograde ureteropelvic junction incision and stent replacement  12-05-2001  . Cysto/  right retrograde pyelogram/  right ureteral stent placment   03-01-2002  . Total nephrectomy Right 04-12-2002  . Umbilical hernia repair  12-11-2008    LAPAROSCOPIC  . Leep/  d & c hysteroscopy/  novasure endometrial ablation  08-23-2006  . Benign breast bx  1993  . Cesarean section  2001  & 2003    2003  W/  BILATERAL TUBAL LIGATION  . Laparoscopic assisted vaginal hysterectomy  2014  . Repair left tear duct injury  2013    Family History  Problem Relation Age of Onset  . Diabetes Father   . Asthma Brother   . Cancer Maternal Grandmother   . Mental illness Mother     anxiety, Bipolar disorder  . Asthma Son   . Diabetes Brother   . Hyperlipidemia Brother   . Asthma Son    Social History:  reports that she has never smoked. She has never used smokeless tobacco. She reports that she does not drink alcohol or use illicit drugs.  Allergies: No Known Allergies  Medications Prior to Admission  Medication Sig Dispense Refill  . acetaminophen (TYLENOL) 500  MG tablet Take 1,000 mg by mouth every 6 (six) hours as needed (pain).     . polyethylene glycol (MIRALAX / GLYCOLAX) packet Take 17 g by mouth daily as needed (constipation).    . sertraline (ZOLOFT) 25 MG tablet Take 25 mg by mouth at bedtime.      No results found for this or any previous visit (from the past 48 hour(s)). No results found.  [facility] RECENT ILLNESSES OR HOSPITALIZATIONS  Blood pressure 124/79, pulse 84, temperature 98 F (36.7 C), temperature source Oral, resp. rate 16, height 5\' 1"  (1.549 m), weight 56.246 kg (124 lb), SpO2 100 %. Physical Exam  General Appearance:  Alert, cooperative, no distress, appears stated age  Head:  Normocephalic, without obvious abnormality, atraumatic  Eyes:  Pupils equal, conjunctiva/corneas clear,         Throat: Lips, mucosa, and tongue normal; teeth and gums normal  Neck: No visible masses     Lungs:   respirations unlabored  Chest Wall:  No tenderness or deformity  Heart:  Regular rate and rhythm,  Abdomen:   Soft, non-tender,         Extremities: RIGHT WRIST: TTP OVER DISTAL ULNA FINGERS WARM WELL PERFUSED NO INSTABILITY TO STRESS TESTING OF DRUJ TTP OVER ULNA FOVEA GOOD WRIST AND FOREARM ROTATION  Pulses: 2+ and  symmetric  Skin: Skin color, texture, turgor normal, no rashes or lesions     Neurologic: Normal    Assessment/Plan RIGHT WRIST TFCC INJURY AND ULNAR SIDED WRIST PAIN  RIGHT WRIST ARTHROSCOPY AND JOINT DEBRIDEMENT AND OR REPAIR AND FIXATION AS INDICATED  R/B/A DISCUSSED WITH PT IN OFFICE.  PT VOICED UNDERSTANDING OF PLAN CONSENT SIGNED DAY OF SURGERY PT SEEN AND EXAMINED PRIOR TO OPERATIVE PROCEDURE/DAY OF SURGERY SITE MARKED. QUESTIONS ANSWERED WILL GO HOME FOLLOWING SURGERY WE ARE PLANNING SURGERY FOR YOUR UPPER EXTREMITY. THE RISKS AND BENEFITS OF SURGERY INCLUDE BUT NOT LIMITED TO BLEEDING INFECTION, DAMAGE TO NEARBY NERVES ARTERIES TENDONS, FAILURE OF SURGERY TO ACCOMPLISH ITS INTENDED GOALS, PERSISTENT  SYMPTOMS AND NEED FOR FURTHER SURGICAL INTERVENTION. WITH THIS IN MIND WE WILL PROCEED. I HAVE DISCUSSED WITH THE PATIENT THE PRE AND POSTOPERATIVE REGIMEN AND THE DOS AND DON'TS. PT VOICED UNDERSTANDING AND INFORMED CONSENT SIGNED.  Linna Hoff 09/19/2014, 2:49 PM

## 2014-09-19 NOTE — Anesthesia Procedure Notes (Signed)
Procedure Name: LMA Insertion Date/Time: 09/19/2014 3:00 PM Performed by: Bethena Roys T Pre-anesthesia Checklist: Patient identified, Emergency Drugs available, Suction available and Patient being monitored Patient Re-evaluated:Patient Re-evaluated prior to inductionOxygen Delivery Method: Circle System Utilized Preoxygenation: Pre-oxygenation with 100% oxygen Intubation Type: IV induction Ventilation: Mask ventilation without difficulty LMA: LMA inserted LMA Size: 4.0 Number of attempts: 1 Airway Equipment and Method: Bite block Placement Confirmation: positive ETCO2 Tube secured with: Tape Dental Injury: Teeth and Oropharynx as per pre-operative assessment

## 2014-09-20 ENCOUNTER — Encounter (HOSPITAL_BASED_OUTPATIENT_CLINIC_OR_DEPARTMENT_OTHER): Payer: Self-pay | Admitting: Orthopedic Surgery

## 2014-09-20 NOTE — Op Note (Signed)
[name], [name] NO.:  [id number]  MEDICAL RECORD NO.:  [id number]  LOCATION:  PERIO                        FACILITY:  [facility]  PHYSICIAN:  Linna Hoff IV, M.D.DATE OF BIRTH:  September 10, 1975  DATE OF PROCEDURE:  09/19/2014 DATE OF DISCHARGE:                              OPERATIVE REPORT   PREOPERATIVE DIAGNOSIS:  Right wrist central radial TFCC tear.  POSTOPERATIVE DIAGNOSIS:  Right wrist central radial TFCC tear.  ATTENDING PHYSICIAN:  Linna Hoff, M.D. who has scrubbed and was present for the entire procedure.  ASSISTANT SURGEON:  None.  ANESTHESIA:  General via LMA.  PROCEDURE: 1. Right wrist debridement of central radial TFCC tear. 2. Right distal radioulnar joint arthroscopy and joint debridement and     partial synovectomy.  SURGICAL INDICATIONS:  [name] is a right-hand-dominant female with persistent right-sided ulnar-sided wrist pain.  The patient would like to undergo the above procedure.  Risks, benefits, and alternatives were discussed in detail today and signed informed consent was obtained. Risks include, but not limited to bleeding, infection, damage to nearby nerves, arteries, or tendons, loss of motion in wrist and digits, incomplete relief of symptoms, and need for further surgical intervention.  DESCRIPTION OF PROCEDURE:  The patient was properly identified in the preop holding area, marked with permanent marker made on the right wrist to indicate correct operative site.  The patient was then brought back to the operating room, placed supine on anesthesia room table.  General anesthesia was administered.  The patient tolerated this well.  A well- padded tourniquet was then placed on the right brachium, sealed with 1000 drape.  The right upper extremity was then prepped and draped in normal sterile fashion.  Time-out was called, correct side was identified, and procedure then begun.  Attention was then turned to the right  wrist.  The wrist was then placed on the right wrist arthroscopy tower.  Approximately 5-10 pounds counter traction was then applied. Following this, anatomical portal sites were then marked out.  The joint was inflated with 10 mL of saline solution and the arthroscopy began.  A 3/4 working portal was then established.  Arthroscopy was then introduced into the wrist.  The radioscaphoid capitate short long radiolunate ligaments were in continuity.  The SL ligament was in continuity.  Further inspection was then carried out Ulnarly where the patient did not appear to have any visual deficits in the volar, dorsal, or radioulnar ligaments.  The patient did have the central radial TFCC tear.  This was a degenerative looking tear, it was not amenable to repair, it was on the volar central radial aspect.  The [place] working portal was then established and a probe was then introduced.  Probing the periphery of the TFCC noted to have the ulnar styloid fracture, which was not unstable, and the probing of the peripheral TFCC revealed good superficial and deep components, did not appear to be peripheral or unstable TFCC margins on the periphery.  Once this was carried out, probing of the central radial TFCC repair was then carried out.  A small suction shaver was then carried out and this was debrided back to a stable rim and making  sure not to destabilize the TFCC.  Instrumentation was then reversed.  Further inspection of the LT ligament was in continuity and distal radioulnar joint was then able to be done through the radiocarpal joints.  The patient did have some mild synovitis in the distal radioulnar joint.  The instrumentation was then removed.  The probe was then left in place.  A 4.5 working portal was then established and the arthroscopic shaver was then introduced peeling back the TFCC. The instrumentation will be delivered into the distal radioulnar joint where the arthroscopy was then carried  out to the distal radioulnar joint.  Small suction shavers were done, this was debrided back with the synovitis, and partial synovectomy was then carried out within the DRUJ region.  Once this was also carried out, the micro ablator was then introduced in the lower setting making sure not to over heat joint to avoid any type chondral injury.  The joint was thoroughly lavaged and microcoagulation was then carried out of the synovitis.  Final debridement within the joint was then carried out within the distal radioulnar joint.  Further inspection of the TFCC again did not reveal any instability.  The instrumentation was then removed.  Final images were then obtained.  An 8 mL of 0.25% Marcaine infiltrated in the joint. The skin was then closed using simple 4-0 Prolene sutures.  Xeroform dressing, sterile compressive bandage then applied.  The patient tolerated the procedure well and was then placed in a short-arm volar splint, extubated, and taken to recovery room in good condition.  Intraoperative findings as noted above.  The patient did not have an instability on stress examination under anesthesia, the distal radioulnar joint.  The patient had a good arc of rotation, mild prominence dorsally, but no gross instability.  POSTPROCEDURE PLAN:  The patient discharged to home, seen back in the office in approximately 2 weeks for wound check, suture removal, transition to a short-arm cast for total of 4 weeks immobilization and begin transition to a brace and begin some gentle active range of motion with the use of the wrist.     Melrose Nakayama, M.D.     FWO/MEDQ  D:  09/19/2014  T:  09/20/2014  Job:  [id number]

## 2015-01-17 ENCOUNTER — Emergency Department (HOSPITAL_COMMUNITY): Payer: BLUE CROSS/BLUE SHIELD

## 2015-01-17 ENCOUNTER — Encounter (HOSPITAL_COMMUNITY): Payer: Self-pay | Admitting: Emergency Medicine

## 2015-01-17 ENCOUNTER — Emergency Department (HOSPITAL_COMMUNITY)
Admission: EM | Admit: 2015-01-17 | Discharge: 2015-01-17 | Disposition: A | Discharge status: Home / Self Care | Payer: BLUE CROSS/BLUE SHIELD | Attending: Emergency Medicine | Admitting: Emergency Medicine

## 2015-01-17 DIAGNOSIS — J0110 Acute frontal sinusitis, unspecified: Secondary | ICD-10-CM

## 2015-01-17 DIAGNOSIS — E876 Hypokalemia: Secondary | ICD-10-CM | POA: Insufficient documentation

## 2015-01-17 DIAGNOSIS — F419 Anxiety disorder, unspecified: Secondary | ICD-10-CM | POA: Insufficient documentation

## 2015-01-17 DIAGNOSIS — R000 Tachycardia, unspecified: Secondary | ICD-10-CM | POA: Insufficient documentation

## 2015-01-17 DIAGNOSIS — J309 Allergic rhinitis, unspecified: Secondary | ICD-10-CM | POA: Insufficient documentation

## 2015-01-17 DIAGNOSIS — J209 Acute bronchitis, unspecified: Secondary | ICD-10-CM | POA: Insufficient documentation

## 2015-01-17 DIAGNOSIS — Z8679 Personal history of other diseases of the circulatory system: Secondary | ICD-10-CM | POA: Insufficient documentation

## 2015-01-17 DIAGNOSIS — Z8719 Personal history of other diseases of the digestive system: Secondary | ICD-10-CM | POA: Insufficient documentation

## 2015-01-17 DIAGNOSIS — Z8739 Personal history of other diseases of the musculoskeletal system and connective tissue: Secondary | ICD-10-CM | POA: Insufficient documentation

## 2015-01-17 DIAGNOSIS — R630 Anorexia: Secondary | ICD-10-CM | POA: Insufficient documentation

## 2015-01-17 DIAGNOSIS — Z79899 Other long term (current) drug therapy: Secondary | ICD-10-CM | POA: Insufficient documentation

## 2015-01-17 DIAGNOSIS — F329 Major depressive disorder, single episode, unspecified: Secondary | ICD-10-CM | POA: Insufficient documentation

## 2015-01-17 LAB — CBC WITH DIFFERENTIAL/PLATELET
BASOS ABS: 0 10*3/uL (ref 0.0–0.1)
BASOS PCT: 0 %
EOS ABS: 0.1 10*3/uL (ref 0.0–0.7)
EOS PCT: 1 %
HEMATOCRIT: 39.2 % (ref 36.0–46.0)
HEMOGLOBIN: 13.2 g/dL (ref 12.0–15.0)
Lymphocytes Relative: 18 %
Lymphs Abs: 1.6 10*3/uL (ref 0.7–4.0)
MCH: 31.7 pg (ref 26.0–34.0)
MCHC: 33.7 g/dL (ref 30.0–36.0)
MCV: 94 fL (ref 78.0–100.0)
MONO ABS: 1 10*3/uL (ref 0.1–1.0)
Monocytes Relative: 12 %
Neutro Abs: 6.1 10*3/uL (ref 1.7–7.7)
Neutrophils Relative %: 69 %
Platelets: 187 10*3/uL (ref 150–400)
RBC: 4.17 MIL/uL (ref 3.87–5.11)
RDW: 12.7 % (ref 11.5–15.5)
WBC: 8.8 10*3/uL (ref 4.0–10.5)

## 2015-01-17 LAB — I-STAT TROPONIN, ED: Troponin i, poc: 0 ng/mL (ref 0.00–0.08)

## 2015-01-17 LAB — BASIC METABOLIC PANEL
Anion gap: 10 (ref 5–15)
BUN: 8 mg/dL (ref 6–20)
CHLORIDE: 103 mmol/L (ref 101–111)
CO2: 26 mmol/L (ref 22–32)
CREATININE: 0.84 mg/dL (ref 0.44–1.00)
Calcium: 9.3 mg/dL (ref 8.9–10.3)
GFR calc Af Amer: >60 mL/min (ref >60)
GFR calc non Af Amer: >60 mL/min (ref >60)
GLUCOSE: 107 mg/dL — AB (ref 65–99)
POTASSIUM: 3.1 mmol/L — AB (ref 3.5–5.1)
SODIUM: 139 mmol/L (ref 135–145)

## 2015-01-17 LAB — BRAIN NATRIURETIC PEPTIDE: B NATRIURETIC PEPTIDE 5: 17.1 pg/mL (ref 0.0–100.0)

## 2015-01-17 MED ORDER — ONDANSETRON 4 MG PO TBDP: 4.0000 mg | ORAL_TABLET | Freq: Three times a day (TID) | ORAL | Status: DC | PRN

## 2015-01-17 MED ORDER — CETIRIZINE HCL 10 MG PO TABS: 10.0000 mg | ORAL_TABLET | Freq: Every day | ORAL | Status: DC

## 2015-01-17 MED ORDER — IPRATROPIUM-ALBUTEROL 0.5-2.5 (3) MG/3ML IN SOLN
3.0000 mL | Freq: Once | RESPIRATORY_TRACT | Status: AC
  Administered 2015-01-17: 3 mL via RESPIRATORY_TRACT
  Filled 2015-01-17: qty 3

## 2015-01-17 MED ORDER — METOCLOPRAMIDE HCL 5 MG/ML IJ SOLN
10.0000 mg | Freq: Once | INTRAMUSCULAR | Status: AC
  Administered 2015-01-17: 10 mg via INTRAVENOUS
  Filled 2015-01-17: qty 2

## 2015-01-17 MED ORDER — FLUTICASONE PROPIONATE 50 MCG/ACT NA SUSP: 2.0000 | Freq: Every day | NASAL | Status: DC

## 2015-01-17 MED ORDER — KETOROLAC TROMETHAMINE 30 MG/ML IJ SOLN
30.0000 mg | Freq: Once | INTRAMUSCULAR | Status: AC
  Administered 2015-01-17: 30 mg via INTRAVENOUS
  Filled 2015-01-17: qty 1

## 2015-01-17 MED ORDER — DIPHENHYDRAMINE HCL 50 MG/ML IJ SOLN
25.0000 mg | Freq: Once | INTRAMUSCULAR | Status: AC
  Administered 2015-01-17: 25 mg via INTRAVENOUS
  Filled 2015-01-17: qty 1

## 2015-01-17 MED ORDER — POTASSIUM CHLORIDE CRYS ER 20 MEQ PO TBCR
40.0000 meq | EXTENDED_RELEASE_TABLET | Freq: Once | ORAL | Status: AC
  Administered 2015-01-17: 40 meq via ORAL
  Filled 2015-01-17: qty 2

## 2015-01-17 MED ORDER — AZITHROMYCIN 250 MG PO TABS: 250.0000 mg | ORAL_TABLET | Freq: Every day | ORAL | Status: DC

## 2015-01-17 MED ORDER — PREDNISONE 20 MG PO TABS: 40.0000 mg | ORAL_TABLET | Freq: Every day | ORAL | Status: DC

## 2015-01-17 MED ORDER — SODIUM CHLORIDE 0.9 % IV BOLUS (SEPSIS)
1000.0000 mL | Freq: Once | INTRAVENOUS | Status: AC
  Administered 2015-01-17: 1000 mL via INTRAVENOUS

## 2015-01-17 MED ORDER — PREDNISONE 20 MG PO TABS
60.0000 mg | ORAL_TABLET | Freq: Once | ORAL | Status: AC
  Administered 2015-01-17: 60 mg via ORAL
  Filled 2015-01-17: qty 3

## 2015-01-17 MED ORDER — BENZONATATE 100 MG PO CAPS
200.0000 mg | ORAL_CAPSULE | Freq: Once | ORAL | Status: AC
  Administered 2015-01-17: 200 mg via ORAL
  Filled 2015-01-17: qty 2

## 2015-01-17 MED ORDER — ALBUTEROL SULFATE (2.5 MG/3ML) 0.083% IN NEBU
5.0000 mg | INHALATION_SOLUTION | Freq: Once | RESPIRATORY_TRACT | Status: AC
  Administered 2015-01-17: 5 mg via RESPIRATORY_TRACT
  Filled 2015-01-17: qty 6

## 2015-01-17 MED ORDER — GUAIFENESIN-CODEINE 100-10 MG/5ML PO SOLN
5.0000 mL | Freq: Three times a day (TID) | ORAL | Status: DC | PRN
Start: 2015-01-17 — End: 2017-08-31

## 2015-01-17 NOTE — ED Notes (Signed)
INITIAL ASSESSMENT COMPLETED. PT C/O PRODUCTIVE COUGH, SOB, FEVER, AND BODY ACHES X2 DAYS. AWAITING FURTHER ORDERS.

## 2015-01-17 NOTE — ED Notes (Signed)
Nurse drawing labs. 

## 2015-01-17 NOTE — ED Provider Notes (Signed)
CSN: [id number]     Arrival date & time 01/17/15  1735 History   First MD Initiated Contact with Patient 01/17/15 2010     Chief Complaint  Patient presents with  . Shortness of Breath  . Nasal Congestion  . Laryngitis     (Consider location/radiation/quality/duration/timing/severity/associated sxs/prior Treatment) Patient is a 39 y.o. female presenting with shortness of breath and URI. The history is provided by the patient.  Shortness of Breath Associated symptoms: cough, fever, headaches and sore throat   Associated symptoms: no diaphoresis, no ear pain and no wheezing   URI Presenting symptoms: congestion, cough, facial pain, fatigue, fever, rhinorrhea and sore throat   Presenting symptoms: no ear pain   Associated symptoms: headaches   Associated symptoms: no sneezing and no wheezing     Patient is a 39 year old female with history of seasonal allergies, anxiety and depression, presents to emergency room with 4 days of cold and cough symptoms at this rapidly worsened causing facial pain, headaches, congestion, frequent cough, shortness of breath, fever, fatigue, sore throat, and chest pain described as "someone standing on her chest."  She does have chest wall tenderness with coughing and with palpation, but she also has worsening chest pain described as a tightness and pressure was is worsened with deep inspiration. Currently rated 8/10 in severity, without radiation and located to her superior sternum and bilateral upper anterior chest below her clavicles She endorses orthopnea and rapid heart rate, denies any lower extremity edema, PND. She states that she has also had a hoarse voice and lightheadedness with positional changes. With ambulation she feels weak and intermittently lightheaded, but she denies any exertional dyspnea and states that her chest will burn more with ambulation.  Her cough is productive with clear to yellow sputum. She states her next temperatures 101. She denies  any night sweats or chills. She denies abdominal pain, nausea, vomiting or diarrhea however she has lost her appetite and states she has not eaten for the past 4 days.  She denies sick contacts.  She states that she has seasonal allergies which she uses Benadryl periodically for. She has reports using Tylenol Cold and flu without any relief of her symptoms.  She reports facial pain over both of her cheeks and a headache across her forehead and behind her eyes with a throbbing quality with associated photosensitivity.  She states she is history of migraines and this is slightly worse than some of her worst migraines. She denies personal cancer history, history of recent travel out of the country, recent travel by plane or prolonged car ride.  She does not have any smoking history, pulmonary disease or history of asthma.  She is not on any oral birth control.    Past Medical History  Diagnosis Date  . Anxiety   . Depression   . History of hiatal hernia   . 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 GERD (gastroesophageal reflux disease)   . Synovitis of wrist     RIGHT WRIST  . Tear of triangular fibrocartilage complex (TFCC)     RIGHT WRIST  . 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 Cysto/  stent removal/  right accusize retrograde ureteropelvic junction incision and stent replacement  12-05-2001  . Cysto/  right retrograde pyelogram/  right ureteral  stent placment   03-01-2002  . Total nephrectomy Right 04-12-2002  . Umbilical hernia repair  12-11-2008    LAPAROSCOPIC  . Leep/  d & c hysteroscopy/  novasure endometrial ablation  08-23-2006  . Benign breast bx  1993  . Cesarean section  2001  & 2003    2003  W/  BILATERAL TUBAL LIGATION  . Laparoscopic assisted vaginal hysterectomy  2014  . Repair  left tear duct injury  2013  . Wrist arthroscopy Right 09/19/2014    Procedure: RIGHT WRIST ARTHROSCOPY AND TFCC ((TRIANGULAR FIBROCARTILAGE COMPLEX) DEBRIDEMENT ;  Surgeon: Iran Planas, MD;  Location: [facility];  Service: Orthopedics;  Laterality: Right;   Family History  Problem Relation Age of Onset  . Diabetes Father   . Asthma Brother   . Cancer Maternal Grandmother   . Mental illness Mother     anxiety, Bipolar disorder  . Asthma Son   . Diabetes Brother   . Hyperlipidemia Brother   . Asthma Son    Social History  Substance Use Topics  . Smoking status: Never Smoker   . Smokeless tobacco: Never Used  . Alcohol Use: No   OB History    No data available     Review of Systems  Constitutional: Positive for fever, appetite change and fatigue. Negative for chills and diaphoresis.  HENT: Positive for congestion, postnasal drip, rhinorrhea, sinus pressure, sore throat and voice change. Negative for ear pain, sneezing and trouble swallowing.   Eyes: Negative.   Respiratory: Positive for cough, chest tightness and shortness of breath. Negative for apnea, choking, wheezing and stridor.   Gastrointestinal: Negative.   Endocrine: Negative.   Genitourinary: Negative.   Musculoskeletal: Negative.   Skin: Negative.   Neurological: Positive for weakness, light-headedness and headaches. Negative for dizziness, seizures, syncope, facial asymmetry and numbness.  All other systems reviewed and are negative.     Allergies  Review of patient's allergies indicates no known allergies.  Home Medications   Prior to Admission medications   Medication Sig Start Date End Date Taking? Authorizing Provider  acetaminophen (TYLENOL) 500 MG tablet Take 1,000 mg by mouth every 6 (six) hours as needed.   Yes Historical Provider, MD  diphenhydrAMINE (BENADRYL) 25 MG tablet Take 50 mg by mouth every 6 (six) hours as needed for allergies.   Yes Historical Provider, MD   Phenylephrine-DM-GG-APAP (TYLENOL COLD/FLU SEVERE) 5-10-200-325 MG TABS Take 2 tablets by mouth 2 (two) times daily as needed (cold symptoms).   Yes Historical Provider, MD  polyethylene glycol (MIRALAX / GLYCOLAX) packet Take 17 g by mouth daily as needed (constipation). Reported on 01/17/2015   Yes Historical Provider, MD  sertraline (ZOLOFT) 50 MG tablet Take 50 mg by mouth daily. 12/23/14  Yes Historical Provider, MD  azithromycin (ZITHROMAX) 250 MG tablet Take 1 tablet (250 mg total) by mouth daily. Take first 2 tablets together, then 1 every day until finished. 01/17/15   Delsa Grana, PA-C  cetirizine (ZYRTEC ALLERGY) 10 MG tablet Take 1 tablet (10 mg total) by mouth daily. 01/17/15   Delsa Grana, PA-C  docusate sodium (COLACE) 100 MG capsule Take 1 capsule (100 mg total) by mouth 2 (two) times daily. 09/19/14   Iran Planas, MD  fluticasone (FLONASE) 50 MCG/ACT nasal spray Place 2 sprays into both nostrils daily. 01/17/15   Delsa Grana, PA-C  guaiFENesin-codeine 100-10 MG/5ML syrup Take 5 mLs by mouth 3 (three) times daily as needed for cough. 01/17/15   Delsa Grana, PA-C  Hydrocodone-Acetaminophen (VICODIN) 5-300 MG TABS Take 1 tablet by mouth 4 (four) times daily as needed (PAIN). 09/19/14   Iran Planas, MD  ondansetron (ZOFRAN) 4 MG tablet Take 1 tablet (4 mg total) by mouth every 8 (eight) hours as needed for nausea or vomiting. 09/19/14   Iran Planas, MD  predniSONE (DELTASONE) 20 MG tablet Take 2 tablets (40 mg total) by mouth daily. 01/17/15   Delsa Grana, PA-C   BP 122/86 mmHg  Pulse 107  Temp(Src) 98.9 F (37.2 C) (Oral)  Resp 18  SpO2 100% Physical Exam  Constitutional: She is oriented to person, place, and time. She appears well-developed and well-nourished. No distress.  HENT:  Head: Normocephalic and atraumatic.  Right Ear: External ear normal.  Left Ear: External ear normal.  Nasal mucosa boggy and pale with clear discharge, tenderness palpation of her maxillary and  frontal sinuses, posterior oropharynx erythematous without exudate, uvula midline without edema, oral mucosa dry, no cervical lymphadenopathy  Eyes: Conjunctivae and EOM are normal. Pupils are equal, round, and reactive to light. Right eye exhibits no discharge. Left eye exhibits no discharge. No scleral icterus.  Neck: Normal range of motion. Neck supple. No JVD present. No tracheal deviation present. No thyromegaly present.  Cardiovascular: Normal rate, regular rhythm, normal heart sounds and intact distal pulses.  Exam reveals no gallop and no friction rub.   No murmur heard. No lower extremity edema  Pulmonary/Chest: Effort normal. No respiratory distress. She has no wheezes. She has no rales. She exhibits tenderness.  BS diminished and coarse throughout, no wheeze, rales, or rhonchi Anterior, superior chest well TTP Frequent cough No respiratory distress  Abdominal: Soft. Bowel sounds are normal. She exhibits no distension and no mass. There is no tenderness. There is no rebound and no guarding.  Musculoskeletal: Normal range of motion. She exhibits no edema or tenderness.  Lymphadenopathy:    She has no cervical adenopathy.  Neurological: She is alert and oriented to person, place, and time. She has normal reflexes. No cranial nerve deficit. She exhibits normal muscle tone. Coordination normal.  Skin: Skin is warm and dry. No rash noted. She is not diaphoretic. No erythema. No pallor.  Psychiatric: She has a normal mood and affect. Her behavior is normal. Judgment and thought content normal.  Nursing note and vitals reviewed.   ED Course  Procedures (including critical care time) Labs Review Labs Reviewed  BASIC METABOLIC PANEL - Abnormal; Notable for the following:    Potassium 3.1 (*)    Glucose, Bld 107 (*)    All other components within normal limits  RAPID STREP SCREEN (NOT AT [facility])  CBC WITH DIFFERENTIAL/PLATELET  BRAIN NATRIURETIC PEPTIDE  I-STAT TROPOININ, ED     Imaging Review Dg Chest 2 View  01/17/2015  CLINICAL DATA:  Nasal congestion and shortness of breath for 2 days. EXAM: CHEST  2 VIEW COMPARISON:  10/24/2009 FINDINGS: Few densities in the right lower chest may represent atelectasis. There is no significant airspace disease or consolidation. Heart and mediastinum are within normal limits. Trachea is midline. No large pleural effusions. Bony thorax is intact. IMPRESSION: No active cardiopulmonary disease. Electronically Signed   By: Markus Daft M.D.   On: 01/17/2015 19:27   I have personally reviewed and evaluated these images and lab results as part of my medical decision-making.   EKG Interpretation None      MDM   Patient with URI symptoms, cough, facial pain, sinus pressure, headache Patient was afebrile at presentation but  mildly tachycardic, normal pulse ox She reports feeling very short of breath, worse today with wheeze, hoarse voice, orthopnea, chest pressure and tightness The patient does not have any lower extremity edema, no cardiac history, do not suspect CHF Patient also has no risk factors for PE, she is unable to be ruled out with PERC, due to mild tachycardia however suspect this is from dehydration as the patient has not eaten or drank in 4 days and she endorses generalized weakness and lightheadedness when standing.  The patient was given IV fluids and also multiple breathing treatments in the ER.  I suspect fluids would've improved her heart rate however albuterol would've increased and she has currently HR of 107.  Headache was treated with reglan, benadryl and toradol, suspect IVF will also improve HA.  Patient reports improvement of her chest pressure with breathing treatments. Basic labs and cardiac workup with BNP are unremarkable with 0.00 troponin, new leukocytosis, a BNP, chest x-ray reveals diffuse densities the right lower chest, no pleural effusion, no pneumothorax, no pulmonary edema.    Patient will be  treated for acute sinusitis and bronchitis with z-pak, steroids, albuterol inhaler, cough suppressants and expectorants.  The patient also has nasal mucosa appears consistent with allergic rhinitis, she is also prescribed Flonase and Zyrtec.    Mild hyperkalemia in the ER today he was repleted with oral potassium, instructions provided regarding foods to replete potassium. No concern for persistent potassium loss.  Pt was discharged home in improved condition, VSS Filed Vitals:   01/17/15 2100 01/17/15 2121 01/17/15 2143 01/17/15 2316  BP: 120/67  122/86 112/62  Pulse: 112  107 99  Temp:    98.7 F (37.1 C)  TempSrc:    Oral  Resp:   18 18  SpO2: 100% 100% 100% 97%     Final diagnoses:  Acute frontal sinusitis, recurrence not specified  Acute bronchitis, unspecified organism  Allergic rhinitis, unspecified allergic rhinitis type      Delsa Grana, PA-C 01/24/15 0227  Daleen Bo, MD 01/25/15 1154

## 2015-01-17 NOTE — ED Notes (Signed)
Called Respiratory to make aware pt has neb treatment ordered.  Respiratory tech stated, "I am in room 18 right now so will be a little while".  When asked if want RN to administer, got no response and call ended.

## 2015-01-17 NOTE — ED Notes (Signed)
PT DISCHARGED. INSTRUCTIONS AND PRESCRIPTIONS GIVEN. AAOX3. PT IN NO APPARENT DISTRESS OR PAIN. THE OPPORTUNITY TO ASK QUESTIONS WAS PROVIDED. 

## 2015-01-17 NOTE — Discharge Instructions (Signed)
Acute Bronchitis Bronchitis is inflammation of the airways that extend from the windpipe into the lungs (bronchi). The inflammation often causes mucus to develop. This leads to a cough, which is the most common symptom of bronchitis.  In acute bronchitis, the condition usually develops suddenly and goes away over time, usually in a couple weeks. Smoking, allergies, and asthma can make bronchitis worse. Repeated episodes of bronchitis may cause further lung problems.  CAUSES Acute bronchitis is most often caused by the same virus that causes a cold. The virus can spread from person to person (contagious) through coughing, sneezing, and touching contaminated objects. SIGNS AND SYMPTOMS   Cough.   Fever.   Coughing up mucus.   Body aches.   Chest congestion.   Chills.   Shortness of breath.   Sore throat.  DIAGNOSIS  Acute bronchitis is usually diagnosed through a physical exam. Your health care provider will also ask you questions about your medical history. Tests, such as chest X-rays, are sometimes done to rule out other conditions.  TREATMENT  Acute bronchitis usually goes away in a couple weeks. Oftentimes, no medical treatment is necessary. Medicines are sometimes given for relief of fever or cough. Antibiotic medicines are usually not needed but may be prescribed in certain situations. In some cases, an inhaler may be recommended to help reduce shortness of breath and control the cough. A cool mist vaporizer may also be used to help thin bronchial secretions and make it easier to clear the chest.  HOME CARE INSTRUCTIONS  Get plenty of rest.   Drink enough fluids to keep your urine clear or pale yellow (unless you have a medical condition that requires fluid restriction). Increasing fluids may help thin your respiratory secretions (sputum) and reduce chest congestion, and it will prevent dehydration.   Take medicines only as directed by your health care provider.  If  you were prescribed an antibiotic medicine, finish it all even if you start to feel better.  Avoid smoking and secondhand smoke. Exposure to cigarette smoke or irritating chemicals will make bronchitis worse. If you are a smoker, consider using nicotine gum or skin patches to help control withdrawal symptoms. Quitting smoking will help your lungs heal faster.   Reduce the chances of another bout of acute bronchitis by washing your hands frequently, avoiding people with cold symptoms, and trying not to touch your hands to your mouth, nose, or eyes.   Keep all follow-up visits as directed by your health care provider.  SEEK MEDICAL CARE IF: Your symptoms do not improve after 1 week of treatment.  SEEK IMMEDIATE MEDICAL CARE IF:  You develop an increased fever or chills.   You have chest pain.   You have severe shortness of breath.  You have bloody sputum.   You develop dehydration.  You faint or repeatedly feel like you are going to pass out.  You develop repeated vomiting.  You develop a severe headache. MAKE SURE YOU:   Understand these instructions.  Will watch your condition.  Will get help right away if you are not doing well or get worse.   This information is not intended to replace advice given to you by your health care provider. Make sure you discuss any questions you have with your health care provider.   Document Released: 02/20/2004 Document Revised: 02/02/2014 Document Reviewed: 07/05/2012 Elsevier Interactive Patient Education 2016 [employer].  Allergic Rhinitis Allergic rhinitis is when the mucous membranes in the nose respond to allergens. Allergens are particles  in the air that cause your body to have an allergic reaction. This causes you to release allergic antibodies. Through a chain of events, these eventually cause you to release histamine into the blood stream. Although meant to protect the body, it is this release of histamine that causes your  discomfort, such as frequent sneezing, congestion, and an itchy, runny nose.  CAUSES Seasonal allergic rhinitis (hay fever) is caused by pollen allergens that may come from grasses, trees, and weeds. Year-round allergic rhinitis (perennial allergic rhinitis) is caused by allergens such as house dust mites, pet dander, and mold spores. SYMPTOMS  Nasal stuffiness (congestion).  Itchy, runny nose with sneezing and tearing of the eyes. DIAGNOSIS Your health care provider can help you determine the allergen or allergens that trigger your symptoms. If you and your health care provider are unable to determine the allergen, skin or blood testing may be used. Your health care provider will diagnose your condition after taking your health history and performing a physical exam. Your health care provider may assess you for other related conditions, such as asthma, pink eye, or an ear infection. TREATMENT Allergic rhinitis does not have a cure, but it can be controlled by:  Medicines that block allergy symptoms. These may include allergy shots, nasal sprays, and oral antihistamines.  Avoiding the allergen. Hay fever may often be treated with antihistamines in pill or nasal spray forms. Antihistamines block the effects of histamine. There are over-the-counter medicines that may help with nasal congestion and swelling around the eyes. Check with your health care provider before taking or giving this medicine. If avoiding the allergen or the medicine prescribed do not work, there are many new medicines your health care provider can prescribe. Stronger medicine may be used if initial measures are ineffective. Desensitizing injections can be used if medicine and avoidance does not work. Desensitization is when a patient is given ongoing shots until the body becomes less sensitive to the allergen. Make sure you follow up with your health care provider if problems continue. HOME CARE INSTRUCTIONS It is not possible  to completely avoid allergens, but you can reduce your symptoms by taking steps to limit your exposure to them. It helps to know exactly what you are allergic to so that you can avoid your specific triggers. SEEK MEDICAL CARE IF:  You have a fever.  You develop a cough that does not stop easily (persistent).  You have shortness of breath.  You start wheezing.  Symptoms interfere with normal daily activities.   This information is not intended to replace advice given to you by your health care provider. Make sure you discuss any questions you have with your health care provider.   Document Released: 10/07/2000 Document Revised: 02/02/2014 Document Reviewed: 09/19/2012 Elsevier Interactive Patient Education 2016 [employer]  Sinusitis, Adult Sinusitis is redness, soreness, and inflammation of the paranasal sinuses. Paranasal sinuses are air pockets within the bones of your face. They are located beneath your eyes, in the middle of your forehead, and above your eyes. In healthy paranasal sinuses, mucus is able to drain out, and air is able to circulate through them by way of your nose. However, when your paranasal sinuses are inflamed, mucus and air can become trapped. This can allow bacteria and other germs to grow and cause infection. Sinusitis can develop quickly and last only a short time (acute) or continue over a long period (chronic). Sinusitis that lasts for more than 12 weeks is considered chronic. CAUSES Causes of  sinusitis include:  Allergies.  Structural abnormalities, such as displacement of the cartilage that separates your nostrils (deviated septum), which can decrease the air flow through your nose and sinuses and affect sinus drainage.  Functional abnormalities, such as when the small hairs (cilia) that line your sinuses and help remove mucus do not work properly or are not present. SIGNS AND SYMPTOMS Symptoms of acute and chronic sinusitis are the same. The primary  symptoms are pain and pressure around the affected sinuses. Other symptoms include:  Upper toothache.  Earache.  Headache.  Bad breath.  Decreased sense of smell and taste.  A cough, which worsens when you are lying flat.  Fatigue.  Fever.  Thick drainage from your nose, which often is green and may contain pus (purulent).  Swelling and warmth over the affected sinuses. DIAGNOSIS Your health care provider will perform a physical exam. During your exam, your health care provider may perform any of the following to help determine if you have acute sinusitis or chronic sinusitis:  Look in your nose for signs of abnormal growths in your nostrils (nasal polyps).  Tap over the affected sinus to check for signs of infection.  View the inside of your sinuses using an imaging device that has a light attached (endoscope). If your health care provider suspects that you have chronic sinusitis, one or more of the following tests may be recommended:  Allergy tests.  Nasal culture. A sample of mucus is taken from your nose, sent to a lab, and screened for bacteria.  Nasal cytology. A sample of mucus is taken from your nose and examined by your health care provider to determine if your sinusitis is related to an allergy. TREATMENT Most cases of acute sinusitis are related to a viral infection and will resolve on their own within 10 days. Sometimes, medicines are prescribed to help relieve symptoms of both acute and chronic sinusitis. These may include pain medicines, decongestants, nasal steroid sprays, or saline sprays. However, for sinusitis related to a bacterial infection, your health care provider will prescribe antibiotic medicines. These are medicines that will help kill the bacteria causing the infection. Rarely, sinusitis is caused by a fungal infection. In these cases, your health care provider will prescribe antifungal medicine. For some cases of chronic sinusitis, surgery is  needed. Generally, these are cases in which sinusitis recurs more than 3 times per year, despite other treatments. HOME CARE INSTRUCTIONS  Drink plenty of water. Water helps thin the mucus so your sinuses can drain more easily.  Use a humidifier.  Inhale steam 3-4 times a day (for example, sit in the bathroom with the shower running).  Apply a warm, moist washcloth to your face 3-4 times a day, or as directed by your health care provider.  Use saline nasal sprays to help moisten and clean your sinuses.  Take medicines only as directed by your health care provider.  If you were prescribed either an antibiotic or antifungal medicine, finish it all even if you start to feel better. SEEK IMMEDIATE MEDICAL CARE IF:  You have increasing pain or severe headaches.  You have nausea, vomiting, or drowsiness.  You have swelling around your face.  You have vision problems.  You have a stiff neck.  You have difficulty breathing.   This information is not intended to replace advice given to you by your health care provider. Make sure you discuss any questions you have with your health care provider.   Document Released: 01/12/2005 Document Revised:  02/02/2014 Document Reviewed: 01/27/2011 Elsevier Interactive Patient Education 2016 Else[employer] Inc.  Upper Respiratory Infection, Adult Most upper respiratory infections (URIs) are a viral infection of the air passages leading to the lungs. A URI affects the nose, throat, and upper air passages. The most common type of URI is nasopharyngitis and is typically referred to as "the common cold." URIs run their course and usually go away on their own. Most of the time, a URI does not require medical attention, but sometimes a bacterial infection in the upper airways can follow a viral infection. This is called a secondary infection. Sinus and middle ear infections are common types of secondary upper respiratory infections. Bacterial pneumonia can also  complicate a URI. A URI can worsen asthma and chronic obstructive pulmonary disease (COPD). Sometimes, these complications can require emergency medical care and may be life threatening.  CAUSES Almost all URIs are caused by viruses. A virus is a type of germ and can spread from one person to another.  RISKS FACTORS You may be at risk for a URI if:   You smoke.   You have chronic heart or lung disease.  You have a weakened defense (immune) system.   You are very young or very old.   You have nasal allergies or asthma.  You work in crowded or poorly ventilated areas.  You work in health care facilities or schools. SIGNS AND SYMPTOMS  Symptoms typically develop 2-3 days after you come in contact with a cold virus. Most viral URIs last 7-10 days. However, viral URIs from the influenza virus (flu virus) can last 14-18 days and are typically more severe. Symptoms may include:   Runny or stuffy (congested) nose.   Sneezing.   Cough.   Sore throat.   Headache.   Fatigue.   Fever.   Loss of appetite.   Pain in your forehead, behind your eyes, and over your cheekbones (sinus pain).  Muscle aches.  DIAGNOSIS  Your health care provider may diagnose a URI by:  Physical exam.  Tests to check that your symptoms are not due to another condition such as:  Strep throat.  Sinusitis.  Pneumonia.  Asthma. TREATMENT  A URI goes away on its own with time. It cannot be cured with medicines, but medicines may be prescribed or recommended to relieve symptoms. Medicines may help:  Reduce your fever.  Reduce your cough.  Relieve nasal congestion. HOME CARE INSTRUCTIONS   Take medicines only as directed by your health care provider.   Gargle warm saltwater or take cough drops to comfort your throat as directed by your health care provider.  Use a warm mist humidifier or inhale steam from a shower to increase air moisture. This may make it easier to  breathe.  Drink enough fluid to keep your urine clear or pale yellow.   Eat soups and other clear broths and maintain good nutrition.   Rest as needed.   Return to work when your temperature has returned to normal or as your health care provider advises. You may need to stay home longer to avoid infecting others. You can also use a face mask and careful hand washing to prevent spread of the virus.  Increase the usage of your inhaler if you have asthma.   Do not use any tobacco products, including cigarettes, chewing tobacco, or electronic cigarettes. If you need help quitting, ask your health care provider. PREVENTION  The best way to protect yourself from getting a cold is to practice  good hygiene.   Avoid oral or hand contact with people with cold symptoms.   Wash your hands often if contact occurs.  There is no clear evidence that vitamin C, vitamin E, echinacea, or exercise reduces the chance of developing a cold. However, it is always recommended to get plenty of rest, exercise, and practice good nutrition.  SEEK MEDICAL CARE IF:   You are getting worse rather than better.   Your symptoms are not controlled by medicine.   You have chills.  You have worsening shortness of breath.  You have brown or red mucus.  You have yellow or brown nasal discharge.  You have pain in your face, especially when you bend forward.  You have a fever.  You have swollen neck glands.  You have pain while swallowing.  You have white areas in the back of your throat. SEEK IMMEDIATE MEDICAL CARE IF:   You have severe or persistent:  Headache.  Ear pain.  Sinus pain.  Chest pain.  You have chronic lung disease and any of the following:  Wheezing.  Prolonged cough.  Coughing up blood.  A change in your usual mucus.  You have a stiff neck.  You have changes in your:  Vision.  Hearing.  Thinking.  Mood. MAKE SURE YOU:   Understand these  instructions.  Will watch your condition.  Will get help right away if you are not doing well or get worse.   This information is not intended to replace advice given to you by your health care provider. Make sure you discuss any questions you have with your health care provider.   Document Released: 07/08/2000 Document Revised: 05/29/2014 Document Reviewed: 04/19/2013 Elsevier Interactive Patient Education 2016 [place].  Potassium Content of Foods Potassium is a mineral found in many foods and drinks. It helps keep fluids and minerals balanced in your body and affects how steadily your heart beats. Potassium also helps control your blood pressure and keep your muscles and nervous system healthy. Certain health conditions and medicines may change the balance of potassium in your body. When this happens, you can help balance your level of potassium through the foods that you do or do not eat. Your health care provider or dietitian may recommend an amount of potassium that you should have each day. The following lists of foods provide the amount of potassium (in parentheses) per serving in each item. HIGH IN POTASSIUM  The following foods and beverages have 200 mg or more of potassium per serving:  Apricots, 2 raw or 5 dry (200 mg).  Artichoke, 1 medium (345 mg).  Avocado, raw,  each (245 mg).  Banana, 1 medium (425 mg).  Beans, lima, or baked beans, canned,  cup (280 mg).  Beans, white, canned,  cup (595 mg).  Beef roast, 3 oz (320 mg).  Beef, ground, 3 oz (270 mg).  Beets, raw or cooked,  cup (260 mg).  Bran muffin, 2 oz (300 mg).  Broccoli,  cup (230 mg).  Brussels sprouts,  cup (250 mg).  Cantaloupe,  cup (215 mg).  Cereal, 100% bran,  cup (200-400 mg).  Cheeseburger, single, fast food, 1 each (225-400 mg).  Chicken, 3 oz (220 mg).  Clams, canned, 3 oz (535 mg).  Crab, 3 oz (225 mg).  Dates, 5 each (270 mg).  Dried beans and peas,  cup (300-475  mg).  Figs, dried, 2 each (260 mg).  Fish: halibut, tuna, cod, snapper, 3 oz (480 mg).  Fish: salmon,  haddock, swordfish, perch, 3 oz (300 mg).  Fish, tuna, canned 3 oz (200 mg).  [place] fries, fast food, 3 oz (470 mg).  Granola with fruit and nuts,  cup (200 mg).  Grapefruit juice,  cup (200 mg).  Greens, beet,  cup (655 mg).  Honeydew melon,  cup (200 mg).  Kale, raw, 1 cup (300 mg).  Kiwi, 1 medium (240 mg).  Kohlrabi, rutabaga, parsnips,  cup (280 mg).  Lentils,  cup (365 mg).  Mango, 1 each (325 mg).  Milk, chocolate, 1 cup (420 mg).  Milk: nonfat, low-fat, whole, buttermilk, 1 cup (350-380 mg).  Molasses, 1 Tbsp (295 mg).  Mushrooms,  cup (280) mg.  Nectarine, 1 each (275 mg).  Nuts: almonds, peanuts, hazelnuts, [place], cashew, mixed, 1 oz (200 mg).  Nuts, pistachios, 1 oz (295 mg).  Orange, 1 each (240 mg).  Orange juice,  cup (235 mg).  Papaya, medium,  fruit (390 mg).  Peanut butter, chunky, 2 Tbsp (240 mg).  Peanut butter, smooth, 2 Tbsp (210 mg).  Pear, 1 medium (200 mg).  Pomegranate, 1 whole (400 mg).  Pomegranate juice,  cup (215 mg).  Pork, 3 oz (350 mg).  Potato chips, salted, 1 oz (465 mg).  Potato, baked with skin, 1 medium (925 mg).  Potatoes, boiled,  cup (255 mg).  Potatoes, mashed,  cup (330 mg).  Prune juice,  cup (370 mg).  Prunes, 5 each (305 mg).  Pudding, chocolate,  cup (230 mg).  Pumpkin, canned,  cup (250 mg).  Raisins, seedless,  cup (270 mg).  Seeds, sunflower or pumpkin, 1 oz (240 mg).  Soy milk, 1 cup (300 mg).  Spinach,  cup (420 mg).  Spinach, canned,  cup (370 mg).  Sweet potato, baked with skin, 1 medium (450 mg).  Swiss chard,  cup (480 mg).  Tomato or vegetable juice,  cup (275 mg).  Tomato sauce or puree,  cup (400-550 mg).  Tomato, raw, 1 medium (290 mg).  Tomatoes, canned,  cup (200-300 mg).  [place], 3 oz (250 mg).  Wheat germ, 1 oz (250 mg).  Winter  squash,  cup (250 mg).  Yogurt, plain or fruited, 6 oz (260-435 mg).  Zucchini,  cup (220 mg). MODERATE IN POTASSIUM The following foods and beverages have 50-200 mg of potassium per serving:  Apple, 1 each (150 mg).  Apple juice,  cup (150 mg).  Applesauce,  cup (90 mg).  Apricot nectar,  cup (140 mg).  Asparagus, small spears,  cup or 6 spears (155 mg).  Bagel, cinnamon raisin, 1 each (130 mg).  Bagel, egg or plain, 4 in., 1 each (70 mg).  Beans, green,  cup (90 mg).  Beans, yellow,  cup (190 mg).  Beer, regular, 12 oz (100 mg).  Beets, canned,  cup (125 mg).  Blackberries,  cup (115 mg).  Blueberries,  cup (60 mg).  Bread, whole wheat, 1 slice (70 mg).  Broccoli, raw,  cup (145 mg).  Cabbage,  cup (150 mg).  Carrots, cooked or raw,  cup (180 mg).  Cauliflower, raw,  cup (150 mg).  Celery, raw,  cup (155 mg).  Cereal, bran flakes, cup (120-150 mg).  Cheese, cottage,  cup (110 mg).  Cherries, 10 each (150 mg).  Chocolate, 1 oz bar (165 mg).  Coffee, brewed 6 oz (90 mg).  Corn,  cup or 1 ear (195 mg).  Cucumbers,  cup (80 mg).  Egg, large, 1 each (60 mg).  Eggplant,  cup (60 mg).  Endive, raw, cup (80 mg).  English muffin, 1 each (65 mg).  Fish, orange roughy, 3 oz (150 mg).  Frankfurter, beef or pork, 1 each (75 mg).  Fruit cocktail,  cup (115 mg).  Grape juice,  cup (170 mg).  Grapefruit,  fruit (175 mg).  Grapes,  cup (155 mg).  Greens: kale, turnip, collard,  cup (110-150 mg).  Ice cream or frozen yogurt, chocolate,  cup (175 mg).  Ice cream or frozen yogurt, vanilla,  cup (120-150 mg).  Lemons, limes, 1 each (80 mg).  Lettuce, all types, 1 cup (100 mg).  Mixed vegetables,  cup (150 mg).  Mushrooms, raw,  cup (110 mg).  Nuts: walnuts, pecans, or macadamia, 1 oz (125 mg).  Oatmeal,  cup (80 mg).  Okra,  cup (110 mg).  Onions, raw,  cup (120 mg).  Peach, 1 each (185  mg).  Peaches, canned,  cup (120 mg).  Pears, canned,  cup (120 mg).  Peas, green, frozen,  cup (90 mg).  Peppers, green,  cup (130 mg).  Peppers, red,  cup (160 mg).  Pineapple juice,  cup (165 mg).  Pineapple, fresh or canned,  cup (100 mg).  Plums, 1 each (105 mg).  Pudding, vanilla,  cup (150 mg).  Raspberries,  cup (90 mg).  Rhubarb,  cup (115 mg).  Rice, wild,  cup (80 mg).  Shrimp, 3 oz (155 mg).  Spinach, raw, 1 cup (170 mg).  Strawberries,  cup (125 mg).  Summer squash  cup (175-200 mg).  Swiss chard, raw, 1 cup (135 mg).  Tangerines, 1 each (140 mg).  Tea, brewed, 6 oz (65 mg).  Turnips,  cup (140 mg).  Watermelon,  cup (85 mg).  Wine, red, table, 5 oz (180 mg).  Wine, white, table, 5 oz (100 mg). LOW IN POTASSIUM The following foods and beverages have less than 50 mg of potassium per serving.  Bread, white, 1 slice (30 mg).  Carbonated beverages, 12 oz (less than 5 mg).  Cheese, 1 oz (20-30 mg).  Cranberries,  cup (45 mg).  Cranberry juice cocktail,  cup (20 mg).  Fats and oils, 1 Tbsp (less than 5 mg).  Hummus, 1 Tbsp (32 mg).  Nectar: papaya, mango, or pear,  cup (35 mg).  Rice, white or brown,  cup (50 mg).  Spaghetti or macaroni,  cup cooked (30 mg).  Tortilla, flour or corn, 1 each (50 mg).  Waffle, 4 in., 1 each (50 mg). Hypokalemia Hypokalemia means that the amount of potassium in the blood is lower than normal.Potassium is a chemical, called an electrolyte, that helps regulate the amount of fluid in the body. It also stimulates muscle contraction and helps nerves function properly.Most of the body's potassium is inside of cells, and only a very small amount is in the blood. Because the amount in the blood is so small, minor changes can be life-threatening. CAUSES Antibiotics. Diarrhea or vomiting. Using laxatives too much, which can cause diarrhea. Chronic kidney disease. Water pills  (diuretics). Eating disorders (bulimia). Low magnesium level. Sweating a lot. SIGNS AND SYMPTOMS Weakness. Constipation. Fatigue. Muscle cramps. Mental confusion. Skipped heartbeats or irregular heartbeat (palpitations). Tingling or numbness. DIAGNOSIS  Your health care provider can diagnose hypokalemia with blood tests. In addition to checking your potassium level, your health care provider may also check other lab tests. TREATMENT Hypokalemia can be treated with potassium supplements taken by mouth or adjustments in your current medicines. If your potassium level is very low, you may  need to get potassium through a vein (IV) and be monitored in the hospital. A diet high in potassium is also helpful. Foods high in potassium are: Nuts, such as peanuts and pistachios. Seeds, such as sunflower seeds and pumpkin seeds. Peas, lentils, and lima beans. Whole grain and bran cereals and breads. Fresh fruit and vegetables, such as apricots, avocado, bananas, cantaloupe, kiwi, oranges, tomatoes, asparagus, and potatoes. Orange and tomato juices. Red meats. Fruit yogurt. HOME CARE INSTRUCTIONS Take all medicines as prescribed by your health care provider. Maintain a healthy diet by including nutritious food, such as fruits, vegetables, nuts, whole grains, and lean meats. If you are taking a laxative, be sure to follow the directions on the label. SEEK MEDICAL CARE IF: Your weakness gets worse. You feel your heart pounding or racing. You are vomiting or having diarrhea. You are diabetic and having trouble keeping your blood glucose in the normal range. SEEK IMMEDIATE MEDICAL CARE IF: You have chest pain, shortness of breath, or dizziness. You are vomiting or having diarrhea for more than 2 days. You faint. MAKE SURE YOU:  Understand these instructions. Will watch your condition. Will get help right away if you are not doing well or get worse.   This information is not intended to  replace advice given to you by your health care provider. Make sure you discuss any questions you have with your health care provider.   Document Released: 01/12/2005 Document Revised: 02/02/2014 Document Reviewed: 07/15/2012 Elsevier Interactive Patient Education 2016 Bowie chestnuts,  cup (40 mg).   This information is not intended to replace advice given to you by your health care provider. Make sure you discuss any questions you have with your health care provider.   Document Released: 08/26/2004 Document Revised: 01/17/2013 Document Reviewed: 12/09/2012 Elsevier Interactive Patient Education [employer].

## 2015-01-17 NOTE — ED Notes (Addendum)
Pt reports having nasal congestion/hoarse voice/SOB x2 days. Pt's voice is hoarse. RR even/unlabored. No history of asthma or pre-existing lung complications. No other c/c.

## 2015-10-01 ENCOUNTER — Encounter (HOSPITAL_COMMUNITY): Payer: Self-pay | Admitting: Emergency Medicine

## 2015-10-01 ENCOUNTER — Emergency Department (HOSPITAL_COMMUNITY)
Admission: EM | Admit: 2015-10-01 | Discharge: 2015-10-01 | Disposition: A | Discharge status: Home / Self Care | Payer: BC Managed Care – PPO | Attending: Emergency Medicine | Admitting: Emergency Medicine

## 2015-10-01 DIAGNOSIS — Z79899 Other long term (current) drug therapy: Secondary | ICD-10-CM | POA: Diagnosis not present

## 2015-10-01 DIAGNOSIS — T7840XA Allergy, unspecified, initial encounter: Secondary | ICD-10-CM | POA: Diagnosis not present

## 2015-10-01 MED ORDER — EPINEPHRINE 0.3 MG/0.3ML IJ SOAJ
0.3000 mg | Freq: Once | INTRAMUSCULAR | Status: AC
  Administered 2015-10-01: 0.3 mg via INTRAMUSCULAR
  Filled 2015-10-01: qty 0.3

## 2015-10-01 MED ORDER — PREDNISONE 20 MG PO TABS
60.0000 mg | ORAL_TABLET | Freq: Once | ORAL | Status: AC
  Administered 2015-10-01: 60 mg via ORAL
  Filled 2015-10-01: qty 3

## 2015-10-01 MED ORDER — PREDNISONE 20 MG PO TABS: 40.0000 mg | ORAL_TABLET | Freq: Once | ORAL | 0 refills | Status: AC

## 2015-10-01 NOTE — ED Notes (Signed)
Pt ambulatory and independent at discharge.  Verbalized understanding of discharge instructions 

## 2015-10-01 NOTE — Discharge Instructions (Signed)
Follow with her allergist. Return for worsening of symptoms.

## 2015-10-01 NOTE — ED Notes (Signed)
Pt ambulatory to restroom

## 2015-10-01 NOTE — ED Triage Notes (Signed)
Pt received allergy shots at 1630 and had a reaction about an hour later feeling itchy and a sore throat.  Pt reports her chest does feel tight and has gotten worse since the symptoms started.  No acute distress at this time.  Has an EPI pen but has not used it. Noted swelling to right eye. No oral swelling at this time.

## 2015-10-01 NOTE — ED Provider Notes (Signed)
[facility] DEPT Provider Note   CSN: [id number] Arrival date & time: 10/01/15  1923     History   Chief Complaint Chief Complaint  Patient presents with  . Allergic Reaction    HPI [name] is a 40 y.o. female.  HPI Patient presents with a likely allergic reaction. States she had her allergy shots at 4:30 today. After that she began to have a rash swelling and tightness in her chest and throat. She has had the allergy shots in the past and never had a reaction like this. No fevers or chills. No headache. No trouble swallowing but is having pain with swallowing. She has an EpiPen that she has not used. She took likely 100 mg Benadryl pills at home. No swelling of her tongue. No difficulty with her speech. She has had some nausea.   Past Medical History:  Diagnosis Date  . Anxiety   . Depression   . GERD (gastroesophageal reflux disease)   . History of hiatal hernia   . 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 Synovitis of wrist    RIGHT WRIST  . Tear of triangular fibrocartilage complex (TFCC)    RIGHT WRIST    Patient Active Problem List   Diagnosis Date Noted  . Wrist pain, chronic 04/12/2014  . Anxiety and depression 03/22/2014  . Strain of right inguinal muscle 07/06/2013    Past Surgical History:  Procedure Laterality Date  . BENIGN BREAST BX  1993  . CESAREAN SECTION  2001  & 2003   2003  W/  BILATERAL TUBAL LIGATION  . CYSTO/  RIGHT RETROGRADE PYELOGRAM/  RIGHT URETERAL STENT PLACMENT   03-01-2002  . CYSTO/  STENT REMOVAL/  RIGHT ACCUSIZE RETROGRADE URETEROPELVIC JUNCTION INCISION AND STENT REPLACEMENT  12-05-2001  . LAPAROSCOPIC ASSISTED VAGINAL HYSTERECTOMY  2014  . LEEP/  D & C HYSTEROSCOPY/  NOVASURE ENDOMETRIAL ABLATION  08-23-2006  . REPAIR LEFT TEAR DUCT INJURY  2013    . TOTAL NEPHRECTOMY Right 04-12-2002  . UMBILICAL HERNIA REPAIR  12-11-2008   LAPAROSCOPIC  . WRIST ARTHROSCOPY Right 09/19/2014   Procedure: RIGHT WRIST ARTHROSCOPY AND TFCC ((TRIANGULAR FIBROCARTILAGE COMPLEX) DEBRIDEMENT ;  Surgeon: Iran Planas, MD;  Location: [facility];  Service: Orthopedics;  Laterality: Right;    OB History    No data available       Home Medications    Prior to Admission medications   Medication Sig Start Date End Date Taking? Authorizing Provider  acetaminophen (TYLENOL) 500 MG tablet Take 1,000 mg by mouth every 6 (six) hours as needed.    Historical Provider, MD  azithromycin (ZITHROMAX) 250 MG tablet Take 1 tablet (250 mg total) by mouth daily. Take first 2 tablets together, then 1 every day until finished. 01/17/15   Delsa Grana, PA-C  cetirizine (ZYRTEC ALLERGY) 10 MG tablet Take 1 tablet (10 mg total) by mouth daily. 01/17/15   Delsa Grana, PA-C  diphenhydrAMINE (BENADRYL) 25 MG tablet Take 50 mg by mouth every 6 (six) hours as needed for allergies.    Historical Provider, MD  docusate sodium (COLACE) 100 MG capsule Take 1 capsule (100 mg total) by mouth 2 (two) times daily. 09/19/14   Iran Planas, MD  fluticasone (FLONASE) 50 MCG/ACT nasal spray Place 2 sprays into both nostrils daily. 01/17/15  Delsa Grana, PA-C  guaiFENesin-codeine 100-10 MG/5ML syrup Take 5 mLs by mouth 3 (three) times daily as needed for cough. 01/17/15   Delsa Grana, PA-C  Hydrocodone-Acetaminophen (VICODIN) 5-300 MG TABS Take 1 tablet by mouth 4 (four) times daily as needed (PAIN). 09/19/14   Iran Planas, MD  ondansetron (ZOFRAN ODT) 4 MG disintegrating tablet Take 1 tablet (4 mg total) by mouth every 8 (eight) hours as needed for nausea or vomiting. 01/17/15   Delsa Grana, PA-C  ondansetron (ZOFRAN) 4 MG tablet Take 1 tablet (4 mg total) by mouth every 8 (eight) hours as needed for nausea or vomiting. 09/19/14   Iran Planas, MD  Phenylephrine-DM-GG-APAP (TYLENOL  COLD/FLU SEVERE) 5-10-200-325 MG TABS Take 2 tablets by mouth 2 (two) times daily as needed (cold symptoms).    Historical Provider, MD  polyethylene glycol (MIRALAX / GLYCOLAX) packet Take 17 g by mouth daily as needed (constipation). Reported on 01/17/2015    Historical Provider, MD  predniSONE (DELTASONE) 20 MG tablet Take 2 tablets (40 mg total) by mouth daily. 01/17/15   Delsa Grana, PA-C  sertraline (ZOLOFT) 50 MG tablet Take 50 mg by mouth daily. 12/23/14   Historical Provider, MD    Family History Family History  Problem Relation Age of Onset  . Diabetes Father   . Asthma Brother   . Cancer Maternal Grandmother   . Mental illness Mother     anxiety, Bipolar disorder  . Asthma Son   . Diabetes Brother   . Hyperlipidemia Brother   . Asthma Son     Social History Social History  Substance Use Topics  . Smoking status: Never Smoker  . Smokeless tobacco: Never Used  . Alcohol use No     Allergies   Review of patient's allergies indicates no known allergies.   Review of Systems Review of Systems  Constitutional: Positive for appetite change.  HENT: Positive for facial swelling and sore throat. Negative for postnasal drip, sinus pressure and trouble swallowing.   Eyes: Negative for photophobia.  Respiratory: Positive for chest tightness.   Cardiovascular: Negative for chest pain.  Gastrointestinal: Positive for nausea. Negative for abdominal pain.  Endocrine: Negative for polyphagia.  Genitourinary: Negative for flank pain.  Musculoskeletal: Negative for joint swelling.  Skin: Positive for rash.  Neurological: Negative for speech difficulty.  Hematological: Negative for adenopathy.  Psychiatric/Behavioral: Negative for confusion.     Physical Exam Updated Vital Signs BP 135/90   Pulse 84   Temp 98.2 F (36.8 C) (Oral)   Resp 16   Ht 5\' 1"  (1.549 m)   Wt 130 lb (59 kg)   SpO2 100%   BMI 24.56 kg/m   Physical Exam  Constitutional: She appears  well-developed.  HENT:  Head: Normocephalic.  Slight swelling of left periorbital area. Posterior pharynx normal.  Eyes: EOM are normal.  Neck: Neck supple.  Cardiovascular: Normal rate.   Pulmonary/Chest: Effort normal. No respiratory distress. She has no wheezes.  Abdominal: There is no tenderness.  Musculoskeletal: Normal range of motion.  Neurological: She is alert.  Skin: Skin is warm. Capillary refill takes less than 2 seconds.     ED Treatments / Results  Labs (all labs ordered are listed, but only abnormal results are displayed) Labs Reviewed - No data to display  EKG  EKG Interpretation  Date/Time:  Tuesday October 01 2015 19:32:27 EDT Ventricular Rate:  83 PR Interval:    QRS Duration: 93 QT Interval:  368 QTC Calculation: 433 R Axis:  36 Text Interpretation:  Sinus rhythm Atrial premature complex Low voltage, precordial leads Confirmed by Alvino Chapel  MD, Hayleigh Bawa [id number]) on 10/01/2015 8:10:09 PM       Radiology No results found.  Procedures Procedures (including critical care time)  Medications Ordered in ED Medications  EPINEPHrine (EPI-PEN) injection 0.3 mg (0.3 mg Intramuscular Given 10/01/15 1955)  predniSONE (DELTASONE) tablet 60 mg (60 mg Oral Given 10/01/15 1955)     Initial Impression / Assessment and Plan / ED Course  I have reviewed the triage vital signs and the nursing notes.  Pertinent labs & imaging results that were available during my care of the patient were reviewed by me and considered in my medical decision making (see chart for details).  Clinical Course    Patient with likely allergic reaction to her allergy shots. Has been stable to improved in the ER over 3 hours of monitoring. Initial EpiPen and steroids given. Artie and Benadryl. Rest for status improved. Still has no wheezing. States her throat feels better. Will discharge home with another day of steroids. Follow-up with her allergist as needed. Will return as needed.  Final  Clinical Impressions(s) / ED Diagnoses   Final diagnoses:  None    New Prescriptions New Prescriptions   No medications on file     Davonna Belling, MD 10/01/15 2216

## 2015-10-01 NOTE — ED Notes (Signed)
Pt states her chest tightness, itching, and sore throat has been relieved.

## 2016-02-21 IMAGING — CR DG CHEST 2V
2 series · 2 of 2 positions shown · non-contrast
Comparison: 10/24/2009

CLINICAL DATA: Nasal congestion and shortness of breath for 2 days.

EXAM:
CHEST  2 VIEW

[w chest pa]
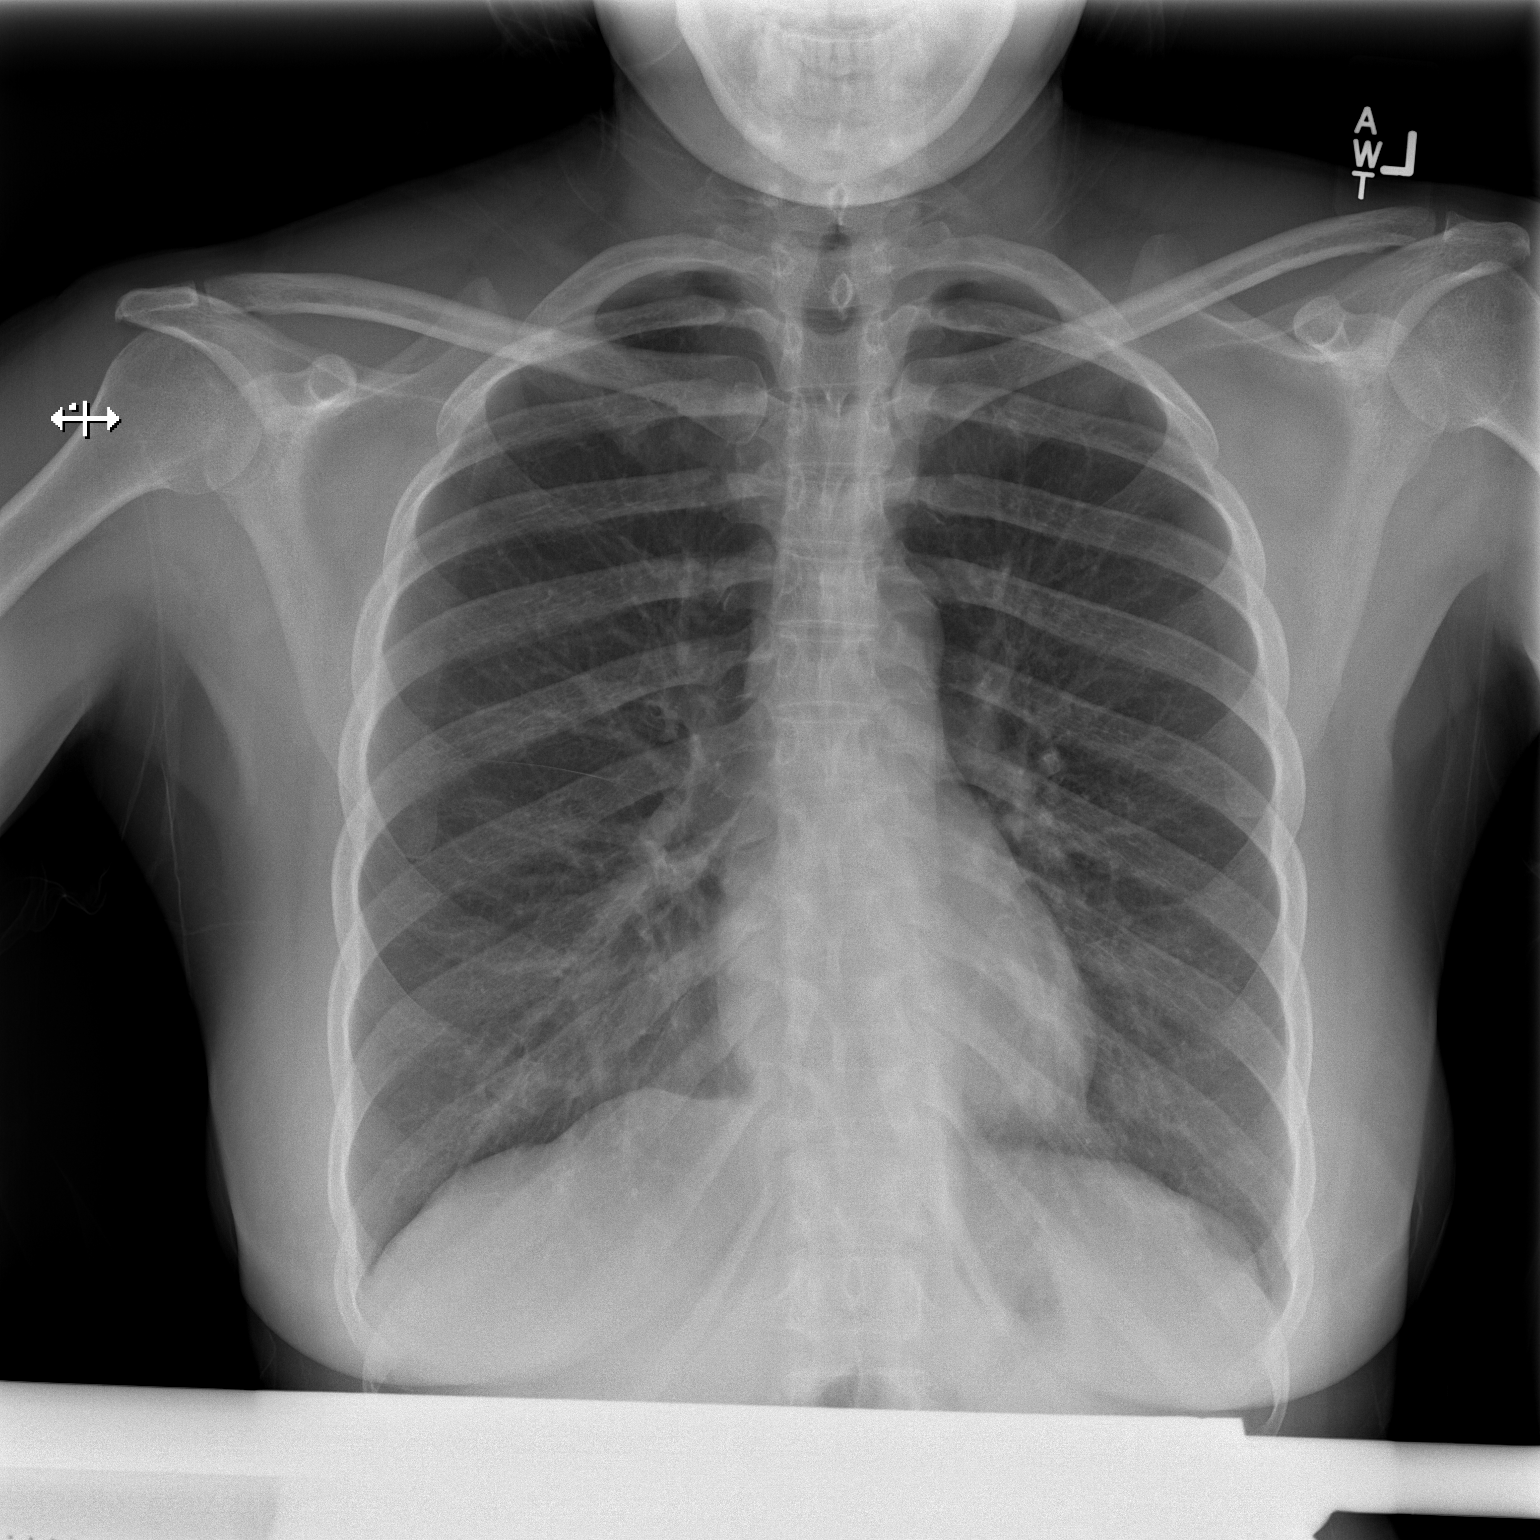

[w chest lat]
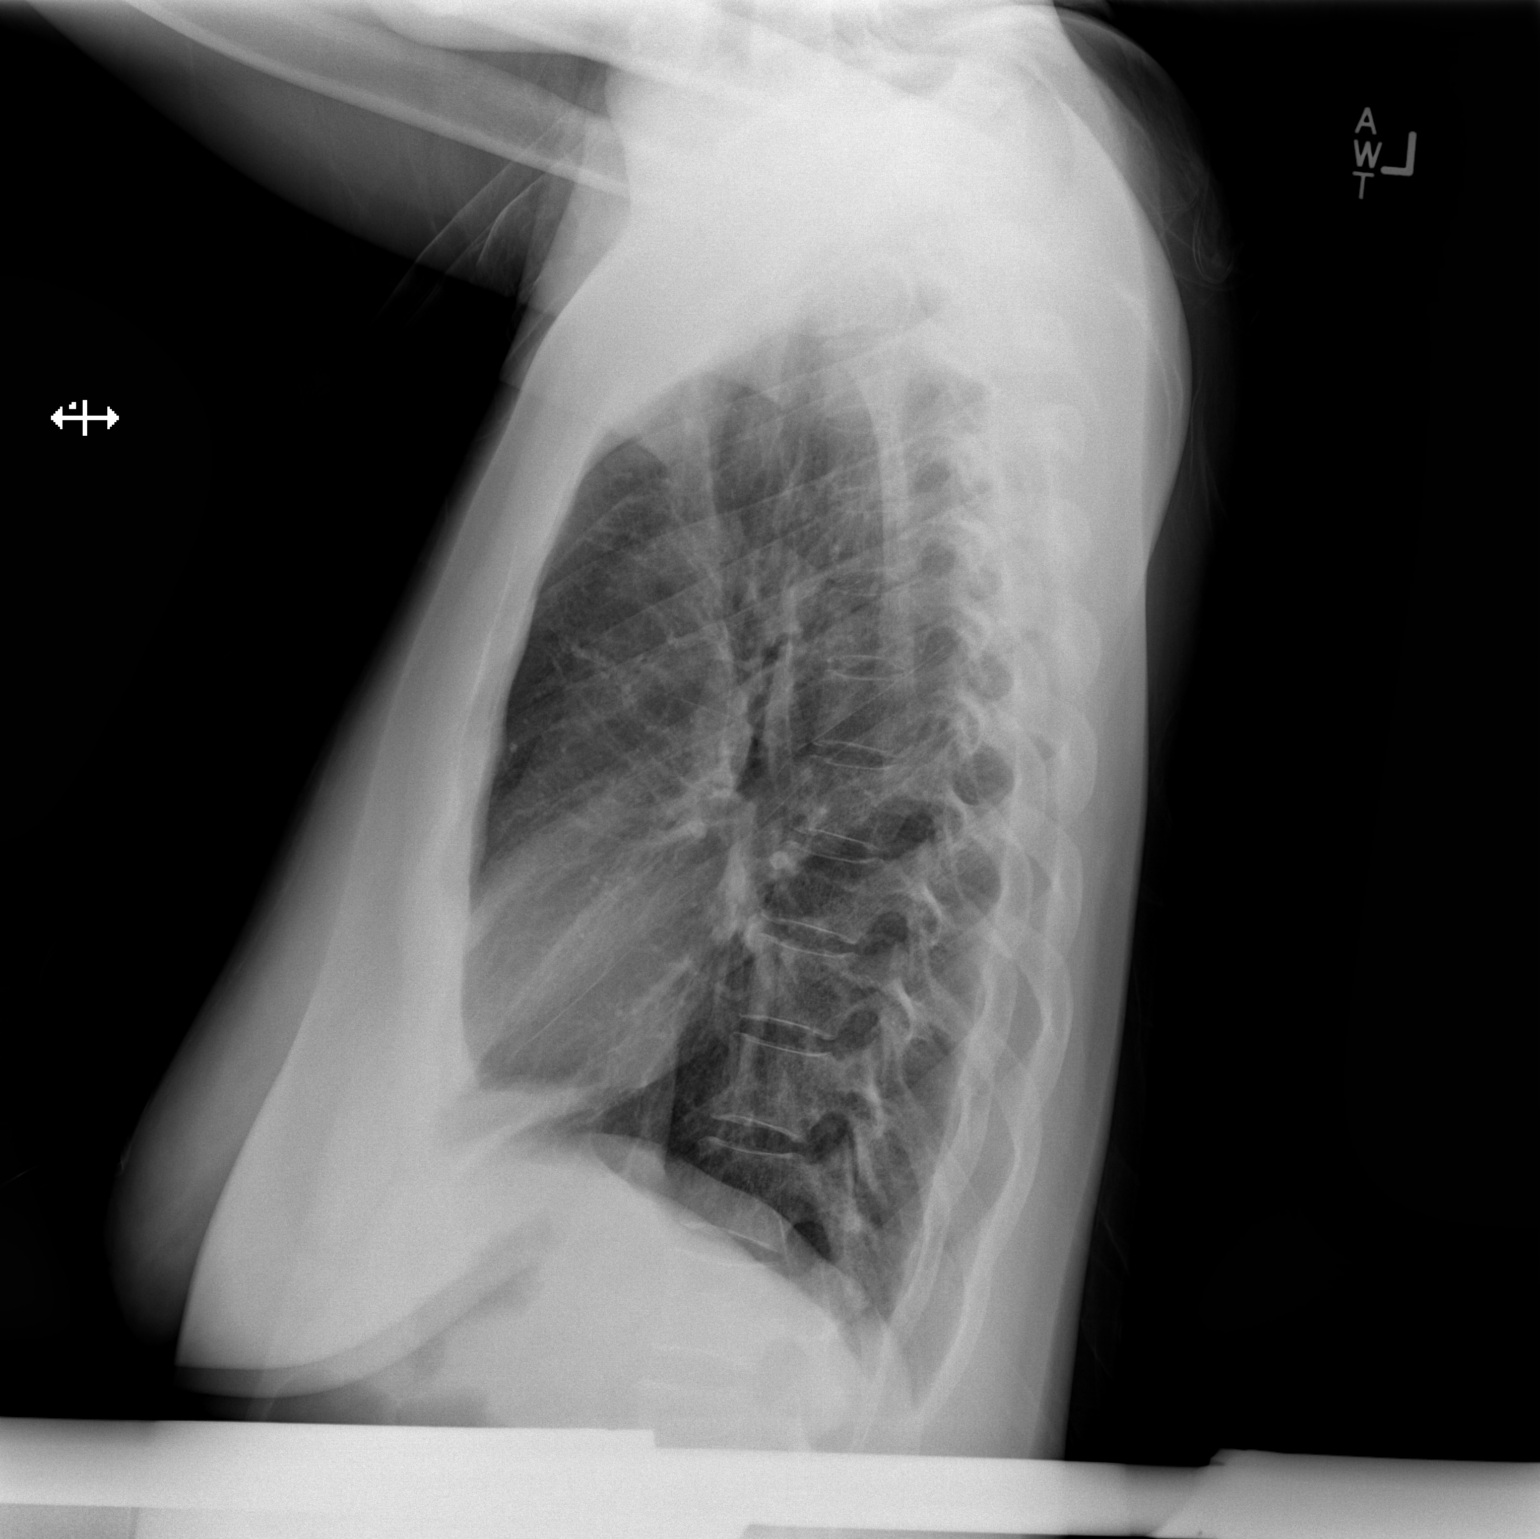

[2 of 2 positions shown; findings below may reference images not displayed]

FINDINGS: Few densities in the right lower chest may represent atelectasis.
There is no significant airspace disease or consolidation. Heart and
mediastinum are within normal limits. Trachea is midline. No large
pleural effusions. Bony thorax is intact.
IMPRESSION: No active cardiopulmonary disease.

## 2016-07-07 ENCOUNTER — Other Ambulatory Visit: Payer: Self-pay | Admitting: Obstetrics and Gynecology

## 2016-07-07 DIAGNOSIS — R928 Other abnormal and inconclusive findings on diagnostic imaging of breast: Secondary | ICD-10-CM

## 2016-07-17 ENCOUNTER — Ambulatory Visit
Admission: RE | Admit: 2016-07-17 | Discharge: 2016-07-17 | Disposition: A | Discharge status: Home / Self Care | Payer: BC Managed Care – PPO | Source: Ambulatory Visit | Attending: Obstetrics and Gynecology | Admitting: Obstetrics and Gynecology

## 2016-07-17 DIAGNOSIS — R928 Other abnormal and inconclusive findings on diagnostic imaging of breast: Secondary | ICD-10-CM

## 2017-08-30 NOTE — Progress Notes (Signed)
Chief Complaint  Patient presents with  . New Patient (Initial Visit)    establish care.  Gyn wanted pt to be checked for vertigo due to the symptoms she was telling him about.  Knot upper abdomen right above navel, not painful per pt, no hard and palpable.    HPI Vertigo Nausea and Vomiting She reports that she feels whoozy She feels like she just got off a merry go-round She states that if she turns in bed or gets up too fast she gets the symptoms She thought it was due to zoloft She has been on zoloft for 3 years Her episodes of whooziness started a year ago She has noted it more over the summer She has been drinking She does not take any antihistamine She has not taken any benadryl once or twice this summer She feels like she has been lightheaded for a few seconds of time Does not feel like she is going to pass out  Hernia She has a knot above her umbilicus Normal bowel movements She denies abd pain No bloody BM  Generalized Anxiety Disorder Pt reports that she is stable on her current meds She denies zoloft No panic attacks  Depression screen PHQ 2/9 08/31/2017  Decreased Interest 0  Down, Depressed, Hopeless 0  PHQ - 2 Score 0       Past Medical History:  Diagnosis Date  . Anxiety   . Depression   . GERD (gastroesophageal reflux disease)   . History of hiatal hernia   . 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 Synovitis of wrist    RIGHT WRIST  . Tear of triangular fibrocartilage complex (TFCC)    RIGHT WRIST    Current Outpatient Medications  Medication Sig Dispense Refill  . acetaminophen (TYLENOL) 500 MG tablet Take 1,000 mg by mouth every 6 (six) hours as needed.    . diphenhydrAMINE (BENADRYL) 25 MG tablet Take 50 mg by mouth every 6 (six) hours as needed for allergies.     . polyethylene glycol (MIRALAX / GLYCOLAX) packet Take 17 g by mouth daily as needed (constipation). Reported on 01/17/2015    . sertraline (ZOLOFT) 100 MG tablet Take 100 mg by mouth at bedtime.   11  . QVAR 80 MCG/ACT inhaler Take 2 puffs PO BID PRN SOB  3   No current facility-administered medications for this visit.     Allergies: No Known Allergies  Past Surgical History:  Procedure Laterality Date  . ABDOMINAL HYSTERECTOMY    . BENIGN BREAST BX  1993  . BREAST SURGERY    . CESAREAN SECTION  2001  & 2003   2003  W/  BILATERAL TUBAL LIGATION  . CYSTO/  RIGHT RETROGRADE PYELOGRAM/  RIGHT URETERAL STENT PLACMENT   03-01-2002  . CYSTO/  STENT REMOVAL/  RIGHT ACCUSIZE RETROGRADE URETEROPELVIC JUNCTION INCISION AND STENT REPLACEMENT  12-05-2001  . EYE SURGERY    . HERNIA REPAIR    . LAPAROSCOPIC ASSISTED VAGINAL HYSTERECTOMY  2014  . LEEP/  D & C HYSTEROSCOPY/  NOVASURE ENDOMETRIAL ABLATION  08-23-2006  . REPAIR LEFT TEAR DUCT INJURY  2013  . TOTAL NEPHRECTOMY Right 04-12-2002  . TUBAL LIGATION    . UMBILICAL HERNIA REPAIR  12-11-2008   LAPAROSCOPIC  . WRIST ARTHROSCOPY Right 09/19/2014   Procedure: RIGHT WRIST  ARTHROSCOPY AND TFCC ((TRIANGULAR FIBROCARTILAGE COMPLEX) DEBRIDEMENT ;  Surgeon: Iran Planas, MD;  Location: [facility];  Service: Orthopedics;  Laterality: Right;    Social History   Socioeconomic History  . Marital status: Married    Spouse name: [name]  . Number of children: 2  . Years of education: college  . Highest education level: Not on file  Occupational History  . Occupation: Oceanographer    Comment: [employer]  Social Needs  . Financial resource strain: Not on file  . Food insecurity:    Worry: Not on file    Inability: Not on file  . Transportation needs:    Medical: Not on file    Non-medical: Not on file  Tobacco Use  . Smoking status: Never Smoker  . Smokeless tobacco: Never Used  Substance and Sexual  Activity  . Alcohol use: No    Alcohol/week: 0.0 oz  . Drug use: No  . Sexual activity: Yes    Partners: Male    Birth control/protection: Surgical  Lifestyle  . Physical activity:    Days per week: Not on file    Minutes per session: Not on file  . Stress: Not on file  Relationships  . Social connections:    Talks on phone: Not on file    Gets together: Not on file    Attends religious service: Not on file    Active member of club or organization: Not on file    Attends meetings of clubs or organizations: Not on file    Relationship status: Not on file  Other Topics Concern  . Not on file  Social History Narrative   Degree in Social Work from [employer]. Working on [employer] in Clinical biochemist.   Lives with her husband and their two sons.    Family History  Problem Relation Age of Onset  . Diabetes Father   . Asthma Brother   . Cancer Maternal Grandmother   . Mental illness Mother        anxiety, Bipolar disorder  . Asthma Son   . Diabetes Brother   . Hyperlipidemia Brother   . Asthma Son      ROS Review of Systems See HPI Constitution: No fevers or chills No malaise No diaphoresis Skin: No rash or itching Eyes: no blurry vision, no double vision GU: no dysuria or hematuria Neuro: see hpi all others reviewed and negative   Objective: Vitals:   08/31/17 0844  BP: 108/70  Pulse: 88  Resp: 17  Temp: 98.3 F (36.8 C)  TempSrc: Oral  SpO2: 98%  Weight: 151 lb 6.4 oz (68.7 kg)  Height: 5\' 1"  (1.549 m)  Body mass index is 28.61 kg/m.   Physical Exam  Constitutional: She is oriented to person, place, and time. She appears well-developed and well-nourished.  HENT:  Head: Normocephalic and atraumatic.  Eyes: Conjunctivae and EOM are normal.  Neck: Normal range of motion. Neck supple.  Cardiovascular: Normal rate, regular rhythm and normal heart sounds.  No murmur heard. Pulmonary/Chest: Effort normal and breath sounds normal. No stridor. No  respiratory distress. She has no wheezes.  Musculoskeletal: Normal range of motion.  Neurological: She is alert and oriented to person, place, and time.  Skin: Skin is warm. Capillary refill takes less than 2 seconds.  Psychiatric: She has a normal mood and affect. Her behavior is normal. Judgment and thought content normal.   Abdominal exam Increased abdominal girth, nondistended, normactive bs, nontender No  rebound or guarding Previous scar from prior surgery No palpable mass   Assessment and Plan Elia was seen today for new patient (initial visit).  Diagnoses and all orders for this visit:  Lightheadedness- multifactorial Could be med side effect, poor nutrition or deficiency -     CBC -     Comprehensive metabolic panel -     VITAMIN D 25 Hydroxy (Vit-D Deficiency, Fractures) -     TSH -     Ambulatory referral to Physical Therapy  Nausea without vomiting-  Will screen for underlying deficiencies -     CBC -     Comprehensive metabolic panel -     VITAMIN D 25 Hydroxy (Vit-D Deficiency, Fractures) -     TSH  Vitamin D deficiency- previously low and on supplementation Will reassess today -     VITAMIN D 25 Hydroxy (Vit-D Deficiency, Fractures)  Generalized anxiety disorder- stable on zoloft  Discussed that she should continue zoloft for now but would advised that she work with exercise and counseling to impact her mood  Incisional hernia, without obstruction or gangrene- discussed that weight gain can lead to incisional hernia especially in the setting of increased abdominal girth     Kaven Cumbie A Blanton Kardell

## 2017-08-31 ENCOUNTER — Encounter: Payer: Self-pay | Admitting: Family Medicine

## 2017-08-31 ENCOUNTER — Other Ambulatory Visit: Payer: Self-pay

## 2017-08-31 ENCOUNTER — Ambulatory Visit: Payer: BC Managed Care – PPO | Admitting: Family Medicine

## 2017-08-31 VITALS — BP 108/70 | HR 88 | Temp 98.3°F | Resp 17 | Ht 61.0 in | Wt 151.4 lb

## 2017-08-31 DIAGNOSIS — R110 Nausea: Secondary | ICD-10-CM

## 2017-08-31 DIAGNOSIS — K432 Incisional hernia without obstruction or gangrene: Secondary | ICD-10-CM

## 2017-08-31 DIAGNOSIS — E559 Vitamin D deficiency, unspecified: Secondary | ICD-10-CM | POA: Diagnosis not present

## 2017-08-31 DIAGNOSIS — F411 Generalized anxiety disorder: Secondary | ICD-10-CM

## 2017-08-31 DIAGNOSIS — R42 Dizziness and giddiness: Secondary | ICD-10-CM | POA: Diagnosis not present

## 2017-08-31 NOTE — Patient Instructions (Addendum)
[facility] TED talk    IF you received an x-ray today, you will receive an invoice from [facility]. Please contact [facility] at [phone] with questions or concerns regarding your invoice.   IF you received labwork today, you will receive an invoice from [facility]. Please contact [facility] at [phone] with questions or concerns regarding your invoice.   Our billing staff will not be able to assist you with questions regarding bills from these companies.  You will be contacted with the lab results as soon as they are available. The fastest way to get your results is to activate your My Chart account. Instructions are located on the last page of this paperwork. If you have not heard from [place] regarding the results in 2 weeks, please contact this office.     Benign Positional Vertigo Vertigo is the feeling that you or your surroundings are moving when they are not. Benign positional vertigo is the most common form of vertigo. The cause of this condition is not serious (is benign). This condition is triggered by certain movements and positions (is positional). This condition can be dangerous if it occurs while you are doing something that could endanger you or others, such as driving. What are the causes? In many cases, the cause of this condition is not known. It may be caused by a disturbance in an area of the inner ear that helps your brain to sense movement and balance. This disturbance can be caused by a viral infection (labyrinthitis), head injury, or repetitive motion. What increases the risk? This condition is more likely to develop in:  Women.  People who are 66 years of age or older.  What are the signs or symptoms? Symptoms of this condition usually happen when you move your head or your eyes in different directions. Symptoms may start suddenly, and they usually last for less than a minute. Symptoms may include:  Loss of balance and falling.  Feeling like  you are spinning or moving.  Feeling like your surroundings are spinning or moving.  Nausea and vomiting.  Blurred vision.  Dizziness.  Involuntary eye movement (nystagmus).  Symptoms can be mild and cause only slight annoyance, or they can be severe and interfere with daily life. Episodes of benign positional vertigo may return (recur) over time, and they may be triggered by certain movements. Symptoms may improve over time. How is this diagnosed? This condition is usually diagnosed by medical history and a physical exam of the head, neck, and ears. You may be referred to a health care provider who specializes in ear, nose, and throat (ENT) problems (otolaryngologist) or a provider who specializes in disorders of the nervous system (neurologist). You may have additional testing, including:  MRI.  A CT scan.  Eye movement tests. Your health care provider may ask you to change positions quickly while he or she watches you for symptoms of benign positional vertigo, such as nystagmus. Eye movement may be tested with an electronystagmogram (ENG), caloric stimulation, the Dix-Hallpike test, or the roll test.  An electroencephalogram (EEG). This records electrical activity in your brain.  Hearing tests.  How is this treated? Usually, your health care provider will treat this by moving your head in specific positions to adjust your inner ear back to normal. Surgery may be needed in severe cases, but this is rare. In some cases, benign positional vertigo may resolve on its own in 2-4 weeks. Follow these instructions at home: Safety  Move slowly.Avoid sudden body or  head movements.  Avoid driving.  Avoid operating heavy machinery.  Avoid doing any tasks that would be dangerous to you or others if a vertigo episode would occur.  If you have trouble walking or keeping your balance, try using a cane for stability. If you feel dizzy or unstable, sit down right away.  Return to your normal  activities as told by your health care provider. Ask your health care provider what activities are safe for you. General instructions  Take over-the-counter and prescription medicines only as told by your health care provider.  Avoid certain positions or movements as told by your health care provider.  Drink enough fluid to keep your urine clear or pale yellow.  Keep all follow-up visits as told by your health care provider. This is important. Contact a health care provider if:  You have a fever.  Your condition gets worse or you develop new symptoms.  Your family or friends notice any behavioral changes.  Your nausea or vomiting gets worse.  You have numbness or a "pins and needles" sensation. Get help right away if:  You have difficulty speaking or moving.  You are always dizzy.  You faint.  You develop severe headaches.  You have weakness in your legs or arms.  You have changes in your hearing or vision.  You develop a stiff neck.  You develop sensitivity to light. This information is not intended to replace advice given to you by your health care provider. Make sure you discuss any questions you have with your health care provider. Document Released: 10/20/2005 Document Revised: 06/20/2015 Document Reviewed: 05/07/2014 Elsevier Interactive Patient Education  [employer].

## 2017-09-01 LAB — COMPREHENSIVE METABOLIC PANEL
A/G RATIO: 2 (ref 1.2–2.2)
ALBUMIN: 4.5 g/dL (ref 3.5–5.5)
ALK PHOS: 60 IU/L (ref 39–117)
ALT: 10 IU/L (ref 0–32)
AST: 14 IU/L (ref 0–40)
BILIRUBIN TOTAL: 0.2 mg/dL (ref 0.0–1.2)
BUN / CREAT RATIO: 8 — AB (ref 9–23)
BUN: 8 mg/dL (ref 6–24)
CHLORIDE: 101 mmol/L (ref 96–106)
CO2: 26 mmol/L (ref 20–29)
CREATININE: 0.95 mg/dL (ref 0.57–1.00)
Calcium: 9.5 mg/dL (ref 8.7–10.2)
GFR calc Af Amer: 84 mL/min/{1.73_m2} (ref >59)
GFR calc non Af Amer: 73 mL/min/{1.73_m2} (ref >59)
GLOBULIN, TOTAL: 2.3 g/dL (ref 1.5–4.5)
Glucose: 91 mg/dL (ref 65–99)
Potassium: 4.3 mmol/L (ref 3.5–5.2)
SODIUM: 141 mmol/L (ref 134–144)
Total Protein: 6.8 g/dL (ref 6.0–8.5)

## 2017-09-01 LAB — CBC
HEMATOCRIT: 40.3 % (ref 34.0–46.6)
HEMOGLOBIN: 13.7 g/dL (ref 11.1–15.9)
MCH: 31.2 pg (ref 26.6–33.0)
MCHC: 34 g/dL (ref 31.5–35.7)
MCV: 92 fL (ref 79–97)
Platelets: 247 10*3/uL (ref 150–450)
RBC: 4.39 x10E6/uL (ref 3.77–5.28)
RDW: 13.6 % (ref 12.3–15.4)
WBC: 6.6 10*3/uL (ref 3.4–10.8)

## 2017-09-01 LAB — TSH: TSH: 2.36 u[IU]/mL (ref 0.450–4.500)

## 2017-09-01 LAB — VITAMIN D 25 HYDROXY (VIT D DEFICIENCY, FRACTURES): VIT D 25 HYDROXY: 23.9 ng/mL — AB (ref 30.0–100.0)

## 2017-12-01 ENCOUNTER — Ambulatory Visit: Payer: BC Managed Care – PPO | Admitting: Family Medicine

## 2017-12-01 ENCOUNTER — Encounter: Payer: Self-pay | Admitting: Family Medicine

## 2017-12-01 ENCOUNTER — Other Ambulatory Visit: Payer: Self-pay

## 2017-12-01 VITALS — BP 110/72 | HR 67 | Temp 97.8°F | Ht 61.0 in | Wt 153.8 lb

## 2017-12-01 DIAGNOSIS — J069 Acute upper respiratory infection, unspecified: Secondary | ICD-10-CM | POA: Diagnosis not present

## 2017-12-01 DIAGNOSIS — R05 Cough: Secondary | ICD-10-CM

## 2017-12-01 DIAGNOSIS — R059 Cough, unspecified: Secondary | ICD-10-CM

## 2017-12-01 NOTE — Progress Notes (Signed)
Subjective:  By signing my name below, I, [name], attest that this documentation has been prepared under the direction and in the presence of Deanna Ray, MD. Electronically Signed: Moises Lin, [place]. 12/01/2017 , 12:33 PM .  Patient was seen in Room 9 .   Patient ID: [name], female    DOB: 1975-08-30, 42 y.o.   MRN: [id number] Chief Complaint  Patient presents with  . Pneumonia    f/u from Urgent care   HPI [name] is a 42 y.o. female Here for follow up of pneumonia. Patient states she initially started feeling sick about 1 week ago with a little chest heaviness with wheezing. Her symptoms progressively gotten worse so she went to [facility] urgent care in [place] as she felt worse with cough, congestion, and runny nose. She was informed that she had acute bronchitis and pneumonia. She was prescribed prednisone, antibiotics, and tessalon pearls (which she's been taking tid). She did not have xray done at the urgent care. She denies given breathing treatment or inhaler, or using a nasal spray. Today is the last dose of antibiotics. She denies wheezing in the past few days. She noticed a few kids at school being sick. Yesterday, she was coughing up some discolored phlegm. She denies any fever in past few days. She wants to make sure she was clear before returning to work. She's been able to sleep at night.   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   Patient Active Problem List   Diagnosis Date Noted  . Wrist pain, chronic 04/12/2014  . Anxiety and depression 03/22/2014  . Strain of right inguinal muscle 07/06/2013   Past Medical History:  Diagnosis Date  . Anxiety   . Depression   . GERD (gastroesophageal reflux disease)   . History of hiatal hernia   . 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 S/p nephrectomy    right due to chronic  congenital upj obstruction  . Synovitis of wrist    RIGHT WRIST  . Tear of triangular fibrocartilage complex (TFCC)    RIGHT WRIST   Past Surgical History:  Procedure Laterality Date  . ABDOMINAL HYSTERECTOMY    . BENIGN BREAST BX  1993  . BREAST SURGERY    . CESAREAN SECTION  2001  & 2003   2003  W/  BILATERAL TUBAL LIGATION  . CYSTO/  RIGHT RETROGRADE PYELOGRAM/  RIGHT URETERAL STENT PLACMENT   03-01-2002  . CYSTO/  STENT REMOVAL/  RIGHT ACCUSIZE RETROGRADE URETEROPELVIC JUNCTION INCISION AND STENT REPLACEMENT  12-05-2001  . EYE SURGERY    . HERNIA REPAIR    . LAPAROSCOPIC ASSISTED VAGINAL HYSTERECTOMY  2014  . LEEP/  D & C HYSTEROSCOPY/  NOVASURE ENDOMETRIAL ABLATION  08-23-2006  . REPAIR LEFT TEAR DUCT INJURY  2013  . TOTAL NEPHRECTOMY Right 04-12-2002  . TUBAL LIGATION    . UMBILICAL HERNIA REPAIR  12-11-2008   LAPAROSCOPIC  . WRIST ARTHROSCOPY Right 09/19/2014   Procedure: RIGHT WRIST ARTHROSCOPY AND TFCC ((TRIANGULAR FIBROCARTILAGE COMPLEX) DEBRIDEMENT ;  Surgeon: Iran Planas, MD;  Location: [facility];  Service: Orthopedics;  Laterality: Right;   No Known Allergies Prior to Admission medications   Medication Sig Start Date End Date Taking? Authorizing Provider  acetaminophen (TYLENOL) 500 MG tablet Take 1,000 mg by mouth every 6 (six) hours as needed.    [provider]  diphenhydrAMINE (BENADRYL) 25 MG tablet Take 50 mg by mouth every 6 (six) hours as needed for allergies.    [provider]  polyethylene glycol (MIRALAX / GLYCOLAX) packet Take 17 g by mouth daily as needed (constipation). Reported on 01/17/2015    [provider]  QVAR 80 MCG/ACT inhaler Take 2 puffs PO BID PRN SOB 07/12/15   [provider]  sertraline (ZOLOFT) 100 MG tablet Take 100 mg by mouth at bedtime.  08/23/15   [provider]   Social History   Socioeconomic History  . Marital status: Married    Spouse name: [name]  . Number of  children: 2  . Years of education: college  . Highest education level: Not on file  Occupational History  . Occupation: Oceanographer    Comment: [employer]  Social Needs  . Financial resource strain: Not on file  . Food insecurity:    Worry: Not on file    Inability: Not on file  . Transportation needs:    Medical: Not on file    Non-medical: Not on file  Tobacco Use  . Smoking status: Never Smoker  . Smokeless tobacco: Never Used  Substance and Sexual Activity  . Alcohol use: No    Alcohol/week: 0.0 standard drinks  . Drug use: No  . Sexual activity: Yes    Partners: Male    Birth control/protection: Surgical  Lifestyle  . Physical activity:    Days per week: Not on file    Minutes per session: Not on file  . Stress: Not on file  Relationships  . Social connections:    Talks on phone: Not on file    Gets together: Not on file    Attends religious service: Not on file    Active member of club or organization: Not on file    Attends meetings of clubs or organizations: Not on file    Relationship status: Not on file  . Intimate partner violence:    Fear of current or ex partner: Not on file    Emotionally abused: Not on file    Physically abused: Not on file    Forced sexual activity: Not on file  Other Topics Concern  . Not on file  Social History Narrative   Degree in Social Work from [employer]. Working on [employer] in Clinical biochemist.   Lives with her husband and their two sons.   Review of Systems  Constitutional: Negative for chills, fatigue, fever and unexpected weight change.  Respiratory: Positive for cough. Negative for wheezing.   Gastrointestinal: Negative for constipation, diarrhea, nausea and vomiting.  Skin: Negative for rash and wound.  Neurological: Negative for dizziness, weakness and headaches.       Objective:   Physical Exam  Constitutional: She is oriented to person, place, and time. She appears well-developed  and well-nourished. No distress.  HENT:  Head: Normocephalic and atraumatic.  Nose: Right sinus exhibits no maxillary sinus tenderness and no frontal sinus tenderness. Left sinus exhibits no maxillary sinus tenderness and no frontal sinus tenderness.  Eyes: Pupils are equal, round, and reactive to light. EOM are normal.  Neck: Neck supple.  Cardiovascular: Normal rate.  Pulmonary/Chest: Effort normal and breath sounds normal. No respiratory distress. She has no wheezes. She has no rales.  Musculoskeletal: Normal range of motion.  Lymphadenopathy:    She has no cervical adenopathy.  Neurological: She is alert and oriented to person, place, and time.  Skin: Skin is warm  and dry.  Psychiatric: She has a normal mood and affect. Her behavior is normal.  Nursing note and vitals reviewed.   Vitals:   12/01/17 1152  BP: 110/72  Pulse: 67  Temp: 97.8 F (36.6 C)  TempSrc: Oral  SpO2: 99%  Weight: 153 lb 12.8 oz (69.8 kg)  Height: 5\' 1"  (1.549 m)       Assessment & Plan:    Bennie Scaff is a 42 y.o. female Cough  Acute upper respiratory infection  Clinically improving on current regimen from outside office.  Discussed Mucinex or Mucinex DM for cough, saline nasal spray for nasal congestion and RTC precautions given.  No orders of the defined types were placed in this encounter.  Patient Instructions   Your lungs sound okay today.  Okay to continue same medications that were prescribed previously, but I expect that you will continue to improve.  Try Mucinex or Mucinex DM for the cough, saline or salt water nasal spray for nasal congestion as needed.Return to the clinic or go to the nearest emergency room if any of your symptoms worsen or new symptoms occur.   Cough, Adult Coughing is a reflex that clears your throat and your airways. Coughing helps to heal and protect your lungs. It is normal to cough occasionally, but a cough that happens with other symptoms or lasts a long  time may be a sign of a condition that needs treatment. A cough may last only 2-3 weeks (acute), or it may last longer than 8 weeks (chronic). What are the causes? Coughing is commonly caused by:  Breathing in substances that irritate your lungs.  A viral or bacterial respiratory infection.  Allergies.  Asthma.  Postnasal drip.  Smoking.  Acid backing up from the stomach into the esophagus (gastroesophageal reflux).  Certain medicines.  Chronic lung problems, including COPD (or rarely, lung cancer).  Other medical conditions such as heart failure.  Follow these instructions at home: Pay attention to any changes in your symptoms. Take these actions to help with your discomfort:  Take medicines only as told by your health care provider. ? If you were prescribed an antibiotic medicine, take it as told by your health care provider. Do not stop taking the antibiotic even if you start to feel better. ? Talk with your health care provider before you take a cough suppressant medicine.  Drink enough fluid to keep your urine clear or pale yellow.  If the air is dry, use a cold steam vaporizer or humidifier in your bedroom or your home to help loosen secretions.  Avoid anything that causes you to cough at work or at home.  If your cough is worse at night, try sleeping in a semi-upright position.  Avoid cigarette smoke. If you smoke, quit smoking. If you need help quitting, ask your health care provider.  Avoid caffeine.  Avoid alcohol.  Rest as needed.  Contact a health care provider if:  You have new symptoms.  You cough up pus.  Your cough does not get better after 2-3 weeks, or your cough gets worse.  You cannot control your cough with suppressant medicines and you are losing sleep.  You develop pain that is getting worse or pain that is not controlled with pain medicines.  You have a fever.  You have unexplained weight loss.  You have night sweats. Get help  right away if:  You cough up Lin.  You have difficulty breathing.  Your heartbeat is very fast. This information  is not intended to replace advice given to you by your health care provider. Make sure you discuss any questions you have with your health care provider. Document Released: 07/11/2010 Document Revised: 06/20/2015 Document Reviewed: 03/21/2014 Elsevier Interactive Patient Education  2018 [place].  Upper Respiratory Infection, Adult Most upper respiratory infections (URIs) are caused by a virus. A URI affects the nose, throat, and upper air passages. The most common type of URI is often called "the common cold." Follow these instructions at home:  Take medicines only as told by your doctor.  Gargle warm saltwater or take cough drops to comfort your throat as told by your doctor.  Use a warm mist humidifier or inhale steam from a shower to increase air moisture. This may make it easier to breathe.  Drink enough fluid to keep your pee (urine) clear or pale yellow.  Eat soups and other clear broths.  Have a healthy diet.  Rest as needed.  Go back to work when your fever is gone or your doctor says it is okay. ? You may need to stay home longer to avoid giving your URI to others. ? You can also wear a face mask and wash your hands often to prevent spread of the virus.  Use your inhaler more if you have asthma.  Do not use any tobacco products, including cigarettes, chewing tobacco, or electronic cigarettes. If you need help quitting, ask your doctor. Contact a doctor if:  You are getting worse, not better.  Your symptoms are not helped by medicine.  You have chills.  You are getting more short of breath.  You have brown or red mucus.  You have yellow or brown discharge from your nose.  You have pain in your face, especially when you bend forward.  You have a fever.  You have puffy (swollen) neck glands.  You have pain while swallowing.  You have  white areas in the back of your throat. Get help right away if:  You have very bad or constant: ? Headache. ? Ear pain. ? Pain in your forehead, behind your eyes, and over your cheekbones (sinus pain). ? Chest pain.  You have long-lasting (chronic) lung disease and any of the following: ? Wheezing. ? Long-lasting cough. ? Coughing up Lin. ? A change in your usual mucus.  You have a stiff neck.  You have changes in your: ? Vision. ? Hearing. ? Thinking. ? Mood. This information is not intended to replace advice given to you by your health care provider. Make sure you discuss any questions you have with your health care provider. Document Released: 07/01/2007 Document Revised: 09/15/2015 Document Reviewed: 04/19/2013 Elsevier Interactive Patient Education  [employer].    If you have lab work done today you will be contacted with your lab results within the next 2 weeks.  If you have not heard from [place] then please contact us. The fastest way to get your results is to register for My Chart.   IF you received an x-Lin today, you will receive an invoice from [facility]. Please contact [facility] at [phone] with questions or concerns regarding your invoice.   IF you received labwork today, you will receive an invoice from [facility]. Please contact [facility] at [phone] with questions or concerns regarding your invoice.   Our billing staff will not be able to assist you with questions regarding bills from these companies.  You will be contacted with the lab results as soon as they are  available. The fastest way to get your results is to activate your My Chart account. Instructions are located on the last page of this paperwork. If you have not heard from [place] regarding the results in 2 weeks, please contact this office.       I personally performed the services described in this documentation, which was scribed in my presence. The recorded  information has been reviewed and considered for accuracy and completeness, addended by me as needed, and agree with information above.  Signed,   Deanna Ray, MD [facility].  12/04/17 12:31 PM

## 2017-12-01 NOTE — Patient Instructions (Addendum)
Your lungs sound okay today.  Okay to continue same medications that were prescribed previously, but I expect that you will continue to improve.  Try Mucinex or Mucinex DM for the cough, saline or salt water nasal spray for nasal congestion as needed.Return to the clinic or go to the nearest emergency room if any of your symptoms worsen or new symptoms occur.   Cough, Adult Coughing is a reflex that clears your throat and your airways. Coughing helps to heal and protect your lungs. It is normal to cough occasionally, but a cough that happens with other symptoms or lasts a long time may be a sign of a condition that needs treatment. A cough may last only 2-3 weeks (acute), or it may last longer than 8 weeks (chronic). What are the causes? Coughing is commonly caused by:  Breathing in substances that irritate your lungs.  A viral or bacterial respiratory infection.  Allergies.  Asthma.  Postnasal drip.  Smoking.  Acid backing up from the stomach into the esophagus (gastroesophageal reflux).  Certain medicines.  Chronic lung problems, including COPD (or rarely, lung cancer).  Other medical conditions such as heart failure.  Follow these instructions at home: Pay attention to any changes in your symptoms. Take these actions to help with your discomfort:  Take medicines only as told by your health care provider. ? If you were prescribed an antibiotic medicine, take it as told by your health care provider. Do not stop taking the antibiotic even if you start to feel better. ? Talk with your health care provider before you take a cough suppressant medicine.  Drink enough fluid to keep your urine clear or pale yellow.  If the air is dry, use a cold steam vaporizer or humidifier in your bedroom or your home to help loosen secretions.  Avoid anything that causes you to cough at work or at home.  If your cough is worse at night, try sleeping in a semi-upright position.  Avoid cigarette  smoke. If you smoke, quit smoking. If you need help quitting, ask your health care provider.  Avoid caffeine.  Avoid alcohol.  Rest as needed.  Contact a health care provider if:  You have new symptoms.  You cough up pus.  Your cough does not get better after 2-3 weeks, or your cough gets worse.  You cannot control your cough with suppressant medicines and you are losing sleep.  You develop pain that is getting worse or pain that is not controlled with pain medicines.  You have a fever.  You have unexplained weight loss.  You have night sweats. Get help right away if:  You cough up blood.  You have difficulty breathing.  Your heartbeat is very fast. This information is not intended to replace advice given to you by your health care provider. Make sure you discuss any questions you have with your health care provider. Document Released: 07/11/2010 Document Revised: 06/20/2015 Document Reviewed: 03/21/2014 Elsevier Interactive Patient Education  2018 [place].  Upper Respiratory Infection, Adult Most upper respiratory infections (URIs) are caused by a virus. A URI affects the nose, throat, and upper air passages. The most common type of URI is often called "the common cold." Follow these instructions at home:  Take medicines only as told by your doctor.  Gargle warm saltwater or take cough drops to comfort your throat as told by your doctor.  Use a warm mist humidifier or inhale steam from a shower to increase air moisture. This may  make it easier to breathe.  Drink enough fluid to keep your pee (urine) clear or pale yellow.  Eat soups and other clear broths.  Have a healthy diet.  Rest as needed.  Go back to work when your fever is gone or your doctor says it is okay. ? You may need to stay home longer to avoid giving your URI to others. ? You can also wear a face mask and wash your hands often to prevent spread of the virus.  Use your inhaler more if you  have asthma.  Do not use any tobacco products, including cigarettes, chewing tobacco, or electronic cigarettes. If you need help quitting, ask your doctor. Contact a doctor if:  You are getting worse, not better.  Your symptoms are not helped by medicine.  You have chills.  You are getting more short of breath.  You have brown or red mucus.  You have yellow or brown discharge from your nose.  You have pain in your face, especially when you bend forward.  You have a fever.  You have puffy (swollen) neck glands.  You have pain while swallowing.  You have white areas in the back of your throat. Get help right away if:  You have very bad or constant: ? Headache. ? Ear pain. ? Pain in your forehead, behind your eyes, and over your cheekbones (sinus pain). ? Chest pain.  You have long-lasting (chronic) lung disease and any of the following: ? Wheezing. ? Long-lasting cough. ? Coughing up blood. ? A change in your usual mucus.  You have a stiff neck.  You have changes in your: ? Vision. ? Hearing. ? Thinking. ? Mood. This information is not intended to replace advice given to you by your health care provider. Make sure you discuss any questions you have with your health care provider. Document Released: 07/01/2007 Document Revised: 09/15/2015 Document Reviewed: 04/19/2013 Elsevier Interactive Patient Education  [employer].    If you have lab work done today you will be contacted with your lab results within the next 2 weeks.  If you have not heard from [place] then please contact us. The fastest way to get your results is to register for My Chart.   IF you received an x-ray today, you will receive an invoice from [facility]. Please contact [facility] at [phone] with questions or concerns regarding your invoice.   IF you received labwork today, you will receive an invoice from [facility]. Please contact [facility] at [phone] with  questions or concerns regarding your invoice.   Our billing staff will not be able to assist you with questions regarding bills from these companies.  You will be contacted with the lab results as soon as they are available. The fastest way to get your results is to activate your My Chart account. Instructions are located on the last page of this paperwork. If you have not heard from [place] regarding the results in 2 weeks, please contact this office.

## 2017-12-15 ENCOUNTER — Ambulatory Visit: Payer: BC Managed Care – PPO | Admitting: Emergency Medicine

## 2018-01-04 ENCOUNTER — Ambulatory Visit: Payer: BC Managed Care – PPO | Admitting: Family Medicine

## 2018-01-04 ENCOUNTER — Other Ambulatory Visit: Payer: Self-pay

## 2018-01-04 ENCOUNTER — Encounter: Payer: Self-pay | Admitting: Family Medicine

## 2018-01-04 VITALS — BP 119/68 | HR 81 | Temp 98.2°F | Ht 61.0 in | Wt 152.2 lb

## 2018-01-04 DIAGNOSIS — M25512 Pain in left shoulder: Secondary | ICD-10-CM

## 2018-01-04 MED ORDER — IBUPROFEN 800 MG PO TABS: 800.0000 mg | ORAL_TABLET | Freq: Three times a day (TID) | ORAL | 0 refills | Status: DC | PRN

## 2018-01-04 MED ORDER — CYCLOBENZAPRINE HCL 10 MG PO TABS: 10.0000 mg | ORAL_TABLET | Freq: Three times a day (TID) | ORAL | 0 refills | Status: DC | PRN

## 2018-01-04 NOTE — Progress Notes (Signed)
12/10/20191:56 PM  [name] 05-25-1975, 42 y.o. female [id number]  Chief Complaint  Patient presents with  . Shoulder Pain    left shoulder pain, thinks she hurt her arm getting undressed    HPI:   Patient is a 42 y.o. female with past medical history significant for anxiety who presents today for left shoulder pain  She was taking off her sports bra and then she felt an instant pain of left shoulder about 5 days ago, front and top of shoulder Pain radiates down to her elbow No numbness or tingling  No swelling Doing activities with hand above head aggravate pain Has been taking ibu 400mg  and tylenol, tried heating pad, did not really help  Fall Risk  01/04/2018 12/01/2017 08/31/2017  Falls in the past year? 0 0 No     Depression screen [facility] 2/9 01/04/2018 12/01/2017 08/31/2017  Decreased Interest 0 0 0  Down, Depressed, Hopeless 0 0 0  PHQ - 2 Score 0 0 0    No Known Allergies  Prior to Admission medications   Medication Sig Start Date End Date Taking? Authorizing Provider  acetaminophen (TYLENOL) 500 MG tablet Take 1,000 mg by mouth every 6 (six) hours as needed.    [provider]  diphenhydrAMINE (BENADRYL) 25 MG tablet Take 50 mg by mouth every 6 (six) hours as needed for allergies.    [provider]  polyethylene glycol (MIRALAX / GLYCOLAX) packet Take 17 g by mouth daily as needed (constipation). Reported on 01/17/2015    [provider]  sertraline (ZOLOFT) 50 MG tablet Take 125 mg by mouth daily.  11/26/17   [provider]    Past Medical History:  Diagnosis Date  . Anxiety   . Depression   . GERD (gastroesophageal reflux disease)   . History of hiatal hernia   . 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 Synovitis of wrist    RIGHT WRIST  .  Tear of triangular fibrocartilage complex (TFCC)    RIGHT WRIST    Past Surgical History:  Procedure Laterality Date  . ABDOMINAL HYSTERECTOMY    . BENIGN BREAST BX  1993  . BREAST SURGERY    . CESAREAN SECTION  2001  & 2003   2003  W/  BILATERAL TUBAL LIGATION  . CYSTO/  RIGHT RETROGRADE PYELOGRAM/  RIGHT URETERAL STENT PLACMENT   03-01-2002  . CYSTO/  STENT REMOVAL/  RIGHT ACCUSIZE RETROGRADE URETEROPELVIC JUNCTION INCISION AND STENT REPLACEMENT  12-05-2001  . EYE SURGERY    . HERNIA REPAIR    . LAPAROSCOPIC ASSISTED VAGINAL HYSTERECTOMY  2014  . LEEP/  D & C HYSTEROSCOPY/  NOVASURE ENDOMETRIAL ABLATION  08-23-2006  . REPAIR LEFT TEAR DUCT INJURY  2013  . TOTAL NEPHRECTOMY Right 04-12-2002  . TUBAL LIGATION    . UMBILICAL HERNIA REPAIR  12-11-2008   LAPAROSCOPIC  . WRIST ARTHROSCOPY Right 09/19/2014   Procedure: RIGHT WRIST ARTHROSCOPY AND TFCC ((TRIANGULAR FIBROCARTILAGE COMPLEX) DEBRIDEMENT ;  Surgeon: Iran Planas, MD;  Location: [facility];  Service: Orthopedics;  Laterality: Right;    Social History   Tobacco Use  . Smoking status: Never Smoker  . Smokeless tobacco: Never Used  Substance Use Topics  . Alcohol use: No    Alcohol/week: 0.0 standard drinks    Family  History  Problem Relation Age of Onset  . Diabetes Father   . Asthma Brother   . Cancer Maternal Grandmother   . Mental illness Mother        anxiety, Bipolar disorder  . Asthma Son   . Diabetes Brother   . Hyperlipidemia Brother   . Asthma Son     ROS Per hpi  OBJECTIVE:  Blood pressure 119/68, pulse 81, temperature 98.2 F (36.8 C), temperature source Oral, height 5\' 1"  (1.549 m), weight 152 lb 3.2 oz (69 kg), SpO2 97 %. Body mass index is 28.76 kg/m.   Physical Exam  Gen: AAOx3, NAD LEFT shoulder: FROM, pain with flexion and abduction. Swelling, muscle spasm of trapezius. Distally NVI.   ASSESSMENT and PLAN  1. Left anterior shoulder pain Muscle strain/spasm.  Discussed supportive measures, new meds r/se/b.  Other orders - ibuprofen (ADVIL,MOTRIN) 800 MG tablet; Take 1 tablet (800 mg total) by mouth every 8 (eight) hours as needed. - cyclobenzaprine (FLEXERIL) 10 MG tablet; Take 1 tablet (10 mg total) by mouth 3 (three) times daily as needed for muscle spasms.  Return in about 2 weeks (around 01/18/2018), or if symptoms worsen or fail to improve.    Rutherford Guys, MD [facility] [place], [place] [address] Ph.  [phone] Fax [phone]

## 2018-01-04 NOTE — Patient Instructions (Signed)
° ° ° °  If you have lab work done today you will be contacted with your lab results within the next 2 weeks.  If you have not heard from us then please contact us. The fastest way to get your results is to register for My Chart. ° ° °IF you received an x-ray today, you will receive an invoice from [facility]. Please contact [facility] at [phone] with questions or concerns regarding your invoice.  ° °IF you received labwork today, you will receive an invoice from [facility]. Please contact [facility] at [phone] with questions or concerns regarding your invoice.  ° °Our billing staff will not be able to assist you with questions regarding bills from these companies. ° °You will be contacted with the lab results as soon as they are available. The fastest way to get your results is to activate your My Chart account. Instructions are located on the last page of this paperwork. If you have not heard from us regarding the results in 2 weeks, please contact this office. °  ° ° ° °

## 2018-01-17 ENCOUNTER — Ambulatory Visit: Payer: BC Managed Care – PPO | Admitting: Family Medicine

## 2018-01-17 ENCOUNTER — Encounter: Payer: Self-pay | Admitting: Family Medicine

## 2018-01-17 ENCOUNTER — Other Ambulatory Visit: Payer: Self-pay

## 2018-01-17 VITALS — BP 115/73 | HR 101 | Temp 98.4°F | Resp 14 | Ht 61.0 in | Wt 158.0 lb

## 2018-01-17 DIAGNOSIS — M25512 Pain in left shoulder: Secondary | ICD-10-CM | POA: Diagnosis not present

## 2018-01-17 MED ORDER — IBUPROFEN 800 MG PO TABS: 800.0000 mg | ORAL_TABLET | Freq: Three times a day (TID) | ORAL | 0 refills | Status: DC | PRN

## 2018-01-17 MED ORDER — TRAMADOL HCL 50 MG PO TABS: 50.0000 mg | ORAL_TABLET | Freq: Three times a day (TID) | ORAL | 0 refills | Status: DC | PRN

## 2018-01-17 NOTE — Progress Notes (Signed)
12/23/20195:08 PM  [name] 11-Jun-1975, 42 y.o. female [id number]  Chief Complaint  Patient presents with  . Shoulder Pain    follow up for left shoulder pain, Not much better    HPI:   Patient is a 42 y.o. female  who presents today for followup on her left shoulder pain  Seen 2 weeks ago Pain started after taking off her bra Treated as muscle strain with ibu and flexeril - provides partial relief Having increased pain when she tries to reach across her body or when she tries to lift her arm above head She denies any numbness and tingling   Fall Risk  01/17/2018 01/04/2018 12/01/2017 08/31/2017  Falls in the past year? 0 0 0 No     Depression screen [facility] 2/9 01/17/2018 01/04/2018 12/01/2017  Decreased Interest 0 0 0  Down, Depressed, Hopeless 0 0 0  PHQ - 2 Score 0 0 0    No Known Allergies  Prior to Admission medications   Medication Sig Start Date End Date Taking? Authorizing Provider  acetaminophen (TYLENOL) 500 MG tablet Take 1,000 mg by mouth every 6 (six) hours as needed.    [provider]  cyclobenzaprine (FLEXERIL) 10 MG tablet Take 1 tablet (10 mg total) by mouth 3 (three) times daily as needed for muscle spasms. 01/04/18   Rutherford Guys, MD  diphenhydrAMINE (BENADRYL) 25 MG tablet Take 50 mg by mouth every 6 (six) hours as needed for allergies.    [provider]  ibuprofen (ADVIL,MOTRIN) 800 MG tablet Take 1 tablet (800 mg total) by mouth every 8 (eight) hours as needed. 01/04/18   Rutherford Guys, MD  polyethylene glycol [facility] Floria Raveling) packet Take 17 g by mouth daily as needed (constipation). Reported on 01/17/2015    [provider]  sertraline (ZOLOFT) 50 MG tablet Take 125 mg by mouth daily.  11/26/17   [provider]    Past Medical History:  Diagnosis Date  . Anxiety   . Depression   . GERD (gastroesophageal reflux disease)   . History of hiatal hernia   . 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 Synovitis of wrist    RIGHT WRIST  . Tear of triangular fibrocartilage complex (TFCC)    RIGHT WRIST    Past Surgical History:  Procedure Laterality Date  . ABDOMINAL HYSTERECTOMY    . BENIGN BREAST BX  1993  . BREAST SURGERY    . CESAREAN SECTION  2001  & 2003   2003  W/  BILATERAL TUBAL LIGATION  . CYSTO/  RIGHT RETROGRADE PYELOGRAM/  RIGHT URETERAL STENT PLACMENT   03-01-2002  . CYSTO/  STENT REMOVAL/  RIGHT ACCUSIZE RETROGRADE URETEROPELVIC JUNCTION INCISION AND STENT REPLACEMENT  12-05-2001  . EYE SURGERY    . HERNIA REPAIR    . LAPAROSCOPIC ASSISTED VAGINAL HYSTERECTOMY  2014  . LEEP/  D & C HYSTEROSCOPY/  NOVASURE ENDOMETRIAL ABLATION  08-23-2006  . REPAIR LEFT TEAR DUCT INJURY  2013  . TOTAL NEPHRECTOMY Right 04-12-2002  . TUBAL LIGATION    . UMBILICAL HERNIA REPAIR  12-11-2008   LAPAROSCOPIC  . WRIST ARTHROSCOPY Right 09/19/2014   Procedure: RIGHT WRIST ARTHROSCOPY AND TFCC ((TRIANGULAR FIBROCARTILAGE COMPLEX) DEBRIDEMENT ;  Surgeon: Iran Planas, MD;  Location: [facility];  Service:  Orthopedics;  Laterality: Right;    Social History   Tobacco Use  . Smoking status: Never Smoker  . Smokeless tobacco: Never Used  Substance Use Topics  . Alcohol use: No    Alcohol/week: 0.0 standard drinks    Family History  Problem Relation Age of Onset  . Diabetes Father   . Asthma Brother   . Cancer Maternal Grandmother   . Mental illness Mother        anxiety, Bipolar disorder  . Asthma Son   . Diabetes Brother   . Hyperlipidemia Brother   . Asthma Son     ROS Per hpi  OBJECTIVE:  Blood pressure 115/73, pulse (!) 101, temperature 98.4 F (36.9 C), temperature source Oral, resp. rate 14, height 5\' 1"  (1.549 m), weight 158 lb (71.7 kg), SpO2 97 %. Body mass index is 29.85  kg/m.   Physical Exam Gen: AAOx3, NAD Left upper chest wall TTP, no spasms or swelling noted Left Shoulder: FROM, pain at end point of flexion and at 90 degrees abduction, neg neers. + hawkins and scarf test Distally NVI   ASSESSMENT and PLAN  1. Left anterior shoulder pain Referring to sports medicine. Cont with supportive measures. Stop flexeril and start tramadol prn. New med r/se/b reviewed. - Ambulatory referral to Sports Medicine  Other orders - traMADol (ULTRAM) 50 MG tablet; Take 1 tablet (50 mg total) by mouth every 8 (eight) hours as needed. - ibuprofen (ADVIL,MOTRIN) 800 MG tablet; Take 1 tablet (800 mg total) by mouth every 8 (eight) hours as needed.  Return if symptoms worsen or fail to improve.    Rutherford Guys, MD Primary Care at [facility] [place], [place] [address] Ph.  [phone] Fax [phone]

## 2018-01-17 NOTE — Patient Instructions (Signed)
° ° ° °  If you have lab work done today you will be contacted with your lab results within the next 2 weeks.  If you have not heard from us then please contact us. The fastest way to get your results is to register for My Chart. ° ° °IF you received an x-ray today, you will receive an invoice from [facility]. Please contact [facility] at [phone] with questions or concerns regarding your invoice.  ° °IF you received labwork today, you will receive an invoice from [facility]. Please contact [facility] at [phone] with questions or concerns regarding your invoice.  ° °Our billing staff will not be able to assist you with questions regarding bills from these companies. ° °You will be contacted with the lab results as soon as they are available. The fastest way to get your results is to activate your My Chart account. Instructions are located on the last page of this paperwork. If you have not heard from us regarding the results in 2 weeks, please contact this office. °  ° ° ° °

## 2018-02-01 ENCOUNTER — Encounter: Payer: Self-pay | Admitting: Sports Medicine

## 2018-02-01 ENCOUNTER — Ambulatory Visit: Payer: BC Managed Care – PPO | Admitting: Sports Medicine

## 2018-02-01 VITALS — BP 126/80 | Ht 61.0 in | Wt 158.0 lb

## 2018-02-01 DIAGNOSIS — M25512 Pain in left shoulder: Secondary | ICD-10-CM | POA: Diagnosis not present

## 2018-02-01 DIAGNOSIS — S46012A Strain of muscle(s) and tendon(s) of the rotator cuff of left shoulder, initial encounter: Secondary | ICD-10-CM

## 2018-02-01 MED ORDER — METHYLPREDNISOLONE ACETATE 40 MG/ML IJ SUSP
40.0000 mg | Freq: Once | INTRAMUSCULAR | Status: AC
  Administered 2018-02-01: 40 mg via INTRA_ARTICULAR

## 2018-02-01 NOTE — Patient Instructions (Addendum)
[facility] Outpatient Rehabilitation at [facility] [place], [place], [place] [address] Phone: [phone]

## 2018-02-02 ENCOUNTER — Encounter: Payer: Self-pay | Admitting: Sports Medicine

## 2018-02-02 NOTE — Progress Notes (Signed)
   Subjective:    Patient ID: [name], female    DOB: [birth year], 44 y.o.   MRN: [id number]  HPI chief complaint: Left shoulder pain  Very pleasant right-hand-dominant 43 year old female comes in today complaining of 4 weeks of posterior and lateral left shoulder pain.  Symptoms began acutely as she was removing her sports bra.  She felt a pull in the left shoulder followed by pain which has persisted.  Pain is present with any sort of shoulder motion, especially with overhead reaching or external rotation.  Only comfortable position is lying down.  She was seen by her primary care physician and given prescriptions for Flexeril, tramadol, and ibuprofen.  None of these have been helpful.  She was also given pendulum exercises.  Pain will radiate to the elbow but not into the forearm or hand.  No numbness or tingling.  No previous problems with the left shoulder.  No neck pain.  No prior neck surgeries.  Past medical history reviewed Medications reviewed Allergies reviewed    Review of Systems    As above Objective:   Physical Exam  Well-developed, well-nourished.  No acute distress.  Awake alert and oriented x3.  Vital signs reviewed.  Left shoulder: Patient has full active and passive range of motion with a painful arc.  There is no tenderness to palpation over the acromioclavicular joint nor over the bicipital groove.  Positive empty can, positive Hawkins.  Rotator cuff strength is 5/5 bilaterally but does reproduce left shoulder pain with resisted supraspinatus and external rotation.  Negative O'Brien's.  Negative clunk.  Neurovascularly intact distally.      Assessment & Plan:   Left shoulder pain likely secondary to rotator cuff strain  Patient's left subacromial space is injected with cortisone today.  This is done atraumatically under sterile technique after risks and benefits were explained.  She tolerates this without difficulty.  I would like for her to start formal  physical therapy in [place] and follow-up with me in 3 weeks.  If symptoms persist, consider imaging at that time.  Call with questions or concerns in the interim.  Consent obtained and verified. Time-out conducted. Noted no overlying erythema, induration, or other signs of local infection. Skin prepped in a sterile fashion. Topical analgesic spray: Ethyl chloride. Joint: left shoulder (subacromial) Needle: 25g 1.5 inch Completed without difficulty. Meds: 3cc 1% xylocaine, 1cc (40mg ) depomedrol  Advised to call if fevers/chills, erythema, induration, drainage, or persistent bleeding.

## 2018-02-22 ENCOUNTER — Ambulatory Visit: Payer: BC Managed Care – PPO | Admitting: Sports Medicine

## 2018-02-22 VITALS — BP 112/76 | Ht 61.0 in | Wt 160.0 lb

## 2018-02-22 DIAGNOSIS — M25512 Pain in left shoulder: Secondary | ICD-10-CM | POA: Diagnosis not present

## 2018-02-22 DIAGNOSIS — S46012D Strain of muscle(s) and tendon(s) of the rotator cuff of left shoulder, subsequent encounter: Secondary | ICD-10-CM | POA: Diagnosis not present

## 2018-02-23 NOTE — Progress Notes (Signed)
   Subjective:    Patient ID: [name], female    DOB: Nov 24, 1975, 43 y.o.   MRN: [id number]  HPI   Patient comes in today for follow-up on left shoulder pain.  She is about 75% better after a subacromial cortisone injection.  She was unable to start physical therapy due to its cost.  She still endorses pain with certain motions such as reaching behind her back but it is intermittent.  She is no longer taking medication for her shoulder pain.    Review of Systems As above    Objective:   Physical Exam  Well-developed, well-nourished.  No acute distress.  Awake alert and oriented x3.  Vital signs reviewed  Left shoulder: Patient has full active and passive range of motion.  Slightly positive painful arc.  Positive empty can, positive Hawkins.  Rotator cuff strength is 5/5 bilaterally but does reproduce shoulder pain on the left.  Negative O'Brien's.  Neurovascularly intact distally.      Assessment & Plan:   Improving left shoulder pain secondary to rotator cuff impingement versus rotator cuff strain  Patient is 75% better after recent subacromial cortisone injection.  Since formal physical therapy is cost prohibitive, we will give her a home exercise program consisting of Jobe exercises and she will continue to avoid repetitive overhead activity until pain resolves.  If symptoms persist or once again worsen then I would consider merits of diagnostic imaging at that time.  Patient will follow-up for ongoing or recalcitrant issues.

## 2018-03-07 ENCOUNTER — Telehealth: Payer: Self-pay

## 2018-03-07 DIAGNOSIS — M25512 Pain in left shoulder: Secondary | ICD-10-CM

## 2018-03-07 NOTE — Addendum Note (Signed)
Addended by: Jolinda Croak E on: 03/07/2018 04:57 PM   Modules accepted: Orders

## 2018-03-07 NOTE — Telephone Encounter (Signed)
MRI ordered at [place]. Pt will call to schedule appt.

## 2018-03-07 NOTE — Telephone Encounter (Signed)
[employer] requires an x-ray to be performed prior to getting an MRI. Left VM with pt letting her know we put the x-ray order in and she should get it done tomorrow if possible, so we can work on the prior auth for her MRI.

## 2018-03-09 ENCOUNTER — Ambulatory Visit
Admission: RE | Admit: 2018-03-09 | Discharge: 2018-03-09 | Disposition: A | Discharge status: Home / Self Care | Payer: BC Managed Care – PPO | Source: Ambulatory Visit | Attending: Sports Medicine | Admitting: Sports Medicine

## 2018-03-09 DIAGNOSIS — M25512 Pain in left shoulder: Secondary | ICD-10-CM

## 2018-03-12 ENCOUNTER — Ambulatory Visit
Admission: RE | Admit: 2018-03-12 | Discharge: 2018-03-12 | Disposition: A | Discharge status: Home / Self Care | Payer: BC Managed Care – PPO | Source: Ambulatory Visit | Attending: Sports Medicine | Admitting: Sports Medicine

## 2018-03-12 DIAGNOSIS — M25512 Pain in left shoulder: Secondary | ICD-10-CM

## 2018-03-16 ENCOUNTER — Telehealth: Payer: Self-pay | Admitting: Sports Medicine

## 2018-03-16 ENCOUNTER — Other Ambulatory Visit: Payer: Self-pay

## 2018-03-16 DIAGNOSIS — M25512 Pain in left shoulder: Secondary | ICD-10-CM

## 2018-03-16 MED ORDER — METAXALONE 800 MG PO TABS: 800.0000 mg | ORAL_TABLET | Freq: Two times a day (BID) | ORAL | 0 refills | Status: DC | PRN

## 2018-03-16 NOTE — Progress Notes (Signed)
elaxin

## 2018-03-16 NOTE — Addendum Note (Signed)
Addended by: Jolinda Croak E on: 03/16/2018 02:03 PM   Modules accepted: Orders

## 2018-03-16 NOTE — Telephone Encounter (Signed)
  Patient was notified via telephone of MRI findings of her left shoulder.  She has a small focal defect in the subscapularis tendon which appears to be a small full-thickness partial width tear.  Based on these findings I recommended consultation with Dr. Mardelle Matte to discuss further treatment options.  In the meantime, she is asking for a muscle relaxer so I have agreed to prescribe Skelaxin 800 mg twice daily as needed.  Follow-up with me PRN.

## 2018-08-12 ENCOUNTER — Other Ambulatory Visit: Payer: Self-pay | Admitting: Orthopedic Surgery

## 2018-08-12 NOTE — Pre-Procedure Instructions (Addendum)
[name]  08/12/2018      [id number] [address], [place] [address] Phone: [phone] Fax: [phone]    Your procedure is scheduled on August 18, 2018.  Report to [facility] Entrance "A" at 830 AM.  Call this number if you have problems the morning of surgery:  [phone]   Remember:  Do not eat or drink after midnight.  You may drink clear liquids until 0730 AM.  Clear liquids allowed are:  Ensure Pre-surgery drink,   Water, Juice (non-citric and without pulp), Clear Tea, Black Coffee only and Gatorade   Please complete your PRE-SURGERY ENSURE that was provided to you by 7:30 AM the morning of surgery.  Please, if able, drink it in one setting. DO NOT SIP.    Take these medicines the morning of surgery with A SIP OF WATER  Tylenol-if needed Benadryl-if needed Gabapentin (neurontin) Sertraline (zoloft) Tizanidine (zanaflex)  Beginning now, STOP taking any Aspirin (unless otherwise instructed by your surgeon), Aleve, Naproxen, Ibuprofen, Motrin, Advil, Goody's, BC's, all herbal medications, fish oil, and all vitamins    Day of surgery:  Do not wear jewelry, make-up or nail polish.  Do not wear lotions, powders, or perfumes, or deodorant.  Do not shave 48 hours prior to surgery.    Do not bring valuables to the hospital.  [facility] is not responsible for any belongings or valuables.  Contacts, dentures or bridgework may not be worn into surgery.  Leave your suitcase in the car.  After surgery it may be brought to your room.  For patients admitted to the hospital, discharge time will be determined by your treatment team.  Patients discharged the day of surgery will not be allowed to drive home.    [place]- Preparing For Surgery  Before surgery, you can play an important role. Because skin is not sterile, your skin needs to be as free of germs as possible. You can reduce the number  of germs on your skin by washing with CHG (chlorahexidine gluconate) Soap before surgery.  CHG is an antiseptic cleaner which kills germs and bonds with the skin to continue killing germs even after washing.    Oral Hygiene is also important to reduce your risk of infection.  Remember - BRUSH YOUR TEETH THE MORNING OF SURGERY WITH YOUR REGULAR TOOTHPASTE  Please do not use if you have an allergy to CHG or antibacterial soaps. If your skin becomes reddened/irritated stop using the CHG.  Do not shave (including legs and underarms) for at least 48 hours prior to first CHG shower. It is OK to shave your face.  Please follow these instructions carefully.   1. Shower the NIGHT BEFORE SURGERY and the MORNING OF SURGERY with CHG.   2. If you chose to wash your hair, wash your hair first as usual with your normal shampoo.  3. After you shampoo, rinse your hair and body thoroughly to remove the shampoo.  4. Use CHG as you would any other liquid soap. You can apply CHG directly to the skin and wash gently with a scrungie or a clean washcloth.   5. Apply the CHG Soap to your body ONLY FROM THE NECK DOWN.  Do not use on open wounds or open sores. Avoid contact with your eyes, ears, mouth and genitals (private parts). Wash Face and genitals (private parts)  with your normal soap.  6. Wash thoroughly, paying special attention  to the area where your surgery will be performed.  7. Thoroughly rinse your body with warm water from the neck down.  8. DO NOT shower/wash with your normal soap after using and rinsing off the CHG Soap.  9. Pat yourself dry with a CLEAN TOWEL.  10. Wear CLEAN PAJAMAS to bed the night before surgery, wear comfortable clothes the morning of surgery  11. Place CLEAN SHEETS on your bed the night of your first shower and DO NOT SLEEP WITH PETS.  Day of Surgery: Shower as above Do not apply any deodorants/lotions.  Please wear clean clothes to the hospital/surgery center.    Remember to brush your teeth WITH YOUR REGULAR TOOTHPASTE.  Please read over the following fact sheets that you were given.

## 2018-08-15 ENCOUNTER — Other Ambulatory Visit: Payer: Self-pay

## 2018-08-15 ENCOUNTER — Encounter (HOSPITAL_COMMUNITY): Payer: Self-pay

## 2018-08-15 ENCOUNTER — Encounter (HOSPITAL_COMMUNITY)
Admission: RE | Admit: 2018-08-15 | Discharge: 2018-08-15 | Disposition: A | Discharge status: Home / Self Care | Payer: BC Managed Care – PPO | Source: Ambulatory Visit | Attending: Orthopedic Surgery | Admitting: Orthopedic Surgery

## 2018-08-15 ENCOUNTER — Other Ambulatory Visit (HOSPITAL_COMMUNITY)
Admission: RE | Admit: 2018-08-15 | Discharge: 2018-08-15 | Disposition: A | Discharge status: Home / Self Care | Payer: BC Managed Care – PPO | Source: Ambulatory Visit | Attending: Orthopedic Surgery | Admitting: Orthopedic Surgery

## 2018-08-15 DIAGNOSIS — Z01812 Encounter for preprocedural laboratory examination: Secondary | ICD-10-CM | POA: Insufficient documentation

## 2018-08-15 DIAGNOSIS — Z1159 Encounter for screening for other viral diseases: Secondary | ICD-10-CM | POA: Insufficient documentation

## 2018-08-15 LAB — COMPREHENSIVE METABOLIC PANEL
ALT: 13 U/L (ref 0–44)
AST: 18 U/L (ref 15–41)
Albumin: 4.2 g/dL (ref 3.5–5.0)
Alkaline Phosphatase: 55 U/L (ref 38–126)
Anion gap: 13 (ref 5–15)
BUN: 11 mg/dL (ref 6–20)
CO2: 18 mmol/L — ABNORMAL LOW (ref 22–32)
Calcium: 9.1 mg/dL (ref 8.9–10.3)
Chloride: 101 mmol/L (ref 98–111)
Creatinine, Ser: 1.02 mg/dL — ABNORMAL HIGH (ref 0.44–1.00)
GFR calc Af Amer: >60 mL/min (ref >60)
GFR calc non Af Amer: >60 mL/min (ref >60)
Glucose, Bld: 99 mg/dL (ref 70–99)
Potassium: 3.7 mmol/L (ref 3.5–5.1)
Sodium: 132 mmol/L — ABNORMAL LOW (ref 135–145)
Total Bilirubin: 0.5 mg/dL (ref 0.3–1.2)
Total Protein: 7.5 g/dL (ref 6.5–8.1)

## 2018-08-15 LAB — CBC WITH DIFFERENTIAL/PLATELET
Abs Immature Granulocytes: 0.02 10*3/uL (ref 0.00–0.07)
Basophils Absolute: 0 10*3/uL (ref 0.0–0.1)
Basophils Relative: 1 %
Eosinophils Absolute: 0.1 10*3/uL (ref 0.0–0.5)
Eosinophils Relative: 1 %
HCT: 43.2 % (ref 36.0–46.0)
Hemoglobin: 14.2 g/dL (ref 12.0–15.0)
Immature Granulocytes: 0 %
Lymphocytes Relative: 38 %
Lymphs Abs: 3.1 10*3/uL (ref 0.7–4.0)
MCH: 30.7 pg (ref 26.0–34.0)
MCHC: 32.9 g/dL (ref 30.0–36.0)
MCV: 93.5 fL (ref 80.0–100.0)
Monocytes Absolute: 0.5 10*3/uL (ref 0.1–1.0)
Monocytes Relative: 7 %
Neutro Abs: 4.2 10*3/uL (ref 1.7–7.7)
Neutrophils Relative %: 53 %
Platelets: 250 10*3/uL (ref 150–400)
RBC: 4.62 MIL/uL (ref 3.87–5.11)
RDW: 12.7 % (ref 11.5–15.5)
WBC: 8 10*3/uL (ref 4.0–10.5)
nRBC: 0 % (ref 0.0–0.2)

## 2018-08-15 LAB — URINALYSIS, ROUTINE W REFLEX MICROSCOPIC
Bilirubin Urine: NEGATIVE
Glucose, UA: NEGATIVE mg/dL
Hgb urine dipstick: NEGATIVE
Ketones, ur: NEGATIVE mg/dL
Leukocytes,Ua: NEGATIVE
Nitrite: NEGATIVE
Protein, ur: 30 mg/dL — AB
Specific Gravity, Urine: 1.029 (ref 1.005–1.030)
pH: 5 (ref 5.0–8.0)

## 2018-08-15 LAB — TYPE AND SCREEN
ABO/RH(D): A POS
Antibody Screen: NEGATIVE

## 2018-08-15 LAB — SURGICAL PCR SCREEN
MRSA, PCR: NEGATIVE
Staphylococcus aureus: NEGATIVE

## 2018-08-15 LAB — ABO/RH: ABO/RH(D): A POS

## 2018-08-15 LAB — PROTIME-INR
INR: 1 (ref 0.8–1.2)
Prothrombin Time: 13.3 seconds (ref 11.4–15.2)

## 2018-08-15 LAB — APTT: aPTT: 31 seconds (ref 24–36)

## 2018-08-15 NOTE — Progress Notes (Addendum)
PCP - Delia Chimes, MD Cardiologist - denies  Chest x-ray - 01/18/18 1-view EKG - denies  Stress Test - denies ECHO - denies  Cardiac Cath - denies  Sleep Study - denies CPAP - n/a  Fasting Blood Sugar - n/a Checks Blood Sugar _____ times a day-n/a  Blood Thinner Instructions: n/a Aspirin Instructions: n/a  Anesthesia review:No  Patient denies shortness of breath, fever, cough and chest pain at PAT appointment  Patient verbalized understanding of instructions that were given to them at the PAT appointment. Patient was also instructed that they will need to review over the PAT instructions again at home before surgery.  Coronavirus Screening  Have you experienced the following symptoms:  Cough yes/no: No Fever (>100.32F)  yes/no: No Runny nose yes/no: No Sore throat yes/no: No Difficulty breathing/shortness of breath  yes/no: No  Have you or a family member traveled in the last 14 days and where? yes/no: No   If the patient is not experiencing any of these symptoms, the PAT nurse will instruct them to NOT bring anyone with them to their appointment since they may have these symptoms or traveled as well.   Please remind your patients and families that hospital visitation restrictions are in effect and the importance of the restrictions.

## 2018-08-16 LAB — SARS CORONAVIRUS 2 (TAT 6-24 HRS): SARS Coronavirus 2: NEGATIVE

## 2018-08-18 ENCOUNTER — Encounter (HOSPITAL_COMMUNITY): Payer: Self-pay | Admitting: *Deleted

## 2018-08-18 ENCOUNTER — Ambulatory Visit (HOSPITAL_COMMUNITY): Payer: BC Managed Care – PPO | Admitting: Anesthesiology

## 2018-08-18 ENCOUNTER — Other Ambulatory Visit: Payer: Self-pay

## 2018-08-18 ENCOUNTER — Observation Stay (HOSPITAL_COMMUNITY): Payer: BC Managed Care – PPO

## 2018-08-18 ENCOUNTER — Encounter (HOSPITAL_COMMUNITY)
Admission: RE | Disposition: A | Discharge status: Home / Self Care | Payer: Self-pay | Source: Home / Self Care | Attending: Orthopedic Surgery

## 2018-08-18 ENCOUNTER — Ambulatory Visit (HOSPITAL_COMMUNITY): Payer: BC Managed Care – PPO

## 2018-08-18 ENCOUNTER — Observation Stay (HOSPITAL_COMMUNITY)
Admission: RE | Admit: 2018-08-18 | Discharge: 2018-08-20 | Disposition: A | Discharge status: Home / Self Care | Payer: BC Managed Care – PPO | Attending: Orthopedic Surgery | Admitting: Orthopedic Surgery

## 2018-08-18 DIAGNOSIS — M5412 Radiculopathy, cervical region: Secondary | ICD-10-CM | POA: Insufficient documentation

## 2018-08-18 DIAGNOSIS — E785 Hyperlipidemia, unspecified: Secondary | ICD-10-CM | POA: Insufficient documentation

## 2018-08-18 DIAGNOSIS — M5410 Radiculopathy, site unspecified: Secondary | ICD-10-CM | POA: Diagnosis present

## 2018-08-18 DIAGNOSIS — J9601 Acute respiratory failure with hypoxia: Secondary | ICD-10-CM | POA: Insufficient documentation

## 2018-08-18 DIAGNOSIS — Z419 Encounter for procedure for purposes other than remedying health state, unspecified: Secondary | ICD-10-CM

## 2018-08-18 DIAGNOSIS — Z8249 Family history of ischemic heart disease and other diseases of the circulatory system: Secondary | ICD-10-CM | POA: Insufficient documentation

## 2018-08-18 DIAGNOSIS — Z79899 Other long term (current) drug therapy: Secondary | ICD-10-CM | POA: Diagnosis not present

## 2018-08-18 DIAGNOSIS — J811 Chronic pulmonary edema: Secondary | ICD-10-CM

## 2018-08-18 DIAGNOSIS — F419 Anxiety disorder, unspecified: Secondary | ICD-10-CM | POA: Insufficient documentation

## 2018-08-18 DIAGNOSIS — K219 Gastro-esophageal reflux disease without esophagitis: Secondary | ICD-10-CM | POA: Diagnosis not present

## 2018-08-18 DIAGNOSIS — Z791 Long term (current) use of non-steroidal anti-inflammatories (NSAID): Secondary | ICD-10-CM | POA: Insufficient documentation

## 2018-08-18 DIAGNOSIS — Z825 Family history of asthma and other chronic lower respiratory diseases: Secondary | ICD-10-CM | POA: Diagnosis not present

## 2018-08-18 DIAGNOSIS — Z905 Acquired absence of kidney: Secondary | ICD-10-CM | POA: Insufficient documentation

## 2018-08-18 DIAGNOSIS — Z90710 Acquired absence of both cervix and uterus: Secondary | ICD-10-CM | POA: Insufficient documentation

## 2018-08-18 DIAGNOSIS — Z833 Family history of diabetes mellitus: Secondary | ICD-10-CM | POA: Insufficient documentation

## 2018-08-18 DIAGNOSIS — Z809 Family history of malignant neoplasm, unspecified: Secondary | ICD-10-CM | POA: Diagnosis not present

## 2018-08-18 DIAGNOSIS — K449 Diaphragmatic hernia without obstruction or gangrene: Secondary | ICD-10-CM | POA: Insufficient documentation

## 2018-08-18 DIAGNOSIS — R7981 Abnormal blood-gas level: Secondary | ICD-10-CM

## 2018-08-18 DIAGNOSIS — F329 Major depressive disorder, single episode, unspecified: Secondary | ICD-10-CM | POA: Insufficient documentation

## 2018-08-18 DIAGNOSIS — M1990 Unspecified osteoarthritis, unspecified site: Secondary | ICD-10-CM | POA: Insufficient documentation

## 2018-08-18 DIAGNOSIS — Z818 Family history of other mental and behavioral disorders: Secondary | ICD-10-CM | POA: Insufficient documentation

## 2018-08-18 DIAGNOSIS — M4802 Spinal stenosis, cervical region: Secondary | ICD-10-CM | POA: Diagnosis not present

## 2018-08-18 HISTORY — PX: ANTERIOR CERVICAL DECOMPRESSION/DISCECTOMY FUSION 4 LEVELS: SHX5556

## 2018-08-18 SURGERY — ANTERIOR CERVICAL DECOMPRESSION/DISCECTOMY FUSION 4 LEVELS
Anesthesia: General | Site: Spine Cervical

## 2018-08-18 MED ORDER — ALBUTEROL SULFATE (2.5 MG/3ML) 0.083% IN NEBU
INHALATION_SOLUTION | RESPIRATORY_TRACT | Status: AC
  Filled 2018-08-18: qty 3

## 2018-08-18 MED ORDER — LIDOCAINE 2% (20 MG/ML) 5 ML SYRINGE
INTRAMUSCULAR | Status: AC
  Filled 2018-08-18: qty 5

## 2018-08-18 MED ORDER — PROPOFOL 10 MG/ML IV BOLUS
INTRAVENOUS | Status: AC
  Filled 2018-08-18: qty 20

## 2018-08-18 MED ORDER — ACETAMINOPHEN 500 MG PO TABS
1000.0000 mg | ORAL_TABLET | Freq: Once | ORAL | Status: AC
  Administered 2018-08-18: 1000 mg via ORAL

## 2018-08-18 MED ORDER — SODIUM CHLORIDE 0.9% FLUSH: 3.0000 mL | INTRAVENOUS | Status: DC | PRN

## 2018-08-18 MED ORDER — DOCUSATE SODIUM 100 MG PO CAPS
100.0000 mg | ORAL_CAPSULE | Freq: Two times a day (BID) | ORAL | Status: DC
  Administered 2018-08-19 – 2018-08-20 (×3): 100 mg via ORAL
  Filled 2018-08-18 (×3): qty 1

## 2018-08-18 MED ORDER — BUPIVACAINE-EPINEPHRINE 0.25% -1:200000 IJ SOLN
INTRAMUSCULAR | Status: DC | PRN
  Administered 2018-08-18: 30 mL

## 2018-08-18 MED ORDER — LACTATED RINGERS IV SOLN
INTRAVENOUS | Status: DC
  Administered 2018-08-18 (×2): via INTRAVENOUS

## 2018-08-18 MED ORDER — SODIUM CHLORIDE 0.9% FLUSH
3.0000 mL | Freq: Two times a day (BID) | INTRAVENOUS | Status: DC
  Administered 2018-08-18 – 2018-08-19 (×3): 3 mL via INTRAVENOUS

## 2018-08-18 MED ORDER — FUROSEMIDE 10 MG/ML IJ SOLN
10.0000 mg | Freq: Once | INTRAMUSCULAR | Status: AC
  Administered 2018-08-18: 17:00:00 10 mg via INTRAVENOUS

## 2018-08-18 MED ORDER — PHENYLEPHRINE HCL (PRESSORS) 10 MG/ML IV SOLN: INTRAVENOUS | Status: DC | PRN

## 2018-08-18 MED ORDER — PANTOPRAZOLE SODIUM 40 MG IV SOLR
40.0000 mg | Freq: Every day | INTRAVENOUS | Status: DC
  Administered 2018-08-18: 22:00:00 40 mg via INTRAVENOUS
  Filled 2018-08-18: qty 40

## 2018-08-18 MED ORDER — POVIDONE-IODINE 7.5 % EX SOLN
Freq: Once | CUTANEOUS | Status: DC
  Filled 2018-08-18: qty 118

## 2018-08-18 MED ORDER — SUFENTANIL CITRATE 50 MCG/ML IV SOLN
INTRAVENOUS | Status: DC | PRN
  Administered 2018-08-18: 20 ug via INTRAVENOUS
  Administered 2018-08-18 (×2): 10 ug via INTRAVENOUS

## 2018-08-18 MED ORDER — BUPIVACAINE-EPINEPHRINE (PF) 0.25% -1:200000 IJ SOLN
INTRAMUSCULAR | Status: AC
  Filled 2018-08-18: qty 30

## 2018-08-18 MED ORDER — PHENYLEPHRINE 40 MCG/ML (10ML) SYRINGE FOR IV PUSH (FOR BLOOD PRESSURE SUPPORT)
PREFILLED_SYRINGE | INTRAVENOUS | Status: AC
  Filled 2018-08-18: qty 10

## 2018-08-18 MED ORDER — FENTANYL CITRATE (PF) 100 MCG/2ML IJ SOLN
INTRAMUSCULAR | Status: AC
  Filled 2018-08-18: qty 2

## 2018-08-18 MED ORDER — SCOPOLAMINE 1 MG/3DAYS TD PT72
1.0000 | MEDICATED_PATCH | Freq: Once | TRANSDERMAL | Status: DC
  Administered 2018-08-18: 1.5 mg via TRANSDERMAL
  Filled 2018-08-18: qty 1

## 2018-08-18 MED ORDER — CEFAZOLIN SODIUM-DEXTROSE 2-4 GM/100ML-% IV SOLN
2.0000 g | INTRAVENOUS | Status: AC
  Administered 2018-08-18: 2 g via INTRAVENOUS
  Filled 2018-08-18: qty 100

## 2018-08-18 MED ORDER — FLEET ENEMA 7-19 GM/118ML RE ENEM: 1.0000 | ENEMA | Freq: Once | RECTAL | Status: DC | PRN

## 2018-08-18 MED ORDER — PROPOFOL 10 MG/ML IV BOLUS
INTRAVENOUS | Status: DC | PRN
  Administered 2018-08-18: 150 mg via INTRAVENOUS

## 2018-08-18 MED ORDER — PHENOL 1.4 % MT LIQD
1.0000 | OROMUCOSAL | Status: DC | PRN
  Administered 2018-08-18: 1 via OROMUCOSAL
  Filled 2018-08-18: qty 177

## 2018-08-18 MED ORDER — GABAPENTIN 300 MG PO CAPS
300.0000 mg | ORAL_CAPSULE | Freq: Three times a day (TID) | ORAL | Status: DC
  Administered 2018-08-18 – 2018-08-20 (×5): 300 mg via ORAL
  Filled 2018-08-18 (×5): qty 1

## 2018-08-18 MED ORDER — ALBUMIN HUMAN 5 % IV SOLN
INTRAVENOUS | Status: DC | PRN
  Administered 2018-08-18: 13:00:00 via INTRAVENOUS

## 2018-08-18 MED ORDER — ALUM & MAG HYDROXIDE-SIMETH 200-200-20 MG/5ML PO SUSP: 30.0000 mL | Freq: Four times a day (QID) | ORAL | Status: DC | PRN

## 2018-08-18 MED ORDER — CEFAZOLIN SODIUM-DEXTROSE 2-4 GM/100ML-% IV SOLN
INTRAVENOUS | Status: AC
  Filled 2018-08-18: qty 100

## 2018-08-18 MED ORDER — ONDANSETRON HCL 4 MG/2ML IJ SOLN
INTRAMUSCULAR | Status: DC | PRN
  Administered 2018-08-18: 4 mg via INTRAVENOUS

## 2018-08-18 MED ORDER — FUROSEMIDE 10 MG/ML IJ SOLN
10.0000 mg | Freq: Once | INTRAMUSCULAR | Status: AC
  Administered 2018-08-18: 10 mg via INTRAVENOUS

## 2018-08-18 MED ORDER — CEFAZOLIN SODIUM-DEXTROSE 2-4 GM/100ML-% IV SOLN
2.0000 g | Freq: Three times a day (TID) | INTRAVENOUS | Status: AC
  Administered 2018-08-19: 04:00:00 2 g via INTRAVENOUS
  Filled 2018-08-18 (×2): qty 100

## 2018-08-18 MED ORDER — FENTANYL CITRATE (PF) 100 MCG/2ML IJ SOLN
25.0000 ug | INTRAMUSCULAR | Status: DC | PRN
  Administered 2018-08-18 (×3): 25 ug via INTRAVENOUS

## 2018-08-18 MED ORDER — DIPHENHYDRAMINE HCL 25 MG PO CAPS: 50.0000 mg | ORAL_CAPSULE | Freq: Four times a day (QID) | ORAL | Status: DC | PRN

## 2018-08-18 MED ORDER — BISACODYL 5 MG PO TBEC: 5.0000 mg | DELAYED_RELEASE_TABLET | Freq: Every day | ORAL | Status: DC | PRN

## 2018-08-18 MED ORDER — ALBUTEROL SULFATE (2.5 MG/3ML) 0.083% IN NEBU: 2.5000 mg | INHALATION_SOLUTION | Freq: Four times a day (QID) | RESPIRATORY_TRACT | Status: DC | PRN

## 2018-08-18 MED ORDER — MIDAZOLAM HCL 5 MG/5ML IJ SOLN
INTRAMUSCULAR | Status: DC | PRN
  Administered 2018-08-18: 2 mg via INTRAVENOUS

## 2018-08-18 MED ORDER — ONDANSETRON HCL 4 MG PO TABS: 4.0000 mg | ORAL_TABLET | Freq: Four times a day (QID) | ORAL | Status: DC | PRN

## 2018-08-18 MED ORDER — ONDANSETRON HCL 4 MG/2ML IJ SOLN
INTRAMUSCULAR | Status: AC
  Filled 2018-08-18: qty 2

## 2018-08-18 MED ORDER — FUROSEMIDE 10 MG/ML IJ SOLN
INTRAMUSCULAR | Status: AC
  Filled 2018-08-18: qty 4

## 2018-08-18 MED ORDER — ACETAMINOPHEN 500 MG PO TABS
ORAL_TABLET | ORAL | Status: AC
  Administered 2018-08-18: 1000 mg via ORAL
  Filled 2018-08-18: qty 2

## 2018-08-18 MED ORDER — THROMBIN 20000 UNITS EX KIT
PACK | CUTANEOUS | Status: AC
  Filled 2018-08-18: qty 1

## 2018-08-18 MED ORDER — 0.9 % SODIUM CHLORIDE (POUR BTL) OPTIME
TOPICAL | Status: DC | PRN
  Administered 2018-08-18 (×2): 1000 mL

## 2018-08-18 MED ORDER — SUGAMMADEX SODIUM 200 MG/2ML IV SOLN
INTRAVENOUS | Status: DC | PRN
  Administered 2018-08-18: 200 mg via INTRAVENOUS

## 2018-08-18 MED ORDER — SENNOSIDES-DOCUSATE SODIUM 8.6-50 MG PO TABS: 1.0000 | ORAL_TABLET | Freq: Every evening | ORAL | Status: DC | PRN

## 2018-08-18 MED ORDER — HEMOSTATIC AGENTS (NO CHARGE) OPTIME
TOPICAL | Status: DC | PRN
  Administered 2018-08-18: 1

## 2018-08-18 MED ORDER — ROCURONIUM BROMIDE 10 MG/ML (PF) SYRINGE
PREFILLED_SYRINGE | INTRAVENOUS | Status: AC
  Filled 2018-08-18: qty 10

## 2018-08-18 MED ORDER — SCOPOLAMINE 1 MG/3DAYS TD PT72
MEDICATED_PATCH | TRANSDERMAL | Status: AC
  Administered 2018-08-18: 11:00:00 1.5 mg via TRANSDERMAL
  Filled 2018-08-18: qty 1

## 2018-08-18 MED ORDER — SUFENTANIL CITRATE 50 MCG/ML IV SOLN
INTRAVENOUS | Status: AC
  Filled 2018-08-18: qty 1

## 2018-08-18 MED ORDER — LIDOCAINE 2% (20 MG/ML) 5 ML SYRINGE
INTRAMUSCULAR | Status: DC | PRN
  Administered 2018-08-18: 60 mg via INTRAVENOUS

## 2018-08-18 MED ORDER — ZOLPIDEM TARTRATE 5 MG PO TABS
5.0000 mg | ORAL_TABLET | Freq: Every evening | ORAL | Status: DC | PRN
  Administered 2018-08-18: 5 mg via ORAL
  Filled 2018-08-18: qty 1

## 2018-08-18 MED ORDER — SERTRALINE HCL 50 MG PO TABS
125.0000 mg | ORAL_TABLET | Freq: Every day | ORAL | Status: DC
  Administered 2018-08-19 – 2018-08-20 (×2): 125 mg via ORAL
  Filled 2018-08-18 (×2): qty 1

## 2018-08-18 MED ORDER — POLYETHYLENE GLYCOL 3350 17 G PO PACK: 17.0000 g | PACK | Freq: Every day | ORAL | Status: DC | PRN

## 2018-08-18 MED ORDER — DIPHENHYDRAMINE HCL 25 MG PO TABS
50.0000 mg | ORAL_TABLET | Freq: Four times a day (QID) | ORAL | Status: DC | PRN
  Filled 2018-08-18: qty 2

## 2018-08-18 MED ORDER — DIAZEPAM 5 MG PO TABS
5.0000 mg | ORAL_TABLET | Freq: Four times a day (QID) | ORAL | Status: DC | PRN
  Administered 2018-08-18 – 2018-08-19 (×2): 5 mg via ORAL
  Filled 2018-08-18 (×2): qty 1

## 2018-08-18 MED ORDER — THROMBIN 20000 UNITS EX SOLR
CUTANEOUS | Status: DC | PRN
  Administered 2018-08-18: 12:00:00 20000 [IU] via TOPICAL

## 2018-08-18 MED ORDER — ROCURONIUM BROMIDE 10 MG/ML (PF) SYRINGE
PREFILLED_SYRINGE | INTRAVENOUS | Status: DC | PRN
  Administered 2018-08-18: 10 mg via INTRAVENOUS
  Administered 2018-08-18: 60 mg via INTRAVENOUS

## 2018-08-18 MED ORDER — ACETAMINOPHEN 650 MG RE SUPP: 650.0000 mg | RECTAL | Status: DC | PRN

## 2018-08-18 MED ORDER — SODIUM CHLORIDE 0.9 % IV SOLN
250.0000 mL | INTRAVENOUS | Status: DC
  Administered 2018-08-19: 250 mL via INTRAVENOUS

## 2018-08-18 MED ORDER — PHENYLEPHRINE 40 MCG/ML (10ML) SYRINGE FOR IV PUSH (FOR BLOOD PRESSURE SUPPORT)
PREFILLED_SYRINGE | INTRAVENOUS | Status: DC | PRN
  Administered 2018-08-18 (×4): 80 ug via INTRAVENOUS
  Administered 2018-08-18: 40 ug via INTRAVENOUS

## 2018-08-18 MED ORDER — ACETAMINOPHEN 325 MG PO TABS: 650.0000 mg | ORAL_TABLET | ORAL | Status: DC | PRN

## 2018-08-18 MED ORDER — MIDAZOLAM HCL 2 MG/2ML IJ SOLN
INTRAMUSCULAR | Status: AC
  Filled 2018-08-18: qty 2

## 2018-08-18 MED ORDER — OXYCODONE-ACETAMINOPHEN 5-325 MG PO TABS
1.0000 | ORAL_TABLET | ORAL | Status: DC | PRN
  Administered 2018-08-18 – 2018-08-20 (×6): 2 via ORAL
  Filled 2018-08-18 (×6): qty 2

## 2018-08-18 MED ORDER — ONDANSETRON HCL 4 MG/2ML IJ SOLN
4.0000 mg | Freq: Four times a day (QID) | INTRAMUSCULAR | Status: DC | PRN
  Administered 2018-08-18: 4 mg via INTRAVENOUS
  Filled 2018-08-18: qty 2

## 2018-08-18 MED ORDER — MENTHOL 3 MG MT LOZG
1.0000 | LOZENGE | OROMUCOSAL | Status: DC | PRN
  Filled 2018-08-18: qty 9

## 2018-08-18 SURGICAL SUPPLY — 75 items
BENZOIN TINCTURE PRP APPL 2/3 (GAUZE/BANDAGES/DRESSINGS) ×3 IMPLANT
BIT DRILL NEURO 2X3.1 SFT TUCH (MISCELLANEOUS) ×1 IMPLANT
BIT DRILL SRG 14X2.2XFLT CHK (BIT) ×1 IMPLANT
BIT DRL SRG 14X2.2XFLT CHK (BIT) ×1
BLADE CLIPPER SURG (BLADE) ×3 IMPLANT
BLADE SURG 15 STRL LF DISP TIS (BLADE) ×1 IMPLANT
BLADE SURG 15 STRL SS (BLADE) ×2
BONE VIVIGEN FORMABLE 1.3CC (Bone Implant) ×6 IMPLANT
BUR MATCHSTICK NEURO 3.0 LAGG (BURR) IMPLANT
CARTRIDGE OIL MAESTRO DRILL (MISCELLANEOUS) ×1 IMPLANT
CLOSURE STERI-STRIP 1/2X4 (GAUZE/BANDAGES/DRESSINGS)
CLOSURE WOUND 1/2 X4 (GAUZE/BANDAGES/DRESSINGS) ×1
CLSR STERI-STRIP ANTIMIC 1/2X4 (GAUZE/BANDAGES/DRESSINGS) IMPLANT
COVER SURGICAL LIGHT HANDLE (MISCELLANEOUS) ×3 IMPLANT
COVER WAND RF STERILE (DRAPES) ×3 IMPLANT
DECANTER SPIKE VIAL GLASS SM (MISCELLANEOUS) ×3 IMPLANT
DIFFUSER DRILL AIR PNEUMATIC (MISCELLANEOUS) ×3 IMPLANT
DRAIN JACKSON RD 7FR 3/32 (WOUND CARE) IMPLANT
DRAPE C-ARM 42X72 X-RAY (DRAPES) ×3 IMPLANT
DRAPE POUCH INSTRU U-SHP 10X18 (DRAPES) ×3 IMPLANT
DRAPE SURG 17X23 STRL (DRAPES) ×9 IMPLANT
DRILL BIT SKYLINE 14MM (BIT) ×2
DRILL NEURO 2X3.1 SOFT TOUCH (MISCELLANEOUS) ×3
DURAPREP 26ML APPLICATOR (WOUND CARE) ×3 IMPLANT
ELECT COATED BLADE 2.86 ST (ELECTRODE) ×3 IMPLANT
ELECT REM PT RETURN 9FT ADLT (ELECTROSURGICAL) ×3
ELECTRODE REM PT RTRN 9FT ADLT (ELECTROSURGICAL) ×1 IMPLANT
EVACUATOR SILICONE 100CC (DRAIN) IMPLANT
GAUZE 4X4 16PLY RFD (DISPOSABLE) ×3 IMPLANT
GAUZE SPONGE 4X4 12PLY STRL (GAUZE/BANDAGES/DRESSINGS) ×3 IMPLANT
GAUZE SPONGE 4X4 12PLY STRL LF (GAUZE/BANDAGES/DRESSINGS) ×3 IMPLANT
GLOVE BIO SURGEON STRL SZ7 (GLOVE) ×6 IMPLANT
GLOVE BIO SURGEON STRL SZ8 (GLOVE) ×3 IMPLANT
GLOVE BIOGEL PI IND STRL 7.0 (GLOVE) ×2 IMPLANT
GLOVE BIOGEL PI IND STRL 8 (GLOVE) ×1 IMPLANT
GLOVE BIOGEL PI INDICATOR 7.0 (GLOVE) ×4
GLOVE BIOGEL PI INDICATOR 8 (GLOVE) ×2
GOWN STRL REUS W/ TWL LRG LVL3 (GOWN DISPOSABLE) ×1 IMPLANT
GOWN STRL REUS W/ TWL XL LVL3 (GOWN DISPOSABLE) ×1 IMPLANT
GOWN STRL REUS W/TWL LRG LVL3 (GOWN DISPOSABLE) ×2
GOWN STRL REUS W/TWL XL LVL3 (GOWN DISPOSABLE) ×2
INTERLOCK LRDTC CRVCL VBR 6MM (Bone Implant) ×3 IMPLANT
IV CATH 14GX2 1/4 (CATHETERS) ×3 IMPLANT
KIT BASIN OR (CUSTOM PROCEDURE TRAY) ×3 IMPLANT
KIT TURNOVER KIT B (KITS) ×3 IMPLANT
LORDOTIC CERVICAL VBR 6MM SM (Bone Implant) ×9 IMPLANT
MANIFOLD NEPTUNE II (INSTRUMENTS) ×3 IMPLANT
NEEDLE PRECISIONGLIDE 27X1.5 (NEEDLE) ×3 IMPLANT
NEEDLE SPNL 20GX3.5 QUINCKE YW (NEEDLE) ×3 IMPLANT
NS IRRIG 1000ML POUR BTL (IV SOLUTION) ×3 IMPLANT
OIL CARTRIDGE MAESTRO DRILL (MISCELLANEOUS) ×3
PACK ORTHO CERVICAL (CUSTOM PROCEDURE TRAY) ×3 IMPLANT
PAD ARMBOARD 7.5X6 YLW CONV (MISCELLANEOUS) ×6 IMPLANT
PATTIES SURGICAL .5 X.5 (GAUZE/BANDAGES/DRESSINGS) IMPLANT
PATTIES SURGICAL .5 X1 (DISPOSABLE) IMPLANT
PIN DISTRACTION 14 (PIN) ×6 IMPLANT
PLATE SKYLINE 3LVL 42MM (Plate) ×3 IMPLANT
POSITIONER HEAD DONUT 9IN (MISCELLANEOUS) ×3 IMPLANT
SCREW VAR SELF TAP SKYLINE 14M (Screw) ×24 IMPLANT
SPONGE INTESTINAL PEANUT (DISPOSABLE) ×6 IMPLANT
SPONGE SURGIFOAM ABS GEL 100 (HEMOSTASIS) ×3 IMPLANT
STRIP CLOSURE SKIN 1/2X4 (GAUZE/BANDAGES/DRESSINGS) ×2 IMPLANT
SURGIFLO W/THROMBIN 8M KIT (HEMOSTASIS) IMPLANT
SUT MNCRL AB 4-0 PS2 18 (SUTURE) ×3 IMPLANT
SUT VIC AB 2-0 CT2 18 VCP726D (SUTURE) ×3 IMPLANT
SYR BULB IRRIGATION 50ML (SYRINGE) ×3 IMPLANT
SYR CONTROL 10ML LL (SYRINGE) ×9 IMPLANT
TAPE CLOTH 4X10 WHT NS (GAUZE/BANDAGES/DRESSINGS) ×3 IMPLANT
TAPE CLOTH SURG 4X10 WHT LF (GAUZE/BANDAGES/DRESSINGS) ×3 IMPLANT
TAPE UMBILICAL COTTON 1/8X30 (MISCELLANEOUS) ×3 IMPLANT
TOWEL GREEN STERILE (TOWEL DISPOSABLE) ×3 IMPLANT
TOWEL GREEN STERILE FF (TOWEL DISPOSABLE) ×3 IMPLANT
TRAY FOLEY MTR SLVR 16FR STAT (SET/KITS/TRAYS/PACK) ×3 IMPLANT
WATER STERILE IRR 1000ML POUR (IV SOLUTION) ×3 IMPLANT
YANKAUER SUCT BULB TIP NO VENT (SUCTIONS) ×3 IMPLANT

## 2018-08-18 NOTE — Transfer of Care (Signed)
Immediate Anesthesia Transfer of Care Note  Patient: [name]  Procedure(s) Performed: ANTERIOR CERVICAL DECOMPRESSION FUSION, CERVICAL THREE TO FOUR, CERVICAL FOUR TO FIVE, CERVICAL FIVE TO SIX WITH INSTRUMENTATION AND ALLOGRAFT. (N/A Spine Cervical)  Patient Location: PACU  Anesthesia Type:General  Level of Consciousness: drowsy and patient cooperative  Airway & Oxygen Therapy: Patient Spontanous Breathing and Patient connected to face mask oxygen  Post-op Assessment: Report given to RN, Post -op Vital signs reviewed and stable and Patient moving all extremities  Post vital signs: Reviewed and stable  Last Vitals:  Vitals Value Taken Time  BP 138/81 08/18/18 1424  Temp    Pulse 122 08/18/18 1429  Resp 25 08/18/18 1429  SpO2 88 % 08/18/18 1429  Vitals shown include unvalidated device data.  Last Pain:  Vitals:   08/18/18 0935  PainSc: 5       Patients Stated Pain Goal: 3 (24/[birth year] 5329)  Complications: No apparent anesthesia complications

## 2018-08-18 NOTE — H&P (Addendum)
PREOPERATIVE H&P  Chief Complaint: Left shoulder and arm pain  HPI: [name] is a 43 y.o. female who presents with ongoing pain in the left shoulder and arm  MRI reveals spinal stenosis spanning C3-6  Patient has failed multiple forms of conservative care and continues to have pain (see office notes for additional details regarding the patient's full course of treatment)      Past Medical History:  Diagnosis Date  . Anxiety   . Depression   . GERD (gastroesophageal reflux disease)   . History of hiatal hernia   . 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 Synovitis of wrist    RIGHT WRIST  . Tear of triangular fibrocartilage complex (TFCC)    RIGHT WRIST        Past Surgical History:  Procedure Laterality Date  . ABDOMINAL HYSTERECTOMY    . BENIGN BREAST BX  1993  . BREAST SURGERY    . CESAREAN SECTION  2001  & 2003   2003  W/  BILATERAL TUBAL LIGATION  . CYSTO/  RIGHT RETROGRADE PYELOGRAM/  RIGHT URETERAL STENT PLACMENT   03-01-2002  . CYSTO/  STENT REMOVAL/  RIGHT ACCUSIZE RETROGRADE URETEROPELVIC JUNCTION INCISION AND STENT REPLACEMENT  12-05-2001  . EYE SURGERY    . HERNIA REPAIR    . LAPAROSCOPIC ASSISTED VAGINAL HYSTERECTOMY  2014  . LEEP/  D & C HYSTEROSCOPY/  NOVASURE ENDOMETRIAL ABLATION  08-23-2006  . REPAIR LEFT TEAR DUCT INJURY  2013  . TOTAL NEPHRECTOMY Right 04-12-2002  . TUBAL LIGATION    . UMBILICAL HERNIA REPAIR  12-11-2008   LAPAROSCOPIC  . WRIST ARTHROSCOPY Right 09/19/2014   Procedure: RIGHT WRIST ARTHROSCOPY AND TFCC ((TRIANGULAR FIBROCARTILAGE COMPLEX) DEBRIDEMENT ;  Surgeon: Iran Planas, MD;  Location: [facility];  Service: Orthopedics;  Laterality: Right;   Social History        Socioeconomic History   . Marital status: Married    Spouse name: [name]  . Number of children: 2  . Years of education: college  . Highest education level: Not on file  Occupational History  . Occupation: Oceanographer    Comment: [employer]  Social Needs  . Financial resource strain: Not on file  . Food insecurity    Worry: Not on file    Inability: Not on file  . Transportation needs    Medical: Not on file    Non-medical: Not on file  Tobacco Use  . Smoking status: Never Smoker  . Smokeless tobacco: Never Used  Substance and Sexual Activity  . Alcohol use: No    Alcohol/week: 0.0 standard drinks  . Drug use: No  . Sexual activity: Yes    Partners: Male    Birth control/protection: Surgical  Lifestyle  . Physical activity    Days per week: Not on file    Minutes per session: Not on file  . Stress: Not on file  Relationships  . Social Herbalist on phone: Not on file    Gets together: Not on file    Attends religious service: Not on file    Active member of club or organization: Not on file    Attends meetings of clubs or organizations: Not on file    Relationship status: Not on file  Other  Topics Concern  . Not on file  Social History Narrative   Degree in Social Work from [employer]. Working on [employer] in Clinical biochemist.   Lives with her husband and their two sons.        Family History  Problem Relation Age of Onset  . Diabetes Father   . Asthma Brother   . Cancer Maternal Grandmother   . Mental illness Mother        anxiety, Bipolar disorder  . Asthma Son   . Diabetes Brother   . Hyperlipidemia Brother   . Asthma Son    No Known Allergies        Prior to Admission medications   Medication Sig Start Date End Date Taking? Authorizing Provider  acetaminophen (TYLENOL) 500 MG tablet Take 1,000 mg by mouth every 6 (six) hours as needed for moderate pain.    Yes [provider]   Calcium Carbonate-Simethicone (ALKA-SELTZER HEARTBURN + GAS) 750-80 MG CHEW Chew 2 tablets by mouth daily as needed (acid reflux).   Yes [provider]  diphenhydrAMINE (BENADRYL) 25 MG tablet Take 50 mg by mouth every 6 (six) hours as needed for allergies.   Yes [provider]  gabapentin (NEURONTIN) 300 MG capsule Take 300 mg by mouth 3 (three) times daily.   Yes [provider]  polyethylene glycol (MIRALAX / GLYCOLAX) packet Take 17 g by mouth daily as needed for moderate constipation.    Yes [provider]  sertraline (ZOLOFT) 50 MG tablet Take 125 mg by mouth daily.  11/26/17  Yes [provider]  tiZANidine (ZANAFLEX) 4 MG tablet Take 4 mg by mouth 3 (three) times daily.   Yes [provider]  ibuprofen (ADVIL,MOTRIN) 800 MG tablet Take 1 tablet (800 mg total) by mouth every 8 (eight) hours as needed. Patient not taking: Reported on 08/12/2018 01/17/18   Rutherford Guys, MD  metaxalone (SKELAXIN) 800 MG tablet Take 1 tablet (800 mg total) by mouth 2 (two) times daily as needed for muscle spasms. Patient not taking: Reported on 08/12/2018 03/16/18   Thurman Coyer, DO  traMADol (ULTRAM) 50 MG tablet Take 1 tablet (50 mg total) by mouth every 8 (eight) hours as needed. Patient not taking: Reported on 08/12/2018 01/17/18   Rutherford Guys, MD     All other systems have been reviewed and were otherwise negative with the exception of those mentioned in the HPI and as above.  Physical Exam: There were no vitals filed for this visit.  There is no height or weight on file to calculate BMI.  General: Alert, no acute distress Cardiovascular: No pedal edema Respiratory: No cyanosis, no use of accessory musculature Skin: No lesions in the area of chief complaint Neurologic: Sensation intact distally Psychiatric: Patient is competent for consent with normal mood and affect Lymphatic: No axillary or cervical  lymphadenopathy   Assessment/Plan: CERVICAL STENOSIS WITH SEVERE DEGENERATIVE DISC DISEASE SPANNING CERVICAL 3- CERVICAL 6. Plan for Procedure(s): ANTERIOR CERVICAL DECOMPRESSION FUSION, CERVICAL 3-4, CERVICAL 4-5, CERVICAL 5-6 WITH INSTRUMENTATION AND ALLOGRAFT.   Norva Karvonen, MD 08/18/2018 6:29 AM

## 2018-08-18 NOTE — Anesthesia Postprocedure Evaluation (Signed)
Anesthesia Post Note  Patient: [name]  Procedure(s) Performed: ANTERIOR CERVICAL DECOMPRESSION FUSION, CERVICAL THREE TO FOUR, CERVICAL FOUR TO FIVE, CERVICAL FIVE TO SIX WITH INSTRUMENTATION AND ALLOGRAFT. (N/A Spine Cervical)     Patient location during evaluation: PACU Anesthesia Type: General Level of consciousness: sedated Pain management: pain level controlled Vital Signs Assessment: post-procedure vital signs reviewed and stable Respiratory status: spontaneous breathing and respiratory function stable Cardiovascular status: stable Postop Assessment: no apparent nausea or vomiting Anesthetic complications: yes Anesthetic complication details: Pt developed pulmonary edema post op.  improved with diuretic.  Will need supplemental oxygen overnight. and anesthesia complications   Last Vitals:  Vitals:   08/18/18 1655 08/18/18 1710  BP: (!) 113/56 120/61  Pulse: 100 100  Resp: (!) 25 (!) 25  Temp:    SpO2: 95% 97%    Last Pain:  Vitals:   08/18/18 1655  PainSc: Asleep                 Rigo Letts DANIEL

## 2018-08-18 NOTE — Anesthesia Procedure Notes (Signed)
Procedure Name: Intubation Date/Time: 08/18/2018 11:27 AM Performed by: Moshe Salisbury, CRNA Pre-anesthesia Checklist: Patient identified, Emergency Drugs available, Suction available and Patient being monitored Patient Re-evaluated:Patient Re-evaluated prior to induction Oxygen Delivery Method: Circle System Utilized Preoxygenation: Pre-oxygenation with 100% oxygen Induction Type: IV induction Ventilation: Mask ventilation without difficulty Laryngoscope Size: Mac and 3 Grade View: Grade II Tube type: Oral Tube size: 7.5 mm Number of attempts: 1 Airway Equipment and Method: Stylet Placement Confirmation: ETT inserted through vocal cords under direct vision,  positive ETCO2 and breath sounds checked- equal and bilateral Secured at: 22 cm Tube secured with: Tape Dental Injury: Teeth and Oropharynx as per pre-operative assessment

## 2018-08-18 NOTE — Progress Notes (Deleted)
PREOPERATIVE H&P  Chief Complaint: Bilateral arm pain  HPI: [name] is a 42 y.o. female who presents with ongoing pain in the bilateral arms  MRI reveals spinal stenosis spanning C3-6  Patient has failed multiple forms of conservative care and continues to have pain (see office notes for additional details regarding the patient's full course of treatment)  Past Medical History:  Diagnosis Date  . Anxiety   . Depression   . GERD (gastroesophageal reflux disease)   . History of hiatal hernia   . 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 Synovitis of wrist    RIGHT WRIST  . Tear of triangular fibrocartilage complex (TFCC)    RIGHT WRIST   Past Surgical History:  Procedure Laterality Date  . ABDOMINAL HYSTERECTOMY    . BENIGN BREAST BX  1993  . BREAST SURGERY    . CESAREAN SECTION  2001  & 2003   2003  W/  BILATERAL TUBAL LIGATION  . CYSTO/  RIGHT RETROGRADE PYELOGRAM/  RIGHT URETERAL STENT PLACMENT   03-01-2002  . CYSTO/  STENT REMOVAL/  RIGHT ACCUSIZE RETROGRADE URETEROPELVIC JUNCTION INCISION AND STENT REPLACEMENT  12-05-2001  . EYE SURGERY    . HERNIA REPAIR    . LAPAROSCOPIC ASSISTED VAGINAL HYSTERECTOMY  2014  . LEEP/  D & C HYSTEROSCOPY/  NOVASURE ENDOMETRIAL ABLATION  08-23-2006  . REPAIR LEFT TEAR DUCT INJURY  2013  . TOTAL NEPHRECTOMY Right 04-12-2002  . TUBAL LIGATION    . UMBILICAL HERNIA REPAIR  12-11-2008   LAPAROSCOPIC  . WRIST ARTHROSCOPY Right 09/19/2014   Procedure: RIGHT WRIST ARTHROSCOPY AND TFCC ((TRIANGULAR FIBROCARTILAGE COMPLEX) DEBRIDEMENT ;  Surgeon: Iran Planas, MD;  Location: [facility];  Service: Orthopedics;  Laterality: Right;   Social History   Socioeconomic History  . Marital status: Married    Spouse name: [name]  . Number of  children: 2  . Years of education: college  . Highest education level: Not on file  Occupational History  . Occupation: Oceanographer    Comment: [employer]  Social Needs  . Financial resource strain: Not on file  . Food insecurity    Worry: Not on file    Inability: Not on file  . Transportation needs    Medical: Not on file    Non-medical: Not on file  Tobacco Use  . Smoking status: Never Smoker  . Smokeless tobacco: Never Used  Substance and Sexual Activity  . Alcohol use: No    Alcohol/week: 0.0 standard drinks  . Drug use: No  . Sexual activity: Yes    Partners: Male    Birth control/protection: Surgical  Lifestyle  . Physical activity    Days per week: Not on file    Minutes per session: Not on file  . Stress: Not on file  Relationships  . Social Herbalist on phone: Not on file    Gets together: Not on file    Attends religious service: Not on file    Active member of club or organization: Not on file    Attends meetings of clubs or organizations: Not on file    Relationship status: Not on file  Other Topics Concern  . Not on file  Social History Narrative   Degree in  Social Work from [employer]. Working on [employer] in Clinical biochemist.   Lives with her husband and their two sons.   Family History  Problem Relation Age of Onset  . Diabetes Father   . Asthma Brother   . Cancer Maternal Grandmother   . Mental illness Mother        anxiety, Bipolar disorder  . Asthma Son   . Diabetes Brother   . Hyperlipidemia Brother   . Asthma Son    No Known Allergies Prior to Admission medications   Medication Sig Start Date End Date Taking? Authorizing Provider  acetaminophen (TYLENOL) 500 MG tablet Take 1,000 mg by mouth every 6 (six) hours as needed for moderate pain.    Yes [provider]  Calcium Carbonate-Simethicone (ALKA-SELTZER HEARTBURN + GAS) 750-80 MG CHEW Chew 2 tablets by mouth daily as needed (acid  reflux).   Yes [provider]  diphenhydrAMINE (BENADRYL) 25 MG tablet Take 50 mg by mouth every 6 (six) hours as needed for allergies.   Yes [provider]  gabapentin (NEURONTIN) 300 MG capsule Take 300 mg by mouth 3 (three) times daily.   Yes [provider]  polyethylene glycol (MIRALAX / GLYCOLAX) packet Take 17 g by mouth daily as needed for moderate constipation.    Yes [provider]  sertraline (ZOLOFT) 50 MG tablet Take 125 mg by mouth daily.  11/26/17  Yes [provider]  tiZANidine (ZANAFLEX) 4 MG tablet Take 4 mg by mouth 3 (three) times daily.   Yes [provider]  ibuprofen (ADVIL,MOTRIN) 800 MG tablet Take 1 tablet (800 mg total) by mouth every 8 (eight) hours as needed. Patient not taking: Reported on 08/12/2018 01/17/18   Rutherford Guys, MD  metaxalone (SKELAXIN) 800 MG tablet Take 1 tablet (800 mg total) by mouth 2 (two) times daily as needed for muscle spasms. Patient not taking: Reported on 08/12/2018 03/16/18   Thurman Coyer, DO  traMADol (ULTRAM) 50 MG tablet Take 1 tablet (50 mg total) by mouth every 8 (eight) hours as needed. Patient not taking: Reported on 08/12/2018 01/17/18   Rutherford Guys, MD     All other systems have been reviewed and were otherwise negative with the exception of those mentioned in the HPI and as above.  Physical Exam: There were no vitals filed for this visit.  There is no height or weight on file to calculate BMI.  General: Alert, no acute distress Cardiovascular: No pedal edema Respiratory: No cyanosis, no use of accessory musculature Skin: No lesions in the area of chief complaint Neurologic: Sensation intact distally Psychiatric: Patient is competent for consent with normal mood and affect Lymphatic: No axillary or cervical lymphadenopathy   Assessment/Plan: CERVICAL STENOSIS WITH SEVERE DEGENERATIVE DISC DISEASE SPANNING CERVICAL 3- CERVICAL 6. Plan for Procedure(s):  ANTERIOR CERVICAL DECOMPRESSION FUSION, CERVICAL 3-4, CERVICAL 4-5, CERVICAL 5-6 WITH INSTRUMENTATION AND ALLOGRAFT.   Norva Karvonen, MD 08/18/2018 6:29 AM

## 2018-08-18 NOTE — Op Note (Signed)
PATIENT NAME: [name]   MEDICAL RECORD NO.:   [id number]    PHYSICIAN:  Phylliss Bob, MD      DATE OF BIRTH: January 01, 1976  ASSISTANT: Pricilla Holm, PA-C   DATE OF PROCEDURE: 08/18/2018                               OPERATIVE REPORT     PREOPERATIVE DIAGNOSES: 1. Left-sided cervical radiculopathy. 2. Spinal stenosis spanning C3-C6.   POSTOPERATIVE DIAGNOSES: 1. Left-sided cervical radiculopathy. 2. Spinal stenosis spanning C3-C6.   PROCEDURE: 1. Anterior cervical decompression and fusion C3/4, C4/5, C5/6 2. Placement of anterior instrumentation, C3-C6. 3. Insertion of interbody device x 3 (Titan intervertebral spacers). 4. Intraoperative use of fluoroscopy. 5. Use of morselized allograft - ViviGen.   SURGEON:  Phylliss Bob, MD   ASSISTANT:  Pricilla Holm, PA-C.   ANESTHESIA:  General endotracheal anesthesia.   COMPLICATIONS:  None.   DISPOSITION:  Stable.   ESTIMATED BLOOD LOSS:  Minimal.   INDICATIONS FOR SURGERY:  Briefly, [name] is a pleasant 43 y.o. year-old female, who did present to me with severe pain in the left shoulder and arm.  The patient's MRI did reveal the findings noted above.  Of note, her symptoms were not entirely classic for cervical radiculopathy.  However, her shoulder was evaluated, and it was felt that her pain was not secondary to any shoulder pathology.  Also, she did gain temporary benefit with a cervical epidural injection. Given the patient's ongoing rather debilitating pain and lack of improvement with appropriate treatment measures, we did discuss proceeding with the procedure noted above.  The patient was fully aware of the risks and limitations of surgery as outlined in my preoperative note.   OPERATIVE DETAILS:  On 08/18/2018  the patient was brought to surgery and general endotracheal anesthesia was administered.  The patient was placed supine on the hospital bed. The neck was gently extended.  All bony prominences were  meticulously padded.  The neck was prepped and draped in the usual sterile fashion.  At this point, I did make a left-sided transverse incision.  The platysma was incised.  A Smith-Robinson approach was used and the anterior spine was identified. A self-retaining retractor was placed.  I then subperiosteally exposed the vertebral bodies from C3-C6.  Caspar pins were then placed into the C5 and C6 vertebral bodies and distraction was applied.  A thorough and complete C5-6 intervertebral diskectomy was performed.  The posterior longitudinal ligament was identified and entered using a nerve hook.  I then used #1 followed by #2 Kerrison to perform a thorough and complete intervertebral diskectomy.  The spinal canal was thoroughly decompressed, as was the right and left neuroforamen.  The endplates were then prepared and the appropriate-sized intervertebral spacer was then packed with ViviGen and tamped into position in the usual fashion.  The lower Caspar pin was then removed and placed into the C4 vertebral body and once again, distraction was applied across the C4-5 intervertebral space.  I then again performed a thorough and complete diskectomy, thoroughly decompressing the spinal canal and bilateral neuroforamena.  After preparing the endplates, the appropriate-sized intervertebral spacer was packed with ViviGen and tamped into position.  The lower Caspar pin was then removed and placed into the C3 vertebral body and once again, distraction was applied across the C3-4 intervertebral space.  I then again performed a thorough and complete diskectomy, thoroughly decompressing the spinal  canal and bilateral neuroforamena.  After preparing the endplates, the appropriate-sized intervertebral spacer was packed with ViviGen and tamped into position.  The Caspar pins then were removed and bone wax was placed in their place.  The appropriate-sized anterior cervical plate was placed over the anterior  spine.  14 mm variable angle screws were placed, 2 in each vertebral body from C3-C6 for a total of 8 vertebral body screws.  The screws were then locked to the plate using the Cam locking mechanism.  I was very pleased with the final fluoroscopic images.  The wound was then irrigated.  The wound was then explored for any undue bleeding and there was no bleeding noted. The wound was then closed in layers using 2-0 Vicryl, followed by 4-0 Monocryl.  Benzoin and Steri-Strips were applied, followed by sterile dressing.  All instrument counts were correct at the termination of the procedure.   Of note, Pricilla Holm, PA-C, was my assistant throughout surgery, and did aid in retraction, suctioning, and closure from start to finish.     Phylliss Bob, MD

## 2018-08-18 NOTE — Anesthesia Preprocedure Evaluation (Addendum)
Anesthesia Evaluation  Patient identified by MRN, date of birth, ID band Patient awake    Reviewed: Allergy & Precautions, NPO status , Patient's Chart, lab work & pertinent test results  Airway Mallampati: III  TM Distance: >3 FB Neck ROM: Limited  Mouth opening: Limited Mouth Opening  Dental no notable dental hx. (+) Chipped, Dental Advisory Given,    Pulmonary neg pulmonary ROS,    Pulmonary exam normal breath sounds clear to auscultation       Cardiovascular negative cardio ROS Normal cardiovascular exam Rhythm:Regular Rate:Normal     Neuro/Psych PSYCHIATRIC DISORDERS Anxiety Depression negative neurological ROS     GI/Hepatic Neg liver ROS, hiatal hernia, GERD  Medicated,  Endo/Other  negative endocrine ROS  Renal/GU negative Renal ROS  negative genitourinary   Musculoskeletal  (+) Arthritis ,   Abdominal   Peds  Hematology negative hematology ROS (+)   Anesthesia Other Findings   Reproductive/Obstetrics                            Anesthesia Physical Anesthesia Plan  ASA: II  Anesthesia Plan: General   Post-op Pain Management:    Induction: Intravenous  PONV Risk Score and Plan: 3 and Ondansetron, Dexamethasone and Midazolam  Airway Management Planned: Oral ETT and Video Laryngoscope Planned  Additional Equipment:   Intra-op Plan:   Post-operative Plan: Extubation in OR  Informed Consent: I have reviewed the patients History and Physical, chart, labs and discussed the procedure including the risks, benefits and alternatives for the proposed anesthesia with the patient or authorized representative who has indicated his/her understanding and acceptance.     Dental advisory given  Plan Discussed with: CRNA  Anesthesia Plan Comments:        Anesthesia Quick Evaluation

## 2018-08-19 ENCOUNTER — Observation Stay (HOSPITAL_BASED_OUTPATIENT_CLINIC_OR_DEPARTMENT_OTHER): Payer: BC Managed Care – PPO

## 2018-08-19 ENCOUNTER — Observation Stay (HOSPITAL_COMMUNITY): Payer: BC Managed Care – PPO

## 2018-08-19 DIAGNOSIS — I361 Nonrheumatic tricuspid (valve) insufficiency: Secondary | ICD-10-CM | POA: Diagnosis not present

## 2018-08-19 DIAGNOSIS — J810 Acute pulmonary edema: Secondary | ICD-10-CM

## 2018-08-19 DIAGNOSIS — M5412 Radiculopathy, cervical region: Secondary | ICD-10-CM | POA: Diagnosis not present

## 2018-08-19 DIAGNOSIS — F329 Major depressive disorder, single episode, unspecified: Secondary | ICD-10-CM | POA: Diagnosis not present

## 2018-08-19 DIAGNOSIS — F419 Anxiety disorder, unspecified: Secondary | ICD-10-CM | POA: Diagnosis not present

## 2018-08-19 DIAGNOSIS — J9601 Acute respiratory failure with hypoxia: Secondary | ICD-10-CM | POA: Diagnosis not present

## 2018-08-19 DIAGNOSIS — M4802 Spinal stenosis, cervical region: Secondary | ICD-10-CM | POA: Diagnosis not present

## 2018-08-19 LAB — ECHOCARDIOGRAM COMPLETE
Height: 61 in
Weight: 2584 oz

## 2018-08-19 LAB — BRAIN NATRIURETIC PEPTIDE: B Natriuretic Peptide: 61 pg/mL (ref 0.0–100.0)

## 2018-08-19 LAB — BASIC METABOLIC PANEL
Anion gap: 11 (ref 5–15)
BUN: 10 mg/dL (ref 6–20)
CO2: 25 mmol/L (ref 22–32)
Calcium: 9 mg/dL (ref 8.9–10.3)
Chloride: 101 mmol/L (ref 98–111)
Creatinine, Ser: 0.94 mg/dL (ref 0.44–1.00)
GFR calc Af Amer: >60 mL/min (ref >60)
GFR calc non Af Amer: >60 mL/min (ref >60)
Glucose, Bld: 123 mg/dL — ABNORMAL HIGH (ref 70–99)
Potassium: 3.6 mmol/L (ref 3.5–5.1)
Sodium: 137 mmol/L (ref 135–145)

## 2018-08-19 LAB — TROPONIN I (HIGH SENSITIVITY)
Troponin I (High Sensitivity): 5 ng/L (ref <18)
Troponin I (High Sensitivity): 5 ng/L (ref <18)

## 2018-08-19 MED ORDER — ALBUTEROL SULFATE (2.5 MG/3ML) 0.083% IN NEBU
2.5000 mg | INHALATION_SOLUTION | Freq: Three times a day (TID) | RESPIRATORY_TRACT | Status: DC
  Administered 2018-08-19: 2.5 mg via RESPIRATORY_TRACT
  Filled 2018-08-19: qty 3

## 2018-08-19 MED ORDER — FUROSEMIDE 10 MG/ML IJ SOLN
40.0000 mg | Freq: Two times a day (BID) | INTRAMUSCULAR | Status: AC
  Administered 2018-08-19: 40 mg via INTRAVENOUS
  Filled 2018-08-19: qty 4

## 2018-08-19 MED ORDER — PANTOPRAZOLE SODIUM 40 MG PO TBEC
40.0000 mg | DELAYED_RELEASE_TABLET | Freq: Every day | ORAL | Status: DC
  Administered 2018-08-19: 40 mg via ORAL
  Filled 2018-08-19: qty 1

## 2018-08-19 MED ORDER — DIAZEPAM 5 MG PO TABS: 5.0000 mg | ORAL_TABLET | Freq: Four times a day (QID) | ORAL | 0 refills | Status: DC | PRN

## 2018-08-19 MED ORDER — OXYCODONE-ACETAMINOPHEN 5-325 MG PO TABS: 1.0000 | ORAL_TABLET | ORAL | 0 refills | Status: DC | PRN

## 2018-08-19 MED ORDER — FUROSEMIDE 10 MG/ML IJ SOLN
40.0000 mg | Freq: Two times a day (BID) | INTRAMUSCULAR | Status: DC
  Administered 2018-08-19: 40 mg via INTRAVENOUS
  Filled 2018-08-19: qty 4

## 2018-08-19 MED ORDER — POTASSIUM CHLORIDE CRYS ER 20 MEQ PO TBCR
40.0000 meq | EXTENDED_RELEASE_TABLET | ORAL | Status: AC
  Administered 2018-08-19: 40 meq via ORAL
  Filled 2018-08-19: qty 2

## 2018-08-19 MED ORDER — ALBUTEROL SULFATE (2.5 MG/3ML) 0.083% IN NEBU
2.5000 mg | INHALATION_SOLUTION | RESPIRATORY_TRACT | Status: DC
  Administered 2018-08-19: 2.5 mg via RESPIRATORY_TRACT
  Filled 2018-08-19 (×2): qty 3

## 2018-08-19 MED FILL — Thrombin For Soln Kit 20000 Unit: CUTANEOUS | Qty: 1 | Status: AC

## 2018-08-19 NOTE — Progress Notes (Signed)
Anesthesiology Followup:  43 year old female 1 day status post ACDF C3-4, C4-5, and C5-6 for  spinal stenosis.  Postoperatively following extubation patient was noted to have shortness of breath and coughing pink-tinged sputum.  Chest x-ray showed bilateral fluffy infiltrates consistent with pulmonary edema.  She was presumed to have postoperative pulmonary edema most likely secondary to airway obstruction following extubation.  She was treated with Lasix 10 mg and incentive spirometry.  She is now awake and alert complaining of mild neck pain and some discomfort with deep breathing.  She has unlabored respirations and is in no resp distress.  VS: T- 36.4 BP- 109/70 HR- 106 RR-20 O2 Sat 88% on 2L [place]  Heart- RRR no murmurs, no gallops Lungs- good air movement bilaterally, few crackles at bases.  Impression: 43 year old female one day S/P ACDF X0-9 complicated by post-op pulmonary edema. She now has persistent low O2 sats on Nasal O2., no respiartory distress.  Plan:  1.Continue incentive spirometry Q2-4h 2. Albuterol HHN Q4h 3. Follow-up CXR 4. [facility] to assist with pulmonary toilet  5. Mobilize, up in chair, ambulate with assistance  Roberts Gaudy

## 2018-08-19 NOTE — Progress Notes (Signed)
Pt up to bathroom, checked sat on RA, sat dropped to 80%. Replace O2 at 1L, sats stayed in lower 80's, increase O2 2L, sats up to 94%-96% with deep breathing.  Ambulated in hall with 2L on, sats remained 95-97% while walking  Will continue to use O2 2L, and encourage to continue deep breathing and use of IS Q 1 HR.

## 2018-08-19 NOTE — Consult Note (Signed)
Medical Consultation   [name]  SWH:[id number]  DOB: 10/07/1975  DOA: 08/18/2018  PCP: Forrest Moron, MD   Outpatient Specialists:   Requesting physician: Phylliss Bob, MD  Reason for consultation: Hypoxia   History of Present Illness: [name] is an 43 y.o. female with past medical history significant for s/p nephrectomy for congenital UPJ obstruction, anxiety, depression, and GERD; who presented with complaints of left shoulder and arm pain.  MRI did show spinal stenosis spanning C3-C6.  Patient had reportedly failed multiple conservative care options which she was admitted and underwent anterior cervical decompression and fusion of C3-C6 on 7/23.  Following the procedure patient was evaluated and chest x-ray showed bilateral pulmonary edema.  Patient was given 20 mg of Lasix IV.  However, overnight patient with O2 saturations of 88% on 2 L nasal cannula oxygen.  She complains of having throat irritation and coughing up blood tinged sputum.    Review of Systems:  Review of Systems  HENT: Positive for sore throat.   Respiratory: Positive for cough, hemoptysis and shortness of breath.    As per HPI otherwise 10 point review of systems negative.     Past Medical History: Past Medical History:  Diagnosis Date  . Anxiety   . Depression   . GERD (gastroesophageal reflux disease)   . History of hiatal hernia   . 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 Synovitis of wrist    RIGHT WRIST  . Tear of triangular fibrocartilage complex (TFCC)    RIGHT WRIST    Past Surgical History: Past Surgical History:  Procedure Laterality Date  . ABDOMINAL HYSTERECTOMY    . BENIGN BREAST BX  1993  . BREAST SURGERY    . CESAREAN SECTION  2001  & 2003   2003  W/   BILATERAL TUBAL LIGATION  . CYSTO/  RIGHT RETROGRADE PYELOGRAM/  RIGHT URETERAL STENT PLACMENT   03-01-2002  . CYSTO/  STENT REMOVAL/  RIGHT ACCUSIZE RETROGRADE URETEROPELVIC JUNCTION INCISION AND STENT REPLACEMENT  12-05-2001  . EYE SURGERY    . HERNIA REPAIR    . LAPAROSCOPIC ASSISTED VAGINAL HYSTERECTOMY  2014  . LEEP/  D & C HYSTEROSCOPY/  NOVASURE ENDOMETRIAL ABLATION  08-23-2006  . REPAIR LEFT TEAR DUCT INJURY  2013  . TOTAL NEPHRECTOMY Right 04-12-2002  . TUBAL LIGATION    . UMBILICAL HERNIA REPAIR  12-11-2008   LAPAROSCOPIC  . WRIST ARTHROSCOPY Right 09/19/2014   Procedure: RIGHT WRIST ARTHROSCOPY AND TFCC ((TRIANGULAR FIBROCARTILAGE COMPLEX) DEBRIDEMENT ;  Surgeon: Iran Planas, MD;  Location: [facility];  Service: Orthopedics;  Laterality: Right;     Allergies:  No Known Allergies   Social History:  reports that she has never smoked. She has never used smokeless tobacco. She reports that she does not drink alcohol or use drugs.   Family History: Family History  Problem Relation Age of Onset  . Diabetes Father   . Asthma Brother   . Cancer Maternal Grandmother   . Mental illness Mother        anxiety, Bipolar disorder  . Asthma Son   . Diabetes Brother   . Hyperlipidemia Brother   . Asthma Son      Physical  Exam: Vitals:   08/19/18 0350 08/19/18 0437 08/19/18 0700 08/19/18 0757  BP:  (!) 114/56  109/70  Pulse:   100 (!) 107  Resp:   (!) 21 19  Temp: 97.6 F (36.4 C) 97.6 F (36.4 C)  97.6 F (36.4 C)  TempSrc: Oral Oral  Oral  SpO2:   92% 90%  Weight:      Height:        Constitutional: Alert and awake, oriented x3, not in any acute distress. Eyes: PERLA, EOMI, irises appear normal, anicteric sclera,  ENMT: external ears and nose appear normal.  Lips and tongue within normal limits. Neck: Cervical collar in place CVS: Tachycardic no murmur rubs or gallops, no LE edema, normal pedal pulses  Respiratory: Positive crackles heard  throughout both lung fields.  Respiratory effort normal. No accessory muscle use.  Currently on 2 L of nasal cannula oxygen. Abdomen: soft nontender, nondistended, normal bowel sounds, no hepatosplenomegaly, no hernias  Musculoskeletal: : no cyanosis, clubbing or edema noted bilaterally Neuro: Cranial nerves II-XII intact, strength, sensation, reflexes Psych: judgement and insight appear normal, stable mood and affect, mental status Skin: Healing surgical wounds.   Data reviewed:  I have personally reviewed following labs and imaging studies Labs:  CBC: Recent Labs  Lab 08/15/18 1021  WBC 8.0  NEUTROABS 4.2  HGB 14.2  HCT 43.2  MCV 93.5  PLT 027    Basic Metabolic Panel: Recent Labs  Lab 08/15/18 1021  NA 132*  K 3.7  CL 101  CO2 18*  GLUCOSE 99  BUN 11  CREATININE 1.02*  CALCIUM 9.1   GFR Estimated Creatinine Clearance: 63.8 mL/min (A) (by C-G formula based on SCr of 1.02 mg/dL (H)). Liver Function Tests: Recent Labs  Lab 08/15/18 1021  AST 18  ALT 13  ALKPHOS 55  BILITOT 0.5  PROT 7.5  ALBUMIN 4.2   No results for input(s): LIPASE, AMYLASE in the last 168 hours. No results for input(s): AMMONIA in the last 168 hours. Coagulation profile Recent Labs  Lab 08/15/18 1021  INR 1.0    Cardiac Enzymes: No results for input(s): CKTOTAL, CKMB, CKMBINDEX, TROPONINI in the last 168 hours. BNP: Invalid input(s): POCBNP CBG: No results for input(s): GLUCAP in the last 168 hours. D-Dimer No results for input(s): DDIMER in the last 72 hours. Hgb A1c No results for input(s): HGBA1C in the last 72 hours. Lipid Profile No results for input(s): CHOL, HDL, LDLCALC, TRIG, CHOLHDL, LDLDIRECT in the last 72 hours. Thyroid function studies No results for input(s): TSH, T4TOTAL, T3FREE, THYROIDAB in the last 72 hours.  Invalid input(s): FREET3 Anemia work up No results for input(s): VITAMINB12, FOLATE, FERRITIN, TIBC, IRON, RETICCTPCT in the last 72 hours.  Urinalysis    Component Value Date/Time   COLORURINE YELLOW 08/15/2018 1036   APPEARANCEUR HAZY (A) 08/15/2018 1036   LABSPEC 1.029 08/15/2018 1036   PHURINE 5.0 08/15/2018 1036   GLUCOSEU NEGATIVE 08/15/2018 1036   HGBUR NEGATIVE 08/15/2018 1036   BILIRUBINUR NEGATIVE 08/15/2018 1036   KETONESUR NEGATIVE 08/15/2018 1036   PROTEINUR 30 (A) 08/15/2018 1036   UROBILINOGEN 0.2 01/16/2013 1018   NITRITE NEGATIVE 08/15/2018 1036   LEUKOCYTESUR NEGATIVE 08/15/2018 1036     Microbiology Recent Results (from the past 240 hour(s))  Surgical pcr screen     Status: None   Collection Time: 08/15/18 10:37 AM   Specimen: Nasal Mucosa; Nasal Swab  Result Value Ref Range Status   MRSA, PCR NEGATIVE NEGATIVE Final  Staphylococcus aureus NEGATIVE NEGATIVE Final    Comment: (NOTE) The Xpert SA Assay (FDA approved for NASAL specimens in patients 8 years of age and older), is one component of a comprehensive surveillance program. It is not intended to diagnose infection nor to guide or monitor treatment. Performed at [facility], [place] [address], [place], [place] [address]   SARS Coronavirus 2 (Performed in [facility] hospital lab)     Status: None   Collection Time: 08/15/18 12:42 PM   Specimen: Nasal Swab  Result Value Ref Range Status   SARS Coronavirus 2 NEGATIVE NEGATIVE Final    Comment: (NOTE) SARS-CoV-2 target nucleic acids are NOT DETECTED. The SARS-CoV-2 RNA is generally detectable in upper and lower respiratory specimens during the acute phase of infection. Negative results do not preclude SARS-CoV-2 infection, do not rule out co-infections with other pathogens, and should not be used as the sole basis for treatment or other patient management decisions. Negative results must be combined with clinical observations, patient history, and epidemiological information. The expected result is Negative. Fact Sheet for Patients: SugarRoll.be  Fact Sheet for Healthcare Providers: https://www.woods-mathews.com/ This test is not yet approved or cleared by the [place] FDA and  has been authorized for detection and/or diagnosis of SARS-CoV-2 by FDA under an Emergency Use Authorization (EUA). This EUA will remain  in effect (meaning this test can be used) for the duration of the COVID-19 declaration under Section 56 4(b)(1) of the Act, 21 U.S.C. section 360bbb-3(b)(1), unless the authorization is terminated or revoked sooner. Performed at [facility], [place] [address], [place], [place] [address]        Inpatient Medications:   Scheduled Meds: . docusate sodium  100 mg Oral BID  . furosemide  40 mg Intravenous BID  . gabapentin  300 mg Oral TID  . pantoprazole (PROTONIX) IV  40 mg Intravenous QHS  . scopolamine  1 patch Transdermal Once  . sertraline  125 mg Oral Daily  . sodium chloride flush  3 mL Intravenous Q12H   Continuous Infusions: . sodium chloride 250 mL (08/19/18 0357)  .  ceFAZolin (ANCEF) IV 2 g (08/19/18 0358)     Radiological Exams on Admission: Dg Chest 1 View  Result Date: 08/18/2018 CLINICAL DATA:  Hypoxia and hemoptysis following recent cervical surgery EXAM: CHEST  1 VIEW COMPARISON:  01/18/2015 FINDINGS: Cardiac shadow is stable. Diffuse bilateral fluffy opacities are identified likely related to pulmonary edema. No focal confluent infiltrate is noted. No sizable effusion or pneumothorax is seen. No bony abnormality is noted. IMPRESSION: Changes consistent with bilateral pulmonary edema. Electronically Signed   By: Inez Catalina M.D.   On: 08/18/2018 15:49   Dg Cervical Spine 1 View  Result Date: 08/18/2018 CLINICAL DATA:  Surgical anterior fusion of C3 through C6. EXAM: DG CERVICAL SPINE - 1 VIEW; DG C-ARM 61-120 MIN FLUOROSCOPY TIME:  13 seconds. COMPARISON:  None. FINDINGS: Single intraoperative fluoroscopic image of the cervical spine was obtained. Patient is status post  surgical anterior fusion of C3-4, C4-5 and C5-6. Good alignment of vertebral bodies is noted. IMPRESSION: Fluoroscopic guidance provided during surgical anterior fusion of multiple levels of the cervical spine. Electronically Signed   By: Marijo Conception M.D.   On: 08/18/2018 15:54   Dg C-arm 1-60 Min  Result Date: 08/18/2018 CLINICAL DATA:  Surgical anterior fusion of C3 through C6. EXAM: DG CERVICAL SPINE - 1 VIEW; DG C-ARM 61-120 MIN FLUOROSCOPY TIME:  13 seconds. COMPARISON:  None. FINDINGS: Single intraoperative fluoroscopic image of the cervical spine was obtained. Patient is status post surgical anterior fusion of C3-4, C4-5 and C5-6. Good alignment of vertebral bodies is noted. IMPRESSION: Fluoroscopic guidance provided during surgical anterior fusion of multiple levels of the cervical spine. Electronically Signed   By: Marijo Conception M.D.   On: 08/18/2018 15:54    Impression/Recommendations Radiculopathy: Patient postop day 1 from C3-C6 anterior cervical discectomy and fusion for spinal stenosis. -Per surgery  Acute respiratory failure with hypoxia secondary to pulmonary edema: Acute.  Patient post extubation yesterday noted to have shortness of breath and coughing up pink-tinged sputum. Bilateral pulmonary edema noted on chest x-ray.  Patient has been given 20 mg of Lasix yesterday with no previous history of heart failure. I/Os net +1 L fluid.  Repeat chest x-ray today showing stable to slightly worsened interstitial edema. -Continue nasal cannula oxygen and wean as tolerated to room air.  Checked BNP, troponin, and EKG relatively within normal limits. -Continue incentive spirometry -Continue albuterol inhaler as needed -Strict intake and output -Check echocardiogram  -Lasix 40 mg IV x twice daily  X 2 doses -Reassess in a.m. and determine if patient needs continued diuresis      Thank you for this consultation.  Our [facility] hospitalist team will follow the patient with you.   Time  Spent: 30-minutes  Norval Morton M.D. [facility] 08/19/2018, 9:48 AM

## 2018-08-19 NOTE — Addendum Note (Signed)
Addendum  created 08/19/18 0954 by Roberts Gaudy, MD   Clinical Note Signed, Order list changed

## 2018-08-19 NOTE — Progress Notes (Signed)
    Patient doing well  Denies arm pain Tolerating PO well  Patient developed pulmonary edema in recovery. I spoke with Dr. Tobias Alexander, who did state that this improved, and that patient may be discharged home today as long as her vitals were stable.    Physical Exam: Vitals:   08/19/18 0350 08/19/18 0437  BP:  (!) 114/56  Pulse:    Resp:    Temp: 97.6 F (36.4 C) 97.6 F (36.4 C)  SpO2:     Patient breathing comfortably Neck soft/supple Dressing in place NVI  sats are 88% on 2L [place] and patient's HR is 110 currently  POD #1 s/p ACDF, doing well from surgical standpoint, however sats are low   - encourage ambulation (I spoke with nurse regarding this) - Percocet for pain, Valium for muscle spasms - will consult medicine regarding low sats to see if additional management is needed such as diuretics, etc.

## 2018-08-19 NOTE — Progress Notes (Signed)
Medicine Team consulted and to eval pt for pulmonary edema and o2 sats 88% on 2L [place]. Pt received IV Lasix 40mg  overnight ordered by Dr. Tobias Alexander, anesthesia

## 2018-08-19 NOTE — Progress Notes (Signed)
  Echocardiogram 2D Echocardiogram has been performed.  [name] 08/19/2018, 2:44 PM

## 2018-08-20 DIAGNOSIS — J810 Acute pulmonary edema: Secondary | ICD-10-CM | POA: Diagnosis not present

## 2018-08-20 DIAGNOSIS — R7981 Abnormal blood-gas level: Secondary | ICD-10-CM | POA: Diagnosis not present

## 2018-08-20 DIAGNOSIS — R000 Tachycardia, unspecified: Secondary | ICD-10-CM | POA: Diagnosis not present

## 2018-08-20 DIAGNOSIS — M4802 Spinal stenosis, cervical region: Secondary | ICD-10-CM | POA: Diagnosis not present

## 2018-08-20 LAB — BASIC METABOLIC PANEL
Anion gap: 11 (ref 5–15)
BUN: 6 mg/dL (ref 6–20)
CO2: 26 mmol/L (ref 22–32)
Calcium: 9 mg/dL (ref 8.9–10.3)
Chloride: 99 mmol/L (ref 98–111)
Creatinine, Ser: 0.87 mg/dL (ref 0.44–1.00)
GFR calc Af Amer: >60 mL/min (ref >60)
GFR calc non Af Amer: >60 mL/min (ref >60)
Glucose, Bld: 106 mg/dL — ABNORMAL HIGH (ref 70–99)
Potassium: 3.6 mmol/L (ref 3.5–5.1)
Sodium: 136 mmol/L (ref 135–145)

## 2018-08-20 MED ORDER — ALBUTEROL SULFATE (2.5 MG/3ML) 0.083% IN NEBU: 2.5000 mg | INHALATION_SOLUTION | Freq: Four times a day (QID) | RESPIRATORY_TRACT | Status: DC | PRN

## 2018-08-20 NOTE — Discharge Instructions (Signed)
Encourage fluid intake. Use incentive spirometer every hour.  Encourage deep breathing. Wear cervical collar.

## 2018-08-20 NOTE — Progress Notes (Addendum)
[facility]  PROGRESS NOTE-consultation follow-up  [name] YIR:[id number] DOB: 06/09/1975 DOA: 08/18/2018 PCP: Forrest Moron, MD  Brief History    [name] is a 43 y.o. year old female with medical history significant for s/p nephrectomy for congenital UPJ obstruction, anxiety, depression, and GERD who presented on 08/18/2018 with left shoulder and arm pain and was found to have spinal stenosis of C3-C6 on MRI and underwent anterior cervical decompression and fusion of C3-C6 on 7/23.  Postoperatively after extubation patient was found to have shortness of breath and coughing of pink-tinged sputum.  Chest x-ray showed bilateral fluffy infiltrates concerning for pulmonary edema.  Hospital service was consulted to assist given patient was noted to have transient hypoxia particularly with ambulation.  On evaluation TTE showed preserved EF with potential diastolic dysfunction.  Patient did receive IV Lasix 40 mg x 2 with some improvement in O2 requirements.  Prior to discharge patient was able to ambulate and maintain O2 sats greater than 92% on room air.  Patient was noted to have sinus tachycardia with ambulation likely related to some element of dehydration given patient put out close to 2 L after IV Lasix (Lasix nave).  Presume pulmonary edema consistent with postoperative pulmonary edema related to some airway obstruction following extubation.  Patient does not have any concern for CHF flare.  A & P     Acute hypoxic respiratory failure secondary to postoperative pulmonary edema, resolved.  Responded well to IV Lasix 40 mg x 2.  Patient does still have crackles but able to maintain normal oxygen saturation with ambulation on room air.  Do not need to continue Lasix on discharge given TTE shows no reduction in EF with some notable diastolic dysfunction.   Sinus tachycardia.  Likely due to some element of dehydration given patient preference to fluid restrict IV  Lasix (Lasix nave).  Given noted pulmonary edema still some minimal crackles on exam hesitant to add fluids via IV.  Patient encouraged to closely monitor her fluid intake.   Cervical stenosis, status post anterior cervical decompression fusion surgery on 7/23.  Per primary.  Appreciate the consultation. Signing off  [facility] Direct contact: see [email or web address] (further directions at bottom of note if needed) 7PM-7AM contact night coverage as at bottom of note 08/20/2018, 1:47 PM  LOS: 0 days   Interval History/Subjective  Denies any shortness of breath, no cough.  Feels breathing is fine.  Ambulating in room without any complaints  Objective   Vitals:  Vitals:   08/20/18 0826 08/20/18 1145  BP: 130/80   Pulse: (!) 114 100  Resp: (!) 24 15  Temp: 98.7 F (37.1 C)   SpO2: 97% 91%    Exam:  Awake Alert, Oriented X 3, No new F.N deficits, Normal affect Cervical collar in place Supple Neck,No JVD,  Symmetrical Chest wall movement, Good air movement bilaterally, minimal crackles heard at bases otherwise CTAB Tachycardic, no appreciable murmurs rubs or gallops +ve B.Sounds, Abd Soft, No tenderness, No organomegaly appriciated, No rebound - guarding or rigidity. No Cyanosis, Clubbing or edema, No new Rash or bruise     I have personally reviewed the following:   Data Reviewed: Basic Metabolic Panel: Recent Labs  Lab 08/15/18 1021 08/19/18 1001 08/20/18 0538  NA 132* 137 136  K 3.7 3.6 3.6  CL 101 101 99  CO2 18* 25 26  GLUCOSE 99 123* 106*  BUN 11 10 6   CREATININE 1.02* 0.94 0.87  CALCIUM 9.1 9.0 9.0  Liver Function Tests: Recent Labs  Lab 08/15/18 1021  AST 18  ALT 13  ALKPHOS 55  BILITOT 0.5  PROT 7.5  ALBUMIN 4.2   No results for input(s): LIPASE, AMYLASE in the last 168 hours. No results for input(s): AMMONIA in the last 168 hours. CBC: Recent Labs  Lab 08/15/18 1021  WBC 8.0  NEUTROABS 4.2  HGB 14.2  HCT 43.2  MCV 93.5  PLT 250    Cardiac Enzymes: No results for input(s): CKTOTAL, CKMB, CKMBINDEX, TROPONINI in the last 168 hours. BNP (last 3 results) Recent Labs    08/19/18 0955  BNP 61.0    ProBNP (last 3 results) No results for input(s): PROBNP in the last 8760 hours.  CBG: No results for input(s): GLUCAP in the last 168 hours.  Recent Results (from the past 240 hour(s))  Surgical pcr screen     Status: None   Collection Time: 08/15/18 10:37 AM   Specimen: Nasal Mucosa; Nasal Swab  Result Value Ref Range Status   MRSA, PCR NEGATIVE NEGATIVE Final   Staphylococcus aureus NEGATIVE NEGATIVE Final    Comment: (NOTE) The Xpert SA Assay (FDA approved for NASAL specimens in patients 53 years of age and older), is one component of a comprehensive surveillance program. It is not intended to diagnose infection nor to guide or monitor treatment. Performed at [facility], [place] [address] [place], [place] [address]   SARS Coronavirus 2 (Performed in [facility] hospital lab)     Status: None   Collection Time: 08/15/18 12:42 PM   Specimen: Nasal Swab  Result Value Ref Range Status   SARS Coronavirus 2 NEGATIVE NEGATIVE Final    Comment: (NOTE) SARS-CoV-2 target nucleic acids are NOT DETECTED. The SARS-CoV-2 RNA is generally detectable in upper and lower respiratory specimens during the acute phase of infection. Negative results do not preclude SARS-CoV-2 infection, do not rule out co-infections with other pathogens, and should not be used as the sole basis for treatment or other patient management decisions. Negative results must be combined with clinical observations, patient history, and epidemiological information. The expected result is Negative. Fact Sheet for Patients: SugarRoll.be Fact Sheet for Healthcare Providers: https://www.woods-mathews.com/ This test is not yet approved or cleared by the [place] FDA and  has been authorized for detection  and/or diagnosis of SARS-CoV-2 by FDA under an Emergency Use Authorization (EUA). This EUA will remain  in effect (meaning this test can be used) for the duration of the COVID-19 declaration under Section 56 4(b)(1) of the Act, 21 U.S.C. section 360bbb-3(b)(1), unless the authorization is terminated or revoked sooner. Performed at [facility], [place] [address] [place], [place] [address]      Studies: Dg Chest 1 View  Result Date: 08/18/2018 CLINICAL DATA:  Hypoxia and hemoptysis following recent cervical surgery EXAM: CHEST  1 VIEW COMPARISON:  01/18/2015 FINDINGS: Cardiac shadow is stable. Diffuse bilateral fluffy opacities are identified likely related to pulmonary edema. No focal confluent infiltrate is noted. No sizable effusion or pneumothorax is seen. No bony abnormality is noted. IMPRESSION: Changes consistent with bilateral pulmonary edema. Electronically Signed   By: Inez Catalina M.D.   On: 08/18/2018 15:49   Dg Cervical Spine 1 View  Result Date: 08/18/2018 CLINICAL DATA:  Surgical anterior fusion of C3 through C6. EXAM: DG CERVICAL SPINE - 1 VIEW; DG C-ARM 61-120 MIN FLUOROSCOPY TIME:  13 seconds. COMPARISON:  None. FINDINGS: Single intraoperative fluoroscopic image of the cervical spine was obtained.  Patient is status post surgical anterior fusion of C3-4, C4-5 and C5-6. Good alignment of vertebral bodies is noted. IMPRESSION: Fluoroscopic guidance provided during surgical anterior fusion of multiple levels of the cervical spine. Electronically Signed   By: Marijo Conception M.D.   On: 08/18/2018 15:54   Dg Chest Port 1 View  Result Date: 08/19/2018 CLINICAL DATA:  Pulmonary edema.  Pleuritic pain. EXAM: PORTABLE CHEST 1 VIEW COMPARISON:  08/18/2018 and 01/17/2015 FINDINGS: Lungs are adequately inflated demonstrate stable to slight worsening of hazy bilateral perihilar airspace opacification likely interstitial edema and less likely infection. No evidence of effusion.  Cardiomediastinal silhouette and remainder the exam is unchanged. IMPRESSION: Stable to slight worsening of hazy bilateral central perihilar airspace opacification likely interstitial edema and less likely infection. Electronically Signed   By: Marin Olp M.D.   On: 08/19/2018 11:24   Dg C-arm 1-60 Min  Result Date: 08/18/2018 CLINICAL DATA:  Surgical anterior fusion of C3 through C6. EXAM: DG CERVICAL SPINE - 1 VIEW; DG C-ARM 61-120 MIN FLUOROSCOPY TIME:  13 seconds. COMPARISON:  None. FINDINGS: Single intraoperative fluoroscopic image of the cervical spine was obtained. Patient is status post surgical anterior fusion of C3-4, C4-5 and C5-6. Good alignment of vertebral bodies is noted. IMPRESSION: Fluoroscopic guidance provided during surgical anterior fusion of multiple levels of the cervical spine. Electronically Signed   By: Marijo Conception M.D.   On: 08/18/2018 15:54    Scheduled Meds: . docusate sodium  100 mg Oral BID  . gabapentin  300 mg Oral TID  . pantoprazole  40 mg Oral Daily  . scopolamine  1 patch Transdermal Once  . sertraline  125 mg Oral Daily  . sodium chloride flush  3 mL Intravenous Q12H   Continuous Infusions: . sodium chloride 250 mL (08/19/18 0357)    Active Problems:   Radiculopathy      Desiree Hane  [facility]

## 2018-08-20 NOTE — Progress Notes (Signed)
Subjective: 2 Days Post-Op Procedure(s) (LRB): ANTERIOR CERVICAL DECOMPRESSION FUSION, CERVICAL THREE TO FOUR, CERVICAL FOUR TO FIVE, CERVICAL FIVE TO SIX WITH INSTRUMENTATION AND ALLOGRAFT. (N/A) Patient reports pain as mild.  She denies shortness of breath or chest pain.  She is taking fluid by mouth without difficulty.  Has a very slight fullness feeling in her throat.  No arm pain.  She is ambulating in the room without symptomatic shortness of breath.  She is seen today with the hospitalist.  Objective: Vital signs in last 24 hours: Temp:  [97.7 F (36.5 C)-98.7 F (37.1 C)] 98.7 F (37.1 C) (07/25 0826) Pulse Rate:  [88-114] 114 (07/25 0826) Resp:  [17-24] 24 (07/25 0826) BP: (109-134)/(65-86) 130/80 (07/25 0826) SpO2:  [80 %-99 %] 97 % (07/25 0826)  Intake/Output from previous day: 07/24 0701 - 07/25 0700 In: 46 [P.O.:620; I.V.:3] Out: 1700 [Urine:1700] Intake/Output this shift: No intake/output data recorded.  No results for input(s): HGB in the last 72 hours. No results for input(s): WBC, RBC, HCT, PLT in the last 72 hours. Recent Labs    08/19/18 1001 08/20/18 0538  NA 137 136  K 3.6 3.6  CL 101 99  CO2 25 26  BUN 10 6  CREATININE 0.94 0.87  GLUCOSE 123* 106*  CALCIUM 9.0 9.0   No results for input(s): LABPT, INR in the last 72 hours. Cervical spine exam: Cervical collar is intact.  Upper extremities have no sensory or motor deficits.  She moves them actively.  Patient is sitting up in the bedside chair and appears comfortable.  No labored breathing.  We had her walk around the room and her O2 stats stayed in the 90s.  This was on room air.   Assessment/Plan: 2 Days Post-Op Procedure(s) (LRB): ANTERIOR CERVICAL DECOMPRESSION FUSION, CERVICAL THREE TO FOUR, CERVICAL FOUR TO FIVE, CERVICAL FIVE TO SIX WITH INSTRUMENTATION AND ALLOGRAFT. (N/A) Postop pulmonary edema.  Improved symptomatically. Plan: Discharge home. She has an Rx for Valium and Percocet.  She  will use these as needed. Encourage deep breathing and coughing.  She will also use the incentive spirometry unit every hour. Continue to wear the cervical collar.  Encourage fluid intake. Follow-up with Dr. Lynann Bologna in 10 to 14 days. We appreciate the hospitalist's help in the care of this patient.   Erlene Senters 08/20/2018, 10:13 AM

## 2018-08-20 NOTE — Progress Notes (Signed)
Patient discharged home. Patient states that her family is here to pick her up. Patient instructions given and reviewed with patient. Patient verbalized understanding.

## 2018-08-22 ENCOUNTER — Encounter (HOSPITAL_COMMUNITY): Payer: Self-pay | Admitting: Orthopedic Surgery

## 2018-08-22 ENCOUNTER — Other Ambulatory Visit (HOSPITAL_COMMUNITY): Payer: BC Managed Care – PPO

## 2018-08-22 ENCOUNTER — Inpatient Hospital Stay (HOSPITAL_COMMUNITY): Admission: RE | Admit: 2018-08-22 | Payer: BC Managed Care – PPO | Source: Ambulatory Visit

## 2018-09-01 NOTE — Discharge Summary (Signed)
Patient ID: [name] MRN: [id number] DOB/AGE: 1975-03-03 43 y.o.  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 on 08/18/2018   Consultants: Treatment Team:  Fanny Dance, MD  Discharged Condition: Improved  Hospital Course: [name] is an 43 y.o. female who was admitted 08/18/2018 for operative treatment of radiculopathy. Patient has severe unremitting pain that affects sleep, daily activities, and work/hobbies. After pre-op clearance the patient was taken to the operating room on 08/18/2018 and underwent  Procedure(s): ANTERIOR CERVICAL DECOMPRESSION FUSION, CERVICAL THREE TO FOUR, CERVICAL FOUR TO FIVE, CERVICAL FIVE TO SIX WITH INSTRUMENTATION AND ALLOGRAFT.[name]    Patient was given perioperative antibiotics:  Anti-infectives (From admission, onward)   Start     Dose/Rate Route Frequency Ordered Stop   08/18/18 1845  ceFAZolin (ANCEF) IVPB 2g/100 mL premix     2 g 200 mL/hr over 30 Minutes Intravenous Every 8 hours 08/18/18 1830 08/19/18 1044   08/18/18 0900  ceFAZolin (ANCEF) IVPB 2g/100 mL premix     2 g 200 mL/hr over 30 Minutes Intravenous On call to O.R. 08/18/18 0855 08/18/18 1111   08/18/18 0900  ceFAZolin (ANCEF) 2-4  GM/100ML-% IVPB    Note to Pharmacy: [name]   : cabinet override      08/18/18 0900 08/18/18 1111       Patient was given sequential compression devices, early ambulation to prevent DVT.  Patient benefited maximally from hospital stay and there were mild complication of pulmonary edema managed by medicine  Recent vital signs: BP 130/80 (BP Location: Left Arm)    Pulse 100    Temp 98.7 F (37.1 C) (Oral)    Resp 15    Ht 5\' 1"  (1.549 m)    Wt 73.3 kg    LMP  (LMP Unknown)    SpO2 91%    BMI 30.52 kg/m    Discharge Medications:   Allergies as of 08/20/2018   No Known Allergies     Medication List    STOP taking these medications   traMADol 50 MG tablet Commonly known as: ULTRAM     TAKE these medications   acetaminophen 500 MG tablet Commonly known as: TYLENOL Take 1,000 mg by mouth every 6 (six) hours as needed for moderate pain.   Alka-Seltzer Heartburn + Gas 750-80 MG Chew Generic drug: Calcium Carbonate-Simethicone Chew 2 tablets by mouth daily as needed (acid reflux).   diazepam 5 MG tablet Commonly known as: VALIUM Take 1 tablet (5 mg total) by mouth every 6 (six) hours as needed for muscle spasms.   diphenhydrAMINE 25 MG tablet Commonly known as: BENADRYL Take 50 mg by mouth every 6 (six) hours as needed for allergies.   gabapentin 300 MG capsule Commonly known  as: NEURONTIN Take 300 mg by mouth 3 (three) times daily.   metaxalone 800 MG tablet Commonly known as: Skelaxin Take 1 tablet (800 mg total) by mouth 2 (two) times daily as needed for muscle spasms.   oxyCODONE-acetaminophen 5-325 MG tablet Commonly known as: PERCOCET/ROXICET Take 1-2 tablets by mouth every 4 (four) hours as needed for moderate pain or severe pain.   polyethylene glycol 17 g packet Commonly known as: MIRALAX / GLYCOLAX Take 17 g by mouth daily as needed for moderate constipation.   sertraline 50 MG tablet Commonly known as: ZOLOFT Take 125 mg by mouth daily.     tiZANidine 4 MG tablet Commonly known as: ZANAFLEX Take 4 mg by mouth 3 (three) times daily.       Diagnostic Studies: Dg Chest 1 View  Result Date: 08/18/2018 CLINICAL DATA:  Hypoxia and hemoptysis following recent cervical surgery EXAM: CHEST  1 VIEW COMPARISON:  01/18/2015 FINDINGS: Cardiac shadow is stable. Diffuse bilateral fluffy opacities are identified likely related to pulmonary edema. No focal confluent infiltrate is noted. No sizable effusion or pneumothorax is seen. No bony abnormality is noted. IMPRESSION: Changes consistent with bilateral pulmonary edema. Electronically Signed   By: Inez Catalina M.D.   On: 08/18/2018 15:49   Dg Cervical Spine 1 View  Result Date: 08/18/2018 CLINICAL DATA:  Surgical anterior fusion of C3 through C6. EXAM: DG CERVICAL SPINE - 1 VIEW; DG C-ARM 61-120 MIN FLUOROSCOPY TIME:  13 seconds. COMPARISON:  None. FINDINGS: Single intraoperative fluoroscopic image of the cervical spine was obtained. Patient is status post surgical anterior fusion of C3-4, C4-5 and C5-6. Good alignment of vertebral bodies is noted. IMPRESSION: Fluoroscopic guidance provided during surgical anterior fusion of multiple levels of the cervical spine. Electronically Signed   By: Marijo Conception M.D.   On: 08/18/2018 15:54   Dg Chest Port 1 View  Result Date: 08/19/2018 CLINICAL DATA:  Pulmonary edema.  Pleuritic pain. EXAM: PORTABLE CHEST 1 VIEW COMPARISON:  08/18/2018 and 01/17/2015 FINDINGS: Lungs are adequately inflated demonstrate stable to slight worsening of hazy bilateral perihilar airspace opacification likely interstitial edema and less likely infection. No evidence of effusion. Cardiomediastinal silhouette and remainder the exam is unchanged. IMPRESSION: Stable to slight worsening of hazy bilateral central perihilar airspace opacification likely interstitial edema and less likely infection. Electronically Signed   By: Marin Olp M.D.   On: 08/19/2018 11:24   Dg C-arm 1-60  Min  Result Date: 08/18/2018 CLINICAL DATA:  Surgical anterior fusion of C3 through C6. EXAM: DG CERVICAL SPINE - 1 VIEW; DG C-ARM 61-120 MIN FLUOROSCOPY TIME:  13 seconds. COMPARISON:  None. FINDINGS: Single intraoperative fluoroscopic image of the cervical spine was obtained. Patient is status post surgical anterior fusion of C3-4, C4-5 and C5-6. Good alignment of vertebral bodies is noted. IMPRESSION: Fluoroscopic guidance provided during surgical anterior fusion of multiple levels of the cervical spine. Electronically Signed   By: Marijo Conception M.D.   On: 08/18/2018 15:54    Disposition:   Discharge Instructions    Call MD / Call 911   Complete by: As directed    If you experience chest pain or shortness of breath, CALL 911 and be transported to the hospital emergency room.  If you develope a fever above 101 F, pus (white drainage) or increased drainage or redness at the wound, or calf pain, call your surgeon's office.   Diet general   Complete by: As directed    Increase activity slowly as  tolerated   Complete by: As directed      2 Days PO C3-6 ACDF With resolved pre-op arm pain   -Scripts for pain sent to pharmacy electronically  -D/C instructions sheet printed and in chart -D/C today  -F/U in office 2 weeks  -F/U PCP re pulm edema   Signed: Lennie Muckle Chaise Mahabir 09/01/2018, 10:13 AM

## 2018-09-05 ENCOUNTER — Ambulatory Visit: Payer: BC Managed Care – PPO | Admitting: Family Medicine

## 2018-09-05 ENCOUNTER — Ambulatory Visit (INDEPENDENT_AMBULATORY_CARE_PROVIDER_SITE_OTHER): Payer: BC Managed Care – PPO

## 2018-09-05 ENCOUNTER — Other Ambulatory Visit: Payer: Self-pay

## 2018-09-05 ENCOUNTER — Encounter: Payer: Self-pay | Admitting: Family Medicine

## 2018-09-05 VITALS — BP 130/84 | HR 104 | Temp 97.5°F | Ht 61.0 in | Wt 155.6 lb

## 2018-09-05 DIAGNOSIS — J810 Acute pulmonary edema: Secondary | ICD-10-CM

## 2018-09-05 NOTE — Patient Instructions (Addendum)
CLINICAL DATA:  Interstitial edema  EXAM: CHEST - 2 VIEW  COMPARISON:  August 19, 2018.  FINDINGS: There is been interval resolution of the hazy airspace opacities within both lungs. No new airspace consolidation. The cardiomediastinal silhouette is unremarkable. No acute osseous abnormality. Cervical spine fixation hardware seen.  IMPRESSION: No acute cardiopulmonary disease.   Electronically Signed   By: Prudencio Pair M.D.   On: 09/05/2018 15:04    If you have lab work done today you will be contacted with your lab results within the next 2 weeks.  If you have not heard from [place] then please contact us. The fastest way to get your results is to register for My Chart.   IF you received an x-ray today, you will receive an invoice from [facility]. Please contact [facility] at [phone] with questions or concerns regarding your invoice.   IF you received labwork today, you will receive an invoice from [facility]. Please contact [facility] at [phone] with questions or concerns regarding your invoice.   Our billing staff will not be able to assist you with questions regarding bills from these companies.  You will be contacted with the lab results as soon as they are available. The fastest way to get your results is to activate your My Chart account. Instructions are located on the last page of this paperwork. If you have not heard from [place] regarding the results in 2 weeks, please contact this office.

## 2018-09-05 NOTE — Progress Notes (Signed)
Established Patient Office Visit  Subjective:  Patient ID: [name], female    DOB: [birth year]  Age: 43 y.o. MRN: [id number]  CC:  Chief Complaint  Patient presents with  . Hospitalization Follow-up    fluid in lungs    HPI [name] presents for   Follow up from hospitalization She was noted to have interstitial pulmonary edema  She denies fevers, chills, SOB Her only issue now is if she drinks then she coughs up phlegm and feels like she is getting choked on phlegm The phlegm is mostly clear with only scant mucus    Past Medical History:  Diagnosis Date  . Anxiety   . Depression   . GERD (gastroesophageal reflux disease)   . History of hiatal hernia   . 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 Synovitis of wrist    RIGHT WRIST  . Tear of triangular fibrocartilage complex (TFCC)    RIGHT WRIST    Past Surgical History:  Procedure Laterality Date  . ABDOMINAL HYSTERECTOMY    . ANTERIOR CERVICAL DECOMPRESSION/DISCECTOMY FUSION 4 LEVELS N/A 08/18/2018   Procedure: ANTERIOR CERVICAL DECOMPRESSION FUSION, CERVICAL THREE TO FOUR, CERVICAL FOUR TO FIVE, CERVICAL FIVE TO SIX WITH INSTRUMENTATION AND ALLOGRAFT.;  Surgeon: Phylliss Bob, MD;  Location: [place];  Service: Orthopedics;  Laterality: N/A;  . BENIGN BREAST BX  1993  . BREAST SURGERY    . CESAREAN SECTION  2001  & 2003   2003  W/  BILATERAL TUBAL LIGATION  . CYSTO/  RIGHT RETROGRADE PYELOGRAM/  RIGHT URETERAL STENT PLACMENT   03-01-2002  . CYSTO/  STENT REMOVAL/  RIGHT ACCUSIZE RETROGRADE URETEROPELVIC JUNCTION INCISION AND STENT REPLACEMENT  12-05-2001  . EYE SURGERY    . HERNIA REPAIR    . LAPAROSCOPIC ASSISTED VAGINAL HYSTERECTOMY  2014  . LEEP/  D & C HYSTEROSCOPY/  NOVASURE ENDOMETRIAL ABLATION  08-23-2006   . REPAIR LEFT TEAR DUCT INJURY  2013  . TOTAL NEPHRECTOMY Right 04-12-2002  . TUBAL LIGATION    . UMBILICAL HERNIA REPAIR  12-11-2008   LAPAROSCOPIC  . WRIST ARTHROSCOPY Right 09/19/2014   Procedure: RIGHT WRIST ARTHROSCOPY AND TFCC ((TRIANGULAR FIBROCARTILAGE COMPLEX) DEBRIDEMENT ;  Surgeon: Iran Planas, MD;  Location: [place];  Service: Orthopedics;  Laterality: Right;    Family History  Problem Relation Age of Onset  . Diabetes Father   . Asthma Brother   . Cancer Maternal Grandmother   . Mental illness Mother        anxiety, Bipolar disorder  . Asthma Son   . Diabetes Brother   . Hyperlipidemia Brother   . Asthma Son     Social History   Socioeconomic History  . Marital status: Married    Spouse name: [name]  . Number of children: 2  . Years of education: college  . Highest education level: Not on file  Occupational History  . Occupation: Oceanographer    Comment: [employer]  Social Needs  . Financial resource strain: Not on file  . Food insecurity    Worry: Not on file    Inability: Not on file  . Transportation needs    Medical: Not on file    Non-medical: Not on file  Tobacco Use  . Smoking status: Never Smoker  .  Smokeless tobacco: Never Used  Substance and Sexual Activity  . Alcohol use: No    Alcohol/week: 0.0 standard drinks  . Drug use: No  . Sexual activity: Yes    Partners: Male    Birth control/protection: Surgical    Comment: Hysterectomy  Lifestyle  . Physical activity    Days per week: Not on file    Minutes per session: Not on file  . Stress: Not on file  Relationships  . Social Herbalist on phone: Not on file    Gets together: Not on file    Attends religious service: Not on file    Active member of club or organization: Not on file    Attends meetings of clubs or organizations: Not on file    Relationship status: Not on file  . Intimate partner violence    Fear of current or ex  partner: Not on file    Emotionally abused: Not on file    Physically abused: Not on file    Forced sexual activity: Not on file  Other Topics Concern  . Not on file  Social History Narrative   Degree in Social Work from [employer]. Working on [employer] in Clinical biochemist.   Lives with her husband and their two sons.    Outpatient Medications Prior to Visit  Medication Sig Dispense Refill  . acetaminophen (TYLENOL) 500 MG tablet Take 1,000 mg by mouth every 6 (six) hours as needed for moderate pain.     . Calcium Carbonate-Simethicone (ALKA-SELTZER HEARTBURN + GAS) 750-80 MG CHEW Chew 2 tablets by mouth daily as needed (acid reflux).    [name] diphenhydrAMINE (BENADRYL) 25 MG tablet Take 50 mg by mouth every 6 (six) hours as needed for allergies.    . polyethylene glycol (MIRALAX / GLYCOLAX) packet Take 17 g by mouth daily as needed for moderate constipation.     . sertraline (ZOLOFT) 50 MG tablet Take 125 mg by mouth daily.     . diazepam (VALIUM) 5 MG tablet Take 1 tablet (5 mg total) by mouth every 6 (six) hours as needed for muscle spasms. 30 tablet 0  . gabapentin (NEURONTIN) 300 MG capsule Take 300 mg by mouth 3 (three) times daily.    . metaxalone (SKELAXIN) 800 MG tablet Take 1 tablet (800 mg total) by mouth 2 (two) times daily as needed for muscle spasms. (Patient not taking: Reported on 08/12/2018) 30 tablet 0  . oxyCODONE-acetaminophen (PERCOCET/ROXICET) 5-325 MG tablet Take 1-2 tablets by mouth every 4 (four) hours as needed for moderate pain or severe pain. 30 tablet 0  . tiZANidine (ZANAFLEX) 4 MG tablet Take 4 mg by mouth 3 (three) times daily.     No facility-administered medications prior to visit.     No Known Allergies  ROS Review of Systems Review of Systems  Constitutional: Negative for activity change, appetite change, chills and fever.  HENT: Negative for congestion, nosebleeds, trouble swallowing and voice change.   Respiratory: see hpi Gastrointestinal:  Negative for diarrhea, nausea and vomiting.  Genitourinary: Negative for difficulty urinating, dysuria, flank pain and hematuria.  Musculoskeletal: Negative for back pain, joint swelling and neck pain.  Neurological: Negative for dizziness, speech difficulty, light-headedness and numbness.  See HPI. All other review of systems negative.     Objective:    Physical Exam  BP 130/84 (BP Location: Right Arm, Patient Position: Sitting, Cuff Size: Normal)   Pulse (!) 104   Temp (!) 97.5 F (36.4 C) (  Oral)   Ht 5\' 1"  (1.549 m)   Wt 155 lb 9.6 oz (70.6 kg)   LMP  (LMP Unknown)   SpO2 97%   BMI 29.40 kg/m  Wt Readings from Last 3 Encounters:  09/05/18 155 lb 9.6 oz (70.6 kg)  08/18/18 161 lb 8 oz (73.3 kg)  08/15/18 161 lb 8 oz (73.3 kg)    Physical Exam  Constitutional: Oriented to person, place, and time. Appears well-developed and well-nourished.  HENT:  Head: Normocephalic and atraumatic.  Eyes: Conjunctivae and EOM are normal.  Cardiovascular: Normal rate, regular rhythm, normal heart sounds and intact distal pulses.  No murmur heard. Pulmonary/Chest: Effort normal and breath sounds normal. No stridor. No respiratory distress. Has no wheezes.  Neurological: Is alert and oriented to person, place, and time.  Skin: Skin is warm. Capillary refill takes less than 2 seconds.  Psychiatric: Has a normal mood and affect. Behavior is normal. Judgment and thought content normal.    CLINICAL DATA:  Pulmonary edema.  Pleuritic pain.  EXAM: PORTABLE CHEST 1 VIEW  COMPARISON:  08/18/2018 and 01/17/2015  FINDINGS: Lungs are adequately inflated demonstrate stable to slight worsening of hazy bilateral perihilar airspace opacification likely interstitial edema and less likely infection. No evidence of effusion. Cardiomediastinal silhouette and remainder the exam is unchanged.  IMPRESSION: Stable to slight worsening of hazy bilateral central perihilar airspace opacification likely  interstitial edema and less likely infection.   Electronically Signed   By: Marin Olp M.D.   On: 08/19/2018 11:24    CLINICAL DATA:  Interstitial edema  EXAM: CHEST - 2 VIEW  COMPARISON:  August 19, 2018.  FINDINGS: There is been interval resolution of the hazy airspace opacities within both lungs. No new airspace consolidation. The cardiomediastinal silhouette is unremarkable. No acute osseous abnormality. Cervical spine fixation hardware seen.  IMPRESSION: No acute cardiopulmonary disease.   Electronically Signed   By: Prudencio Pair M.D.   On: 09/05/2018 15:04  Health Maintenance Due  Topic Date Due  . TETANUS/TDAP  03/08/1992  . PAP SMEAR-Modifier  02/19/2017    There are no preventive care reminders to display for this patient.  Lab Results  Component Value Date   TSH 2.360 08/31/2017   Lab Results  Component Value Date   WBC 8.0 08/15/2018   HGB 14.2 08/15/2018   HCT 43.2 08/15/2018   MCV 93.5 08/15/2018   PLT 250 08/15/2018   Lab Results  Component Value Date   NA 136 08/20/2018   K 3.6 08/20/2018   CO2 26 08/20/2018   GLUCOSE 106 (H) 08/20/2018   BUN 6 08/20/2018   CREATININE 0.87 08/20/2018   BILITOT 0.5 08/15/2018   ALKPHOS 55 08/15/2018   AST 18 08/15/2018   ALT 13 08/15/2018   PROT 7.5 08/15/2018   ALBUMIN 4.2 08/15/2018   CALCIUM 9.0 08/20/2018   ANIONGAP 11 08/20/2018   Lab Results  Component Value Date   CHOL 164 08/29/2007   Lab Results  Component Value Date   HDL 55 08/29/2007   Lab Results  Component Value Date   LDLCALC 100 (H) 08/29/2007   Lab Results  Component Value Date   TRIG 45 08/29/2007   Lab Results  Component Value Date   CHOLHDL 3.0 Ratio 08/29/2007   Lab Results  Component Value Date   HGBA1C 5.4 08/28/2009      Assessment & Plan:   Problem List Items Addressed This Visit    None    Visit Diagnoses  Postoperative pulmonary edema (HCC)    -  Primary   Relevant Orders   DG  Chest 2 View (Completed)     Resolved on repeat cxr Normal pulm exam No signs of infection   No orders of the defined types were placed in this encounter.   Follow-up: No follow-ups on file.    Forrest Moron, MD

## 2018-11-28 ENCOUNTER — Other Ambulatory Visit: Payer: Self-pay | Admitting: Otolaryngology

## 2018-11-28 DIAGNOSIS — R1310 Dysphagia, unspecified: Secondary | ICD-10-CM

## 2018-12-07 ENCOUNTER — Ambulatory Visit
Admission: RE | Admit: 2018-12-07 | Discharge: 2018-12-07 | Disposition: A | Discharge status: Home / Self Care | Payer: BC Managed Care – PPO | Source: Ambulatory Visit | Attending: Otolaryngology | Admitting: Otolaryngology

## 2018-12-07 DIAGNOSIS — R1310 Dysphagia, unspecified: Secondary | ICD-10-CM

## 2019-03-24 ENCOUNTER — Other Ambulatory Visit: Payer: Self-pay | Admitting: Orthopedic Surgery

## 2019-03-24 DIAGNOSIS — M542 Cervicalgia: Secondary | ICD-10-CM

## 2019-03-24 DIAGNOSIS — S129XXA Fracture of neck, unspecified, initial encounter: Secondary | ICD-10-CM

## 2019-03-31 ENCOUNTER — Ambulatory Visit
Admission: RE | Admit: 2019-03-31 | Discharge: 2019-03-31 | Disposition: A | Discharge status: Home / Self Care | Payer: BC Managed Care – PPO | Source: Ambulatory Visit | Attending: Orthopedic Surgery | Admitting: Orthopedic Surgery

## 2019-03-31 ENCOUNTER — Other Ambulatory Visit: Payer: Self-pay

## 2019-03-31 DIAGNOSIS — S129XXA Fracture of neck, unspecified, initial encounter: Secondary | ICD-10-CM

## 2019-03-31 DIAGNOSIS — M542 Cervicalgia: Secondary | ICD-10-CM

## 2019-04-01 ENCOUNTER — Ambulatory Visit: Payer: BC Managed Care – PPO | Attending: Internal Medicine

## 2019-04-01 DIAGNOSIS — Z23 Encounter for immunization: Secondary | ICD-10-CM | POA: Insufficient documentation

## 2019-04-01 NOTE — Progress Notes (Signed)
   Covid-19 Vaccination Clinic  Name:  [name]    MRN: [id number] DOB: Nov 29, 1975  04/01/2019  [name] was observed post Covid-19 immunization for 15 minutes without incident. She was provided with Vaccine Information Sheet and instruction to access the V-Safe system.   [name] was instructed to call 911 with any severe reactions post vaccine: [name] Difficulty breathing  . Swelling of face and throat  . A fast heartbeat  . A bad rash all over body  . Dizziness and weakness   Immunizations Administered    Name Date Dose VIS Date Route   Pfizer COVID-19 Vaccine 04/01/2019  1:01 PM 0.3 mL 01/06/2019 Intramuscular   Manufacturer: [place]   Lot: [id number]   [place]: [id number]

## 2019-04-13 IMAGING — CR DG SHOULDER 2+V*L*
3 series · 3 of 3 positions shown · non-contrast
Comparison: None.

CLINICAL DATA: Chronic left shoulder pain without known injury.

EXAM:
LEFT SHOULDER - 2+ VIEW

[w shoulder ap internal left *]
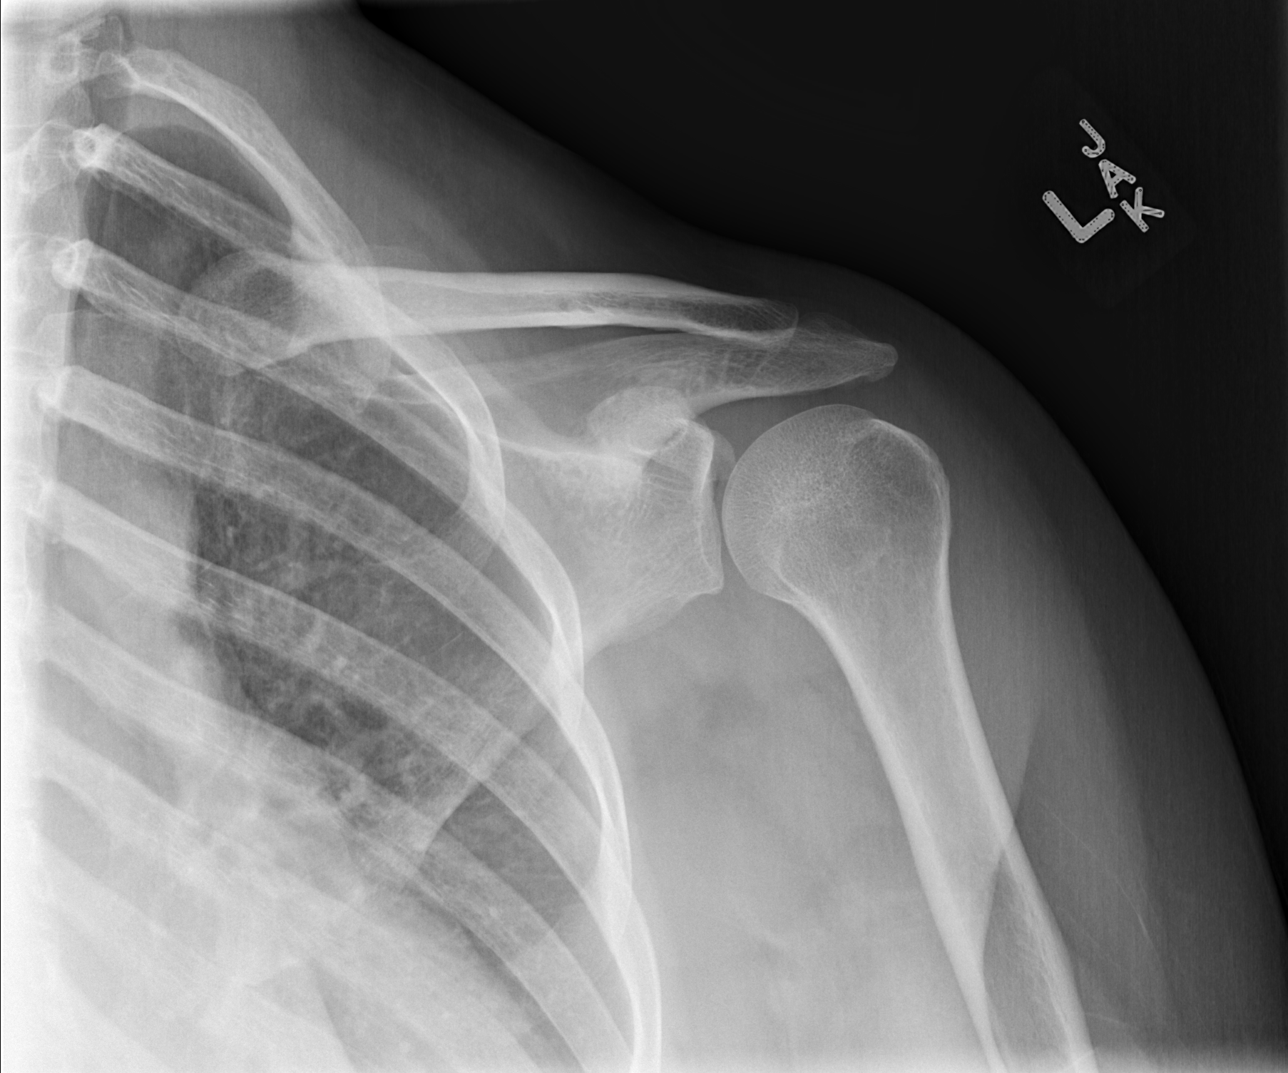

[w shoulder ap external left *]
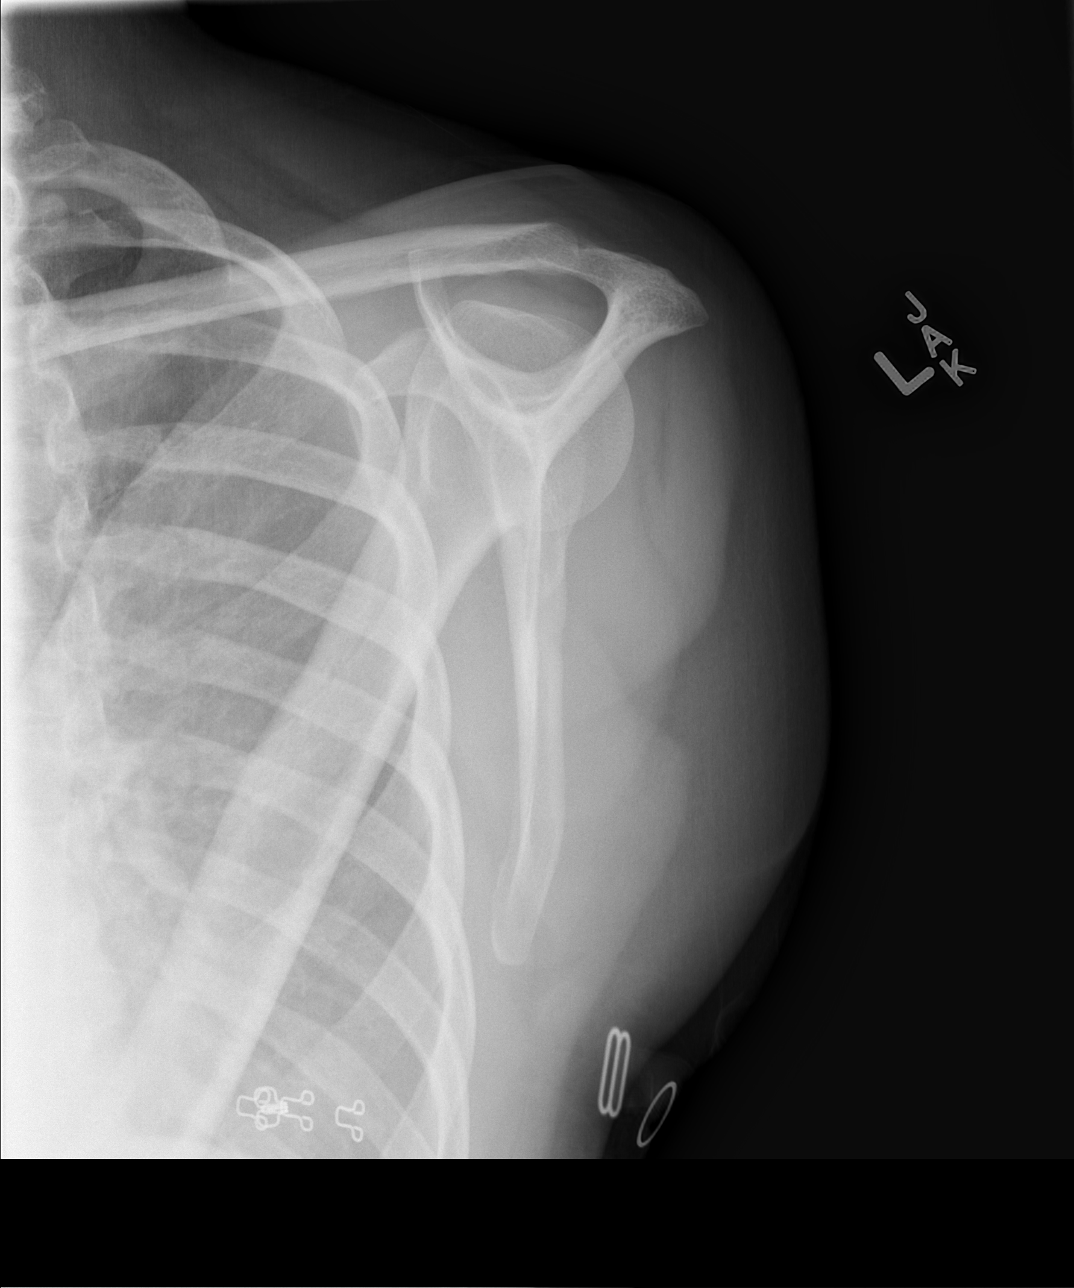

[w shoulder y view left *]
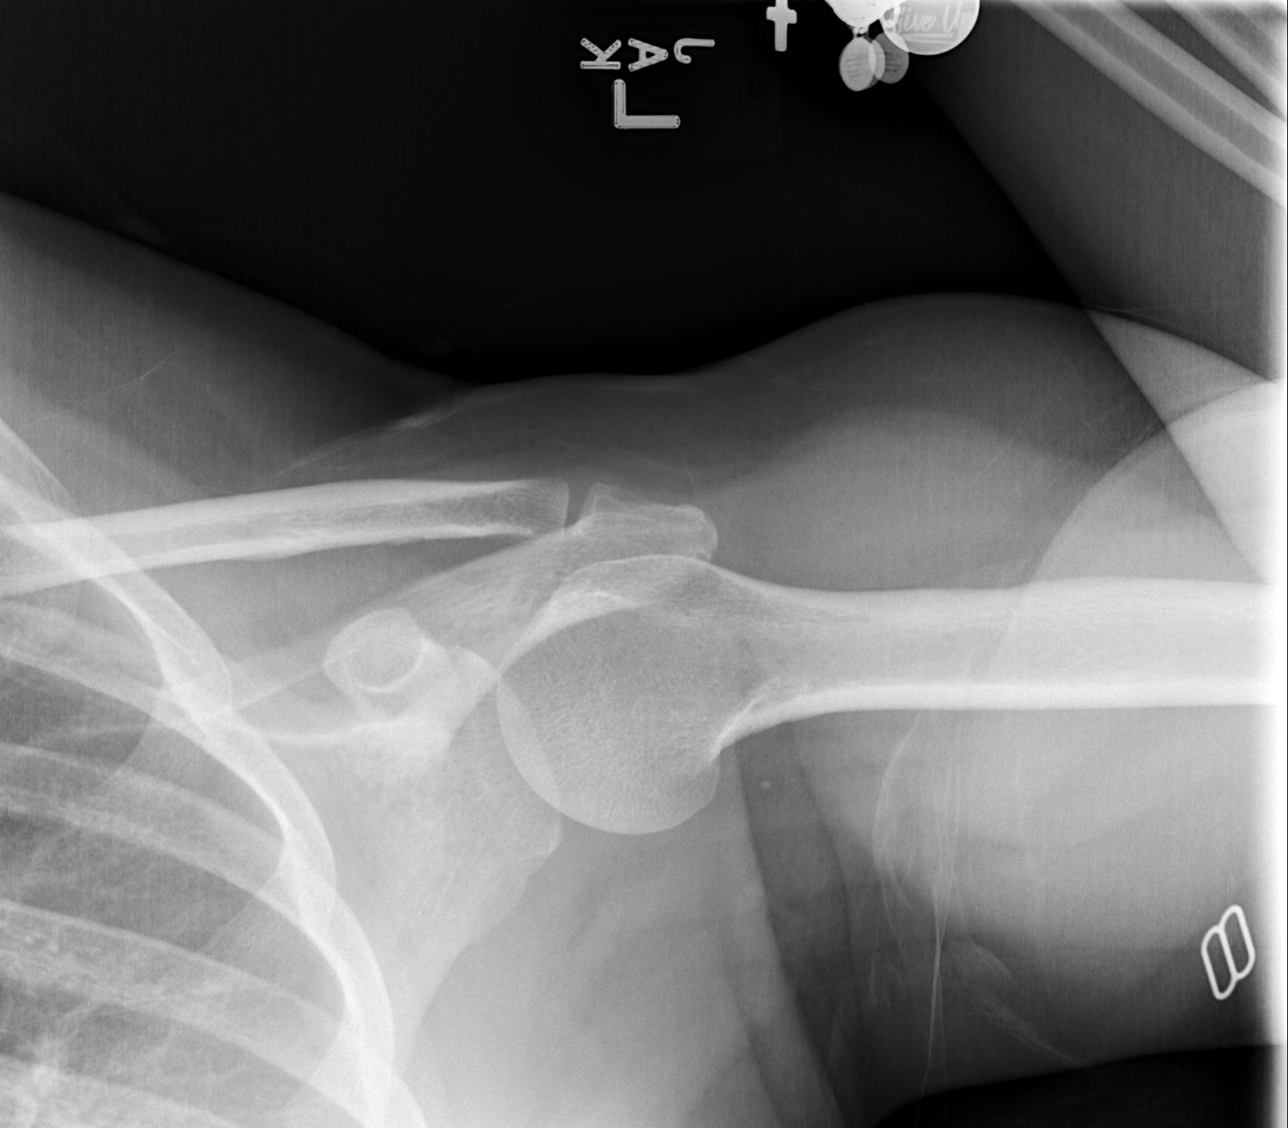

[3 of 3 positions shown; findings below may reference images not displayed]

FINDINGS: There is no evidence of fracture or dislocation. There is no
evidence of arthropathy or other focal bone abnormality. Soft
tissues are unremarkable.
IMPRESSION: Negative.

## 2019-04-22 ENCOUNTER — Ambulatory Visit: Payer: BC Managed Care – PPO | Attending: Internal Medicine

## 2019-04-22 DIAGNOSIS — Z23 Encounter for immunization: Secondary | ICD-10-CM

## 2019-04-22 NOTE — Progress Notes (Signed)
   Covid-19 Vaccination Clinic  Name:  [name]    MRN: [id number] DOB: [birth year]  04/22/2019  [name] was observed post Covid-19 immunization for 15 minutes without incident. She was provided with Vaccine Information Sheet and instruction to access the V-Safe system.   [name] was instructed to call 911 with any severe reactions post vaccine: [name] Difficulty breathing  . Swelling of face and throat  . A fast heartbeat  . A bad rash all over body  . Dizziness and weakness   Immunizations Administered    Name Date Dose VIS Date Route   Pfizer COVID-19 Vaccine 04/22/2019  2:50 PM 0.3 mL 01/06/2019 Intramuscular   Manufacturer: [place]   Lot: [id number]   [place]: [id number]

## 2019-06-02 ENCOUNTER — Telehealth: Payer: Self-pay | Admitting: Family Medicine

## 2019-06-02 NOTE — Telephone Encounter (Signed)
Pt is requesting a call to set up with new pcp. Former pt of [facility]

## 2019-06-02 NOTE — Telephone Encounter (Signed)
Pt would like a cb a concerning who you would recommend for her to see at pcp or tell her about your new practice. Please advise at [phone].

## 2019-06-06 NOTE — Telephone Encounter (Signed)
Spoke with pt and scheduled appt °

## 2019-06-19 ENCOUNTER — Other Ambulatory Visit: Payer: Self-pay | Admitting: Podiatry

## 2019-06-19 ENCOUNTER — Encounter: Payer: Self-pay | Admitting: Podiatry

## 2019-06-19 ENCOUNTER — Ambulatory Visit: Payer: BC Managed Care – PPO | Admitting: Podiatry

## 2019-06-19 ENCOUNTER — Ambulatory Visit (INDEPENDENT_AMBULATORY_CARE_PROVIDER_SITE_OTHER): Payer: BC Managed Care – PPO

## 2019-06-19 ENCOUNTER — Other Ambulatory Visit: Payer: Self-pay

## 2019-06-19 VITALS — Temp 98.0°F

## 2019-06-19 DIAGNOSIS — M779 Enthesopathy, unspecified: Secondary | ICD-10-CM

## 2019-06-19 DIAGNOSIS — L600 Ingrowing nail: Secondary | ICD-10-CM | POA: Diagnosis not present

## 2019-06-19 DIAGNOSIS — R52 Pain, unspecified: Secondary | ICD-10-CM | POA: Diagnosis not present

## 2019-06-19 MED ORDER — NEOMYCIN-POLYMYXIN-HC 3.5-10000-1 OT SOLN: OTIC | 1 refills | Status: DC

## 2019-06-19 NOTE — Patient Instructions (Signed)

## 2019-06-21 NOTE — Progress Notes (Signed)
Subjective:   Patient ID: [name], female   DOB: 44 y.o.   MRN: [id number]   HPI Patient presents stating she gets generalized pain in both feet and states she did injure her left foot when she was young and that is been bothering her.  Also getting ingrown toenails with the right big toe being worse second toe right bothering her and left big toe bothering her.  Patient does not remember any changes and has not noted any active drainage but they are sore.  Patient does not smoke likes to be active   Review of Systems  All other systems reviewed and are negative.       Objective:  Physical Exam Vitals and nursing note reviewed.  Constitutional:      Appearance: She is well-developed.  Pulmonary:     Effort: Pulmonary effort is normal.  Musculoskeletal:        General: Normal range of motion.  Skin:    General: Skin is warm.  Neurological:     Mental Status: She is alert.     Neurovascular status found to be intact muscle strength was found to be adequate range of motion adequate.  Patient does have moderate flatfoot deformity bilateral and I noted that there is an incurvated right hallux lateral border right second toe medial border and left hallux medial border with the right big toe being the worst.  Patient has good digital perfusion well oriented x3     Assessment:  Ingrown toenail deformity right hallux with moderate on the right second toe left hallux with moderate flatfoot deformity noted bilateral     Plan:  H&P all conditions reviewed.  I discussed long-term orthotics which I think will be beneficial for her but first I want to get the painful ingrown toenail fixed and get a focus on the right big toe.  I went ahead and I explained procedure and allowed her to sign consent form understanding risk and I infiltrated the right hallux 60 mg like Marcaine mixture sterile prep applied and using sterile instrumentation I remove the border exposed matrix applied  phenol 3 applications 30 seconds followed by alcohol lavage and sterile dressing.  Gave instructions on soaks and leave dressing on 24 hours but take it off earlier if any throbbing were to occur and wrote prescription for drops to use postoperatively  X-rays indicate depression of the arch bilateral with no other pathology noted with no stress fracture advanced arthritis

## 2019-06-30 ENCOUNTER — Other Ambulatory Visit: Payer: Self-pay

## 2019-06-30 ENCOUNTER — Ambulatory Visit: Payer: BC Managed Care – PPO | Admitting: Family Medicine

## 2019-06-30 ENCOUNTER — Encounter: Payer: Self-pay | Admitting: Family Medicine

## 2019-06-30 VITALS — BP 128/79 | HR 98 | Temp 98.4°F | Ht 61.0 in | Wt 177.0 lb

## 2019-06-30 DIAGNOSIS — F419 Anxiety disorder, unspecified: Secondary | ICD-10-CM | POA: Diagnosis not present

## 2019-06-30 DIAGNOSIS — F329 Major depressive disorder, single episode, unspecified: Secondary | ICD-10-CM | POA: Diagnosis not present

## 2019-06-30 DIAGNOSIS — Z905 Acquired absence of kidney: Secondary | ICD-10-CM

## 2019-06-30 DIAGNOSIS — Z23 Encounter for immunization: Secondary | ICD-10-CM | POA: Diagnosis not present

## 2019-06-30 MED ORDER — BUSPIRONE HCL 10 MG PO TABS: 10.0000 mg | ORAL_TABLET | Freq: Three times a day (TID) | ORAL | 3 refills | Status: DC

## 2019-06-30 NOTE — Progress Notes (Signed)
6/4/20214:15 PM  [name] [birth year], 45 y.o., female [id number]  Chief Complaint  Patient presents with  . New Patient (Initial Visit)    HPI:   Patient is a 44 y.o. 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, in person She has completed covid vaccine  GAD 7 : Generalized Anxiety Score 06/30/2019  Nervous, Anxious, on Edge 0  Control/stop worrying 0  Worry too much - different things 0  Trouble relaxing 0  Restless 0  Easily annoyed or irritable 0  Afraid - awful might happen 0  Total GAD 7 Score 0  Anxiety Difficulty Not difficult at all     Depression screen [facility] 2/9 06/30/2019 09/05/2018 01/17/2018  Decreased Interest 0 0 0  Down, Depressed, Hopeless 0 0 0  PHQ - 2 Score 0 0 0    Fall Risk  06/30/2019 09/05/2018 01/17/2018 01/04/2018 12/01/2017  Falls in the past year? 0 0 0 0 0  Number falls in past yr: 0 0 - - -  Injury with Fall? 0 0 - - -  Follow up Falls evaluation completed Falls evaluation completed - - -     No Known Allergies  Prior to Admission medications   Medication Sig Start Date End Date Taking? Authorizing Provider  acetaminophen (TYLENOL) 500 MG tablet Take 1,000 mg by mouth every 6 (six) hours as needed for moderate pain.    Yes [provider]  Calcium Carbonate-Simethicone (ALKA-SELTZER HEARTBURN + GAS) 750-80 MG CHEW Chew 2 tablets by mouth daily as needed (acid reflux).   Yes [provider]  diphenhydrAMINE (BENADRYL) 25 MG tablet Take 50 mg by mouth every 6 (six) hours as needed for allergies.   Yes [provider]  neomycin-polymyxin-hydrocortisone (CORTISPORIN) OTIC solution Apply 1-2 drops to toe after soaking BID 06/19/19  Yes Regal, Tamala Fothergill, DPM  polyethylene glycol (MIRALAX / GLYCOLAX) packet Take 17 g by mouth daily as needed for moderate constipation.    Yes [provider]  sertraline (ZOLOFT) 50 MG tablet Take 125 mg by mouth daily.  11/26/17  Yes [provider]    Past Medical History:  Diagnosis Date  . Anxiety   . Depression   . GERD (gastroesophageal reflux disease)   . History of hiatal hernia   . 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 Synovitis of wrist    RIGHT WRIST  . Tear of triangular fibrocartilage complex (TFCC)    RIGHT WRIST    Past Surgical History:  Procedure Laterality Date  . ABDOMINAL HYSTERECTOMY    . ANTERIOR CERVICAL DECOMPRESSION/DISCECTOMY FUSION 4 LEVELS N/A 08/18/2018   Procedure: ANTERIOR CERVICAL DECOMPRESSION FUSION, CERVICAL THREE TO FOUR, CERVICAL FOUR TO FIVE, CERVICAL FIVE TO SIX WITH INSTRUMENTATION AND ALLOGRAFT.;  Surgeon: Phylliss Bob, MD;  Location: [place];  Service: Orthopedics;  Laterality: N/A;  . BENIGN BREAST BX  1993  . BREAST SURGERY    .  CESAREAN SECTION  2001  & 2003   2003  W/  BILATERAL TUBAL LIGATION  . CESAREAN SECTION N/A    [employer]reesia 06/27/2019  . CYSTO/  RIGHT RETROGRADE PYELOGRAM/  RIGHT URETERAL STENT PLACMENT   03-01-2002  . CYSTO/  STENT REMOVAL/  RIGHT ACCUSIZE RETROGRADE URETEROPELVIC JUNCTION INCISION AND STENT REPLACEMENT  12-05-2001  . EYE SURGERY    . HERNIA REPAIR    . LAPAROSCOPIC ASSISTED VAGINAL HYSTERECTOMY  2014  . LEEP/  D & C HYSTEROSCOPY/  NOVASURE ENDOMETRIAL ABLATION  08-23-2006  . REPAIR LEFT TEAR DUCT INJURY  2013  . SPINE SURGERY N/A    [employer]reesia 06/27/2019  . TOTAL NEPHRECTOMY Right  04-12-2002  . TUBAL LIGATION    . UMBILICAL HERNIA REPAIR  12-11-2008   LAPAROSCOPIC  . WRIST ARTHROSCOPY Right 09/19/2014   Procedure: RIGHT WRIST ARTHROSCOPY AND TFCC ((TRIANGULAR FIBROCARTILAGE COMPLEX) DEBRIDEMENT ;  Surgeon: Iran Planas, MD;  Location: [place];  Service: Orthopedics;  Laterality: Right;    Social History   Tobacco Use  . Smoking status: Never Smoker  . Smokeless tobacco: Never Used  Substance Use Topics  . Alcohol use: No    Alcohol/week: 0.0 standard drinks    Family History  Problem Relation Age of Onset  . Diabetes Father   . Asthma Brother   . Cancer, pancreatic Maternal Grandmother   . Mental illness Mother        anxiety, Bipolar disorder  . Asthma Son   . Diabetes Brother   . Hyperlipidemia Brother   . Asthma Son     ROS Per hpi  OBJECTIVE:  Today's Vitals   06/30/19 1602  BP: 128/79  Pulse: 98  Temp: 98.4 F (36.9 C)  TempSrc: Temporal  SpO2: 95%  Weight: 177 lb (80.3 kg)  Height: '5\' 1"'  (1.549 m)   Body mass index is 33.44 kg/m.   Physical Exam Vitals and nursing note reviewed.  Constitutional:      Appearance: She is well-developed.  HENT:     Head: Normocephalic and atraumatic.  Eyes:     General: No scleral icterus.    Conjunctiva/sclera: Conjunctivae normal.     Pupils: Pupils are equal, round, and reactive to light.  Pulmonary:     Effort: Pulmonary effort is normal.  Musculoskeletal:     Cervical back: Neck supple.  Skin:    General: Skin is warm and dry.  Neurological:     Mental Status: She is alert and oriented to person, place, and time.     No results found for this or any previous visit (from the past 24 hour(s)).  No results found.   ASSESSMENT and PLAN  1. Anxiety and depression Improved but not fully controlled. Increasing buspar TID.  - TSH  2. Need for Tdap vaccination - Tdap vaccine greater than or equal to 7yo IM  3. History of nephrectomy, right -  CMP14+EGFR  Other orders - busPIRone (BUSPAR) 10 MG tablet; Take 1 tablet (10 mg total) by mouth 3 (three) times daily.  Return in about 6 weeks (around 08/11/2019) for anxiety.    Rutherford Guys, MD Primary Care at [place], [place] [address] Ph.  [phone] Fax [phone]

## 2019-06-30 NOTE — Patient Instructions (Signed)
° ° ° °  If you have lab work done today you will be contacted with your lab results within the next 2 weeks.  If you have not heard from us then please contact us. The fastest way to get your results is to register for My Chart. ° ° °IF you received an x-ray today, you will receive an invoice from [facility]. Please contact [facility] at [phone] with questions or concerns regarding your invoice.  ° °IF you received labwork today, you will receive an invoice from [facility]. Please contact [facility] at [phone] with questions or concerns regarding your invoice.  ° °Our billing staff will not be able to assist you with questions regarding bills from these companies. ° °You will be contacted with the lab results as soon as they are available. The fastest way to get your results is to activate your My Chart account. Instructions are located on the last page of this paperwork. If you have not heard from us regarding the results in 2 weeks, please contact this office. °  ° ° ° °

## 2019-07-01 LAB — CMP14+EGFR
ALT: 17 IU/L (ref 0–32)
AST: 21 IU/L (ref 0–40)
Albumin/Globulin Ratio: 1.9 (ref 1.2–2.2)
Albumin: 4.3 g/dL (ref 3.8–4.8)
Alkaline Phosphatase: 66 IU/L (ref 48–121)
BUN/Creatinine Ratio: 9 (ref 9–23)
BUN: 8 mg/dL (ref 6–24)
Bilirubin Total: 0.2 mg/dL (ref 0.0–1.2)
CO2: 23 mmol/L (ref 20–29)
Calcium: 9.6 mg/dL (ref 8.7–10.2)
Chloride: 105 mmol/L (ref 96–106)
Creatinine, Ser: 0.93 mg/dL (ref 0.57–1.00)
GFR calc Af Amer: 85 mL/min/{1.73_m2} (ref >59)
GFR calc non Af Amer: 74 mL/min/{1.73_m2} (ref >59)
Globulin, Total: 2.3 g/dL (ref 1.5–4.5)
Glucose: 107 mg/dL — ABNORMAL HIGH (ref 65–99)
Potassium: 3.8 mmol/L (ref 3.5–5.2)
Sodium: 144 mmol/L (ref 134–144)
Total Protein: 6.6 g/dL (ref 6.0–8.5)

## 2019-07-01 LAB — TSH: TSH: 1.2 u[IU]/mL (ref 0.450–4.500)

## 2019-07-06 ENCOUNTER — Other Ambulatory Visit: Payer: Self-pay

## 2019-07-06 ENCOUNTER — Ambulatory Visit: Payer: BC Managed Care – PPO | Admitting: Podiatry

## 2019-07-06 ENCOUNTER — Encounter: Payer: Self-pay | Admitting: Podiatry

## 2019-07-06 VITALS — Temp 97.3°F

## 2019-07-06 DIAGNOSIS — M779 Enthesopathy, unspecified: Secondary | ICD-10-CM

## 2019-07-06 DIAGNOSIS — L600 Ingrowing nail: Secondary | ICD-10-CM

## 2019-07-06 NOTE — Patient Instructions (Signed)

## 2019-07-09 NOTE — Progress Notes (Signed)
Subjective:   Patient ID: [name], female   DOB: 44 y.o.   MRN: [id number]   HPI Patient continues to experience discomfort in both feet that is been chronic in nature and that the right nails are healing well after previous nail surgery but the left big toenail on the lateral side is very tender and making it hard to wear shoe gear without discomfort.  Patient states this is been going for a while   ROS      Objective:  Physical Exam  Neurovascular status intact with patient's right nails healing well and an incurvated left hallux lateral corner that is painful when pressed with no active redness or drainage noted.  Patient also has moderate flatfoot structure and foot pain bilateral that she deals with on a continuing basis     Assessment:  Ingrown toenail deformity left hallux lateral border with pain along with chronic tendinitis bilateral     Plan:  H&P reviewed both conditions.  For the nail we recommended correction she wants this done I allowed her to read and signed consent form understanding risk and today I infiltrated the left hallux 60 mg like Marcaine mixture sterile prep applied and using sterile instrumentation I removed the border exposed matrix and applied phenol 3 applications 30 seconds followed by alcohol by sterile dressing.  I then discussed overall chronic foot pain and instability of her arch and she is opted for orthotics and I am going to have her see ped orthotist for consultation and casting for functional orthotic devices

## 2019-07-18 ENCOUNTER — Other Ambulatory Visit: Payer: Self-pay

## 2019-07-18 ENCOUNTER — Ambulatory Visit (INDEPENDENT_AMBULATORY_CARE_PROVIDER_SITE_OTHER): Payer: BC Managed Care – PPO | Admitting: Orthotics

## 2019-07-18 DIAGNOSIS — M778 Other enthesopathies, not elsewhere classified: Secondary | ICD-10-CM

## 2019-07-18 DIAGNOSIS — R52 Pain, unspecified: Secondary | ICD-10-CM

## 2019-07-18 DIAGNOSIS — M779 Enthesopathy, unspecified: Secondary | ICD-10-CM

## 2019-07-19 NOTE — Progress Notes (Signed)

## 2019-07-24 ENCOUNTER — Encounter (HOSPITAL_COMMUNITY): Payer: Self-pay | Admitting: Orthopedic Surgery

## 2019-07-28 ENCOUNTER — Other Ambulatory Visit: Payer: Self-pay | Admitting: Family Medicine

## 2019-08-08 ENCOUNTER — Ambulatory Visit: Payer: BC Managed Care – PPO | Admitting: Orthotics

## 2019-08-08 ENCOUNTER — Other Ambulatory Visit: Payer: Self-pay

## 2019-08-08 DIAGNOSIS — M779 Enthesopathy, unspecified: Secondary | ICD-10-CM

## 2019-08-09 NOTE — Progress Notes (Signed)
Patient came in today to pick up custom made foot orthotics.  The goals were accomplished and the patient reported no dissatisfaction with said orthotics.  Patient was advised of breakin period and how to report any issues. 

## 2019-09-06 ENCOUNTER — Other Ambulatory Visit: Payer: Self-pay | Admitting: Family Medicine

## 2019-09-06 NOTE — Telephone Encounter (Signed)
° °  Notes to clinic: Patient requesting 90 day wit 2 refills   Requested Prescriptions  Pending Prescriptions Disp Refills   busPIRone (BUSPAR) 10 MG tablet [Pharmacy Med Name: BUSPIRONE HCL 10 MG TABLET] 270 tablet 2    Sig: TAKE 1 TABLET BY MOUTH THREE TIMES A DAY      Psychiatry: Anxiolytics/Hypnotics - Non-controlled Passed - 09/06/2019  9:30 AM      Passed - Valid encounter within last 6 months    Recent Outpatient Visits           2 months ago Anxiety and depression   [facility], Lilia Argue, MD   1 year ago Postoperative pulmonary edema [facility])   Primary Care at [facility], Arlie Solomons, MD   1 year ago Left anterior shoulder pain   [facility], Lilia Argue, MD   1 year ago Left anterior shoulder pain   [facility], Lilia Argue, MD   1 year ago Cough   [facility], Ranell Patrick, MD

## 2019-11-03 ENCOUNTER — Other Ambulatory Visit: Payer: Self-pay | Admitting: Family Medicine

## 2019-11-03 NOTE — Telephone Encounter (Signed)
Requested Prescriptions  Pending Prescriptions Disp Refills   busPIRone (BUSPAR) 10 MG tablet [Pharmacy Med Name: BUSPIRONE HCL 10 MG TABLET] 270 tablet 0    Sig: TAKE 1 TABLET BY MOUTH THREE TIMES A DAY     Psychiatry: Anxiolytics/Hypnotics - Non-controlled Passed - 11/03/2019  2:30 PM      Passed - Valid encounter within last 6 months    Recent Outpatient Visits          4 months ago Anxiety and depression   [facility], Lilia Argue, MD   1 year ago Postoperative pulmonary edema [facility])   [facility] at [facility], Arlie Solomons, MD   1 year ago Left anterior shoulder pain   [facility], Lilia Argue, MD   1 year ago Left anterior shoulder pain   [facility], Lilia Argue, MD   1 year ago Cough   [facility], Ranell Patrick, MD             Pharmacy requesting 90 day supply

## 2020-04-22 ENCOUNTER — Other Ambulatory Visit: Payer: Self-pay | Admitting: Orthopedic Surgery

## 2020-05-06 NOTE — Progress Notes (Signed)
Surgical Instructions    Your procedure is scheduled on 05/08/20.  Report to [facility] Main Entrance "A" at 05:30 A.M., then check in with the Admitting office.  Call this number if you have problems the morning of surgery:  [phone]   If you have any questions prior to your surgery date call [phone]: Open Monday-Friday 8am-4pm    Remember:  Do not eat after midnight the night before your surgery  You may drink clear liquids until 05:30am the morning of your surgery.   Clear liquids allowed are: Water, Non-Citrus Juices (without pulp), Carbonated Beverages, Clear Tea, Black Coffee Only, and Gatorade  Patient Instructions  . The night before surgery:  o No food after midnight. ONLY clear liquids after midnight  . The day of surgery (if you do NOT have diabetes):  o Drink ONE (1) Pre-Surgery Clear Ensure by 05:30am the morning of surgery. Drink in one sitting. Do not sip.  o This drink was given to you during your hospital  pre-op appointment visit. o Nothing else to drink after completing the  Pre-Surgery Clear Ensure.          If you have questions, please contact your surgeon's office.     Take these medicines the morning of surgery with A SIP OF WATER  acetaminophen (TYLENOL) if needed busPIRone (BUSPAR) if needed diphenhydrAMINE (BENADRYL)  If needed   As of today, STOP taking any Aspirin (unless otherwise instructed by your surgeon) Aleve, Naproxen, Ibuprofen, Motrin, Advil, Goody's, BC's, all herbal medications, fish oil, and all vitamins.                     Do not wear jewelry, make up, or nail polish            Do not wear lotions, powders, perfumes/colognes, or deodorant.            Do not shave 48 hours prior to surgery.              Do not bring valuables to the hospital.            [facility] is not responsible for any belongings or valuables.  Do NOT Smoke (Tobacco/Vaping) or drink Alcohol 24 hours prior to your procedure If you use a CPAP at  night, you may bring all equipment for your overnight stay.   Contacts, glasses, dentures or bridgework may not be worn into surgery, please bring cases for these belongings   For patients admitted to the hospital, discharge time will be determined by your treatment team.   Patients discharged the day of surgery will not be allowed to drive home, and someone needs to stay with them for 24 hours.    Special instructions:   [facility]- Preparing For Surgery  Before surgery, you can play an important role. Because skin is not sterile, your skin needs to be as free of germs as possible. You can reduce the number of germs on your skin by washing with CHG (chlorahexidine gluconate) Soap before surgery.  CHG is an antiseptic cleaner which kills germs and bonds with the skin to continue killing germs even after washing.    Oral Hygiene is also important to reduce your risk of infection.  Remember - BRUSH YOUR TEETH THE MORNING OF SURGERY WITH YOUR REGULAR TOOTHPASTE  Please do not use if you have an allergy to CHG or antibacterial soaps. If your skin becomes reddened/irritated stop using the CHG.  Do not shave (including  legs and underarms) for at least 48 hours prior to first CHG shower. It is OK to shave your face.  Please follow these instructions carefully.   1. Shower the NIGHT BEFORE SURGERY and the MORNING OF SURGERY  2. If you chose to wash your hair, wash your hair first as usual with your normal shampoo.  3. After you shampoo, rinse your hair and body thoroughly to remove the shampoo.  4. Wash Face and genitals (private parts) with your normal soap.   5.  Shower the NIGHT BEFORE SURGERY and the MORNING OF SURGERY with CHG Soap.   6. Use CHG Soap as you would any other liquid soap. You can apply CHG directly to the skin and wash gently with a scrungie or a clean washcloth.   7. Apply the CHG Soap to your body ONLY FROM THE NECK DOWN.  Do not use on open wounds or open sores. Avoid  contact with your eyes, ears, mouth and genitals (private parts). Wash Face and genitals (private parts)  with your normal soap.   8. Wash thoroughly, paying special attention to the area where your surgery will be performed.  9. Thoroughly rinse your body with warm water from the neck down.  10. DO NOT shower/wash with your normal soap after using and rinsing off the CHG Soap.  11. Pat yourself dry with a CLEAN TOWEL.  12. Wear CLEAN PAJAMAS to bed the night before surgery  13. Place CLEAN SHEETS on your bed the night before your surgery  14. DO NOT SLEEP WITH PETS.   Day of Surgery: Take a shower with CHG soap.  Wear Clean/Comfortable clothing the morning of surgery Do not apply any deodorants/lotions.   Remember to brush your teeth WITH YOUR REGULAR TOOTHPASTE.   Please read over the following fact sheets that you were given.

## 2020-05-07 ENCOUNTER — Other Ambulatory Visit: Payer: Self-pay

## 2020-05-07 ENCOUNTER — Encounter (HOSPITAL_COMMUNITY): Payer: Self-pay

## 2020-05-07 ENCOUNTER — Encounter (HOSPITAL_COMMUNITY)
Admission: RE | Admit: 2020-05-07 | Discharge: 2020-05-07 | Disposition: A | Discharge status: Home / Self Care | Payer: BC Managed Care – PPO | Source: Ambulatory Visit | Attending: Orthopedic Surgery | Admitting: Orthopedic Surgery

## 2020-05-07 DIAGNOSIS — Z01812 Encounter for preprocedural laboratory examination: Secondary | ICD-10-CM | POA: Insufficient documentation

## 2020-05-07 DIAGNOSIS — Z20822 Contact with and (suspected) exposure to covid-19: Secondary | ICD-10-CM | POA: Insufficient documentation

## 2020-05-07 HISTORY — DX: Headache, unspecified: R51.9

## 2020-05-07 LAB — COMPREHENSIVE METABOLIC PANEL
ALT: 19 U/L (ref 0–44)
AST: 23 U/L (ref 15–41)
Albumin: 4.2 g/dL (ref 3.5–5.0)
Alkaline Phosphatase: 50 U/L (ref 38–126)
Anion gap: 9 (ref 5–15)
BUN: 5 mg/dL — ABNORMAL LOW (ref 6–20)
CO2: 25 mmol/L (ref 22–32)
Calcium: 9.3 mg/dL (ref 8.9–10.3)
Chloride: 103 mmol/L (ref 98–111)
Creatinine, Ser: 0.91 mg/dL (ref 0.44–1.00)
GFR, Estimated: >60 mL/min (ref >60)
Glucose, Bld: 80 mg/dL (ref 70–99)
Potassium: 3.3 mmol/L — ABNORMAL LOW (ref 3.5–5.1)
Sodium: 137 mmol/L (ref 135–145)
Total Bilirubin: 0.4 mg/dL (ref 0.3–1.2)
Total Protein: 7.7 g/dL (ref 6.5–8.1)

## 2020-05-07 LAB — URINALYSIS, ROUTINE W REFLEX MICROSCOPIC
Bilirubin Urine: NEGATIVE
Glucose, UA: NEGATIVE mg/dL
Hgb urine dipstick: NEGATIVE
Ketones, ur: NEGATIVE mg/dL
Leukocytes,Ua: NEGATIVE
Nitrite: NEGATIVE
Protein, ur: NEGATIVE mg/dL
Specific Gravity, Urine: 1.02 (ref 1.005–1.030)
pH: 5 (ref 5.0–8.0)

## 2020-05-07 LAB — SURGICAL PCR SCREEN
MRSA, PCR: NEGATIVE
Staphylococcus aureus: NEGATIVE

## 2020-05-07 LAB — CBC WITH DIFFERENTIAL/PLATELET
Abs Immature Granulocytes: 0.02 10*3/uL (ref 0.00–0.07)
Basophils Absolute: 0.1 10*3/uL (ref 0.0–0.1)
Basophils Relative: 1 %
Eosinophils Absolute: 0.1 10*3/uL (ref 0.0–0.5)
Eosinophils Relative: 1 %
HCT: 41.5 % (ref 36.0–46.0)
Hemoglobin: 13.6 g/dL (ref 12.0–15.0)
Immature Granulocytes: 0 %
Lymphocytes Relative: 44 %
Lymphs Abs: 3.2 10*3/uL (ref 0.7–4.0)
MCH: 30 pg (ref 26.0–34.0)
MCHC: 32.8 g/dL (ref 30.0–36.0)
MCV: 91.4 fL (ref 80.0–100.0)
Monocytes Absolute: 0.5 10*3/uL (ref 0.1–1.0)
Monocytes Relative: 7 %
Neutro Abs: 3.4 10*3/uL (ref 1.7–7.7)
Neutrophils Relative %: 47 %
Platelets: 267 10*3/uL (ref 150–400)
RBC: 4.54 MIL/uL (ref 3.87–5.11)
RDW: 13.1 % (ref 11.5–15.5)
WBC: 7.2 10*3/uL (ref 4.0–10.5)
nRBC: 0 % (ref 0.0–0.2)

## 2020-05-07 LAB — TYPE AND SCREEN
ABO/RH(D): A POS
Antibody Screen: NEGATIVE

## 2020-05-07 LAB — APTT: aPTT: 29 seconds (ref 24–36)

## 2020-05-07 LAB — PROTIME-INR
INR: 1 (ref 0.8–1.2)
Prothrombin Time: 13.2 seconds (ref 11.4–15.2)

## 2020-05-07 LAB — SARS CORONAVIRUS 2 (TAT 6-24 HRS): SARS Coronavirus 2: NEGATIVE

## 2020-05-07 NOTE — Progress Notes (Signed)
PCP: Dr. Nolon Rod Cardiologist: denies  EKG: n/a CXR: n/a ECHO: 7-24/20 Stress Test: denies Cardiac Cath: denies  Covid tested today, aware to quarantine   Patient denies shortness of breath, fever, cough, and chest pain at PAT appointment.  Patient verbalized understanding of instructions provided today at the PAT appointment.  Patient asked to review instructions at home and day of surgery.

## 2020-05-07 NOTE — Anesthesia Preprocedure Evaluation (Addendum)
Anesthesia Evaluation  Patient identified by MRN, date of birth, ID band Patient awake    Reviewed: Allergy & Precautions, NPO status , Patient's Chart, lab work & pertinent test results  Airway Mallampati: II   Neck ROM: Limited  Mouth opening: Limited Mouth Opening  Dental  (+) Teeth Intact   Pulmonary neg pulmonary ROS,    Pulmonary exam normal        Cardiovascular negative cardio ROS   Rhythm:Regular Rate:Normal     Neuro/Psych  Headaches, Anxiety Depression    GI/Hepatic Neg liver ROS, GERD  ,  Endo/Other  negative endocrine ROS  Renal/GU negative Renal ROS  negative genitourinary   Musculoskeletal  (+) Arthritis , neuroforaminal stenosis cervical   Abdominal (+)  Abdomen: soft. Bowel sounds: normal.  Peds  Hematology negative hematology ROS (+)   Anesthesia Other Findings   Reproductive/Obstetrics                            Anesthesia Physical Anesthesia Plan  ASA: II  Anesthesia Plan: General   Post-op Pain Management:    Induction: Intravenous  PONV Risk Score and Plan: 3 and Ondansetron, Dexamethasone, Midazolam and Treatment may vary due to age or medical condition  Airway Management Planned: Mask and Oral ETT  Additional Equipment: None  Intra-op Plan:   Post-operative Plan: Extubation in OR  Informed Consent: I have reviewed the patients History and Physical, chart, labs and discussed the procedure including the risks, benefits and alternatives for the proposed anesthesia with the patient or authorized representative who has indicated his/her understanding and acceptance.     Dental advisory given  Plan Discussed with: CRNA  Anesthesia Plan Comments: (Lab Results      Component                Value               Date                      WBC                      7.2                 05/07/2020                HGB                      13.6                 05/07/2020                HCT                      41.5                05/07/2020                MCV                      91.4                05/07/2020                PLT                      267  05/07/2020           .Lab Results      Component                Value               Date                      NA                       137                 05/07/2020                K                        3.3 (L)             05/07/2020                CO2                      25                  05/07/2020                GLUCOSE                  80                  05/07/2020                BUN                      5 (L)               05/07/2020                CREATININE               0.91                05/07/2020                CALCIUM                  9.3                 05/07/2020                GFRNONAA                 >60                 05/07/2020                GFRAA                    85                  06/30/2019           Lab Results)       Anesthesia Quick Evaluation

## 2020-05-08 ENCOUNTER — Other Ambulatory Visit: Payer: Self-pay

## 2020-05-08 ENCOUNTER — Ambulatory Visit (HOSPITAL_COMMUNITY): Payer: BC Managed Care – PPO

## 2020-05-08 ENCOUNTER — Encounter (HOSPITAL_COMMUNITY): Payer: Self-pay | Admitting: Orthopedic Surgery

## 2020-05-08 ENCOUNTER — Ambulatory Visit (HOSPITAL_COMMUNITY): Payer: BC Managed Care – PPO | Admitting: Physician Assistant

## 2020-05-08 ENCOUNTER — Observation Stay (HOSPITAL_COMMUNITY)
Admission: RE | Admit: 2020-05-08 | Discharge: 2020-05-09 | Disposition: A | Discharge status: Home / Self Care | Payer: BC Managed Care – PPO | Attending: Orthopedic Surgery | Admitting: Orthopedic Surgery

## 2020-05-08 ENCOUNTER — Ambulatory Visit (HOSPITAL_COMMUNITY)
Admission: RE | Disposition: A | Discharge status: Home / Self Care | Payer: Self-pay | Source: Home / Self Care | Attending: Orthopedic Surgery

## 2020-05-08 DIAGNOSIS — F419 Anxiety disorder, unspecified: Secondary | ICD-10-CM | POA: Diagnosis not present

## 2020-05-08 DIAGNOSIS — M4802 Spinal stenosis, cervical region: Secondary | ICD-10-CM | POA: Insufficient documentation

## 2020-05-08 DIAGNOSIS — M5410 Radiculopathy, site unspecified: Secondary | ICD-10-CM | POA: Diagnosis present

## 2020-05-08 DIAGNOSIS — F329 Major depressive disorder, single episode, unspecified: Secondary | ICD-10-CM | POA: Insufficient documentation

## 2020-05-08 DIAGNOSIS — Z79899 Other long term (current) drug therapy: Secondary | ICD-10-CM | POA: Diagnosis not present

## 2020-05-08 DIAGNOSIS — Z905 Acquired absence of kidney: Secondary | ICD-10-CM | POA: Insufficient documentation

## 2020-05-08 DIAGNOSIS — M5412 Radiculopathy, cervical region: Principal | ICD-10-CM | POA: Insufficient documentation

## 2020-05-08 DIAGNOSIS — Z419 Encounter for procedure for purposes other than remedying health state, unspecified: Secondary | ICD-10-CM

## 2020-05-08 HISTORY — PX: ANTERIOR CERVICAL DECOMP/DISCECTOMY FUSION: SHX1161

## 2020-05-08 SURGERY — ANTERIOR CERVICAL DECOMPRESSION/DISCECTOMY FUSION 1 LEVEL
Anesthesia: General | Site: Neck

## 2020-05-08 MED ORDER — LIDOCAINE 2% (20 MG/ML) 5 ML SYRINGE
INTRAMUSCULAR | Status: DC | PRN
  Administered 2020-05-08: 60 mg via INTRAVENOUS

## 2020-05-08 MED ORDER — ALUM & MAG HYDROXIDE-SIMETH 200-200-20 MG/5ML PO SUSP: 30.0000 mL | Freq: Four times a day (QID) | ORAL | Status: DC | PRN

## 2020-05-08 MED ORDER — SENNOSIDES-DOCUSATE SODIUM 8.6-50 MG PO TABS: 1.0000 | ORAL_TABLET | Freq: Every evening | ORAL | Status: DC | PRN

## 2020-05-08 MED ORDER — ONDANSETRON HCL 4 MG/2ML IJ SOLN
INTRAMUSCULAR | Status: DC | PRN
  Administered 2020-05-08: 4 mg via INTRAVENOUS

## 2020-05-08 MED ORDER — BUSPIRONE HCL 10 MG PO TABS
10.0000 mg | ORAL_TABLET | Freq: Three times a day (TID) | ORAL | Status: DC
  Administered 2020-05-08 – 2020-05-09 (×3): 10 mg via ORAL
  Filled 2020-05-08 (×5): qty 1

## 2020-05-08 MED ORDER — PHENYLEPHRINE HCL-NACL 10-0.9 MG/250ML-% IV SOLN
INTRAVENOUS | Status: DC | PRN
  Administered 2020-05-08: 80 ug/min via INTRAVENOUS

## 2020-05-08 MED ORDER — METHOCARBAMOL 500 MG PO TABS
500.0000 mg | ORAL_TABLET | Freq: Four times a day (QID) | ORAL | Status: DC | PRN
  Administered 2020-05-08 – 2020-05-09 (×3): 500 mg via ORAL
  Filled 2020-05-08 (×3): qty 1

## 2020-05-08 MED ORDER — BUPIVACAINE-EPINEPHRINE 0.25% -1:200000 IJ SOLN
INTRAMUSCULAR | Status: DC | PRN
  Administered 2020-05-08: 5 mL

## 2020-05-08 MED ORDER — ONDANSETRON HCL 4 MG/2ML IJ SOLN
4.0000 mg | Freq: Four times a day (QID) | INTRAMUSCULAR | Status: DC | PRN
  Administered 2020-05-08: 4 mg via INTRAVENOUS
  Filled 2020-05-08: qty 2

## 2020-05-08 MED ORDER — SUGAMMADEX SODIUM 200 MG/2ML IV SOLN
INTRAVENOUS | Status: DC | PRN
  Administered 2020-05-08: 200 mg via INTRAVENOUS

## 2020-05-08 MED ORDER — METHOCARBAMOL 1000 MG/10ML IJ SOLN
500.0000 mg | Freq: Four times a day (QID) | INTRAVENOUS | Status: DC | PRN
  Filled 2020-05-08: qty 5

## 2020-05-08 MED ORDER — FENTANYL CITRATE (PF) 250 MCG/5ML IJ SOLN
INTRAMUSCULAR | Status: DC | PRN
  Administered 2020-05-08 (×2): 100 ug via INTRAVENOUS

## 2020-05-08 MED ORDER — HYDROMORPHONE HCL 1 MG/ML IJ SOLN: 0.2500 mg | INTRAMUSCULAR | Status: DC | PRN

## 2020-05-08 MED ORDER — CEFAZOLIN SODIUM-DEXTROSE 2-4 GM/100ML-% IV SOLN
2.0000 g | Freq: Three times a day (TID) | INTRAVENOUS | Status: AC
  Administered 2020-05-08 – 2020-05-09 (×2): 2 g via INTRAVENOUS
  Filled 2020-05-08 (×2): qty 100

## 2020-05-08 MED ORDER — LIDOCAINE 2% (20 MG/ML) 5 ML SYRINGE
INTRAMUSCULAR | Status: AC
  Filled 2020-05-08: qty 5

## 2020-05-08 MED ORDER — HYDROCODONE-ACETAMINOPHEN 5-325 MG PO TABS
2.0000 | ORAL_TABLET | ORAL | Status: DC | PRN
Start: 2020-05-08 — End: 2020-05-09
  Administered 2020-05-08 – 2020-05-09 (×5): 2 via ORAL
  Filled 2020-05-08 (×6): qty 2

## 2020-05-08 MED ORDER — THROMBIN 20000 UNITS EX SOLR
CUTANEOUS | Status: DC | PRN
  Administered 2020-05-08: 20000 [IU] via TOPICAL

## 2020-05-08 MED ORDER — SODIUM CHLORIDE 0.9 % IV SOLN: 250.0000 mL | INTRAVENOUS | Status: DC

## 2020-05-08 MED ORDER — ONDANSETRON HCL 4 MG/2ML IJ SOLN
INTRAMUSCULAR | Status: AC
  Filled 2020-05-08: qty 2

## 2020-05-08 MED ORDER — PANTOPRAZOLE SODIUM 40 MG IV SOLR
40.0000 mg | Freq: Every day | INTRAVENOUS | Status: DC
  Administered 2020-05-08: 40 mg via INTRAVENOUS
  Filled 2020-05-08: qty 40

## 2020-05-08 MED ORDER — HYDROCODONE-ACETAMINOPHEN 5-325 MG PO TABS
1.0000 | ORAL_TABLET | ORAL | Status: DC | PRN
Start: 2020-05-08 — End: 2020-05-09
  Administered 2020-05-09: 1 via ORAL

## 2020-05-08 MED ORDER — SODIUM CHLORIDE 0.9% FLUSH
3.0000 mL | Freq: Two times a day (BID) | INTRAVENOUS | Status: DC
  Administered 2020-05-08 (×2): 3 mL via INTRAVENOUS

## 2020-05-08 MED ORDER — PHENOL 1.4 % MT LIQD
1.0000 | OROMUCOSAL | Status: DC | PRN
  Filled 2020-05-08: qty 177

## 2020-05-08 MED ORDER — MIDAZOLAM HCL 2 MG/2ML IJ SOLN
INTRAMUSCULAR | Status: AC
  Filled 2020-05-08: qty 2

## 2020-05-08 MED ORDER — CHLORHEXIDINE GLUCONATE 0.12 % MT SOLN
OROMUCOSAL | Status: AC
  Administered 2020-05-08: 15 mL
  Filled 2020-05-08: qty 15

## 2020-05-08 MED ORDER — BISACODYL 5 MG PO TBEC: 5.0000 mg | DELAYED_RELEASE_TABLET | Freq: Every day | ORAL | Status: DC | PRN

## 2020-05-08 MED ORDER — ACETAMINOPHEN 325 MG PO TABS: 650.0000 mg | ORAL_TABLET | ORAL | Status: DC | PRN

## 2020-05-08 MED ORDER — OXYCODONE HCL 5 MG/5ML PO SOLN
5.0000 mg | Freq: Once | ORAL | Status: DC | PRN
Start: 2020-05-08 — End: 2020-05-08

## 2020-05-08 MED ORDER — PROMETHAZINE HCL 25 MG/ML IJ SOLN: 6.2500 mg | INTRAMUSCULAR | Status: DC | PRN

## 2020-05-08 MED ORDER — POVIDONE-IODINE 7.5 % EX SOLN: Freq: Once | CUTANEOUS | Status: DC

## 2020-05-08 MED ORDER — MENTHOL 3 MG MT LOZG
1.0000 | LOZENGE | OROMUCOSAL | Status: DC | PRN
  Administered 2020-05-08: 3 mg via ORAL
  Filled 2020-05-08: qty 9

## 2020-05-08 MED ORDER — PHENYLEPHRINE 40 MCG/ML (10ML) SYRINGE FOR IV PUSH (FOR BLOOD PRESSURE SUPPORT)
PREFILLED_SYRINGE | INTRAVENOUS | Status: AC
  Filled 2020-05-08: qty 10

## 2020-05-08 MED ORDER — SODIUM CHLORIDE 0.9% FLUSH: 3.0000 mL | INTRAVENOUS | Status: DC | PRN

## 2020-05-08 MED ORDER — SODIUM CHLORIDE 0.9 % IR SOLN
Status: DC | PRN
  Administered 2020-05-08 (×2): 1000 mL

## 2020-05-08 MED ORDER — DOCUSATE SODIUM 100 MG PO CAPS
100.0000 mg | ORAL_CAPSULE | Freq: Two times a day (BID) | ORAL | Status: DC
  Administered 2020-05-08 – 2020-05-09 (×2): 100 mg via ORAL
  Filled 2020-05-08 (×2): qty 1

## 2020-05-08 MED ORDER — SERTRALINE HCL 50 MG PO TABS
150.0000 mg | ORAL_TABLET | Freq: Every day | ORAL | Status: DC
  Administered 2020-05-08: 150 mg via ORAL
  Filled 2020-05-08: qty 3

## 2020-05-08 MED ORDER — ACETAMINOPHEN 650 MG RE SUPP: 650.0000 mg | RECTAL | Status: DC | PRN

## 2020-05-08 MED ORDER — FLEET ENEMA 7-19 GM/118ML RE ENEM: 1.0000 | ENEMA | Freq: Once | RECTAL | Status: DC | PRN

## 2020-05-08 MED ORDER — ONDANSETRON HCL 4 MG PO TABS: 4.0000 mg | ORAL_TABLET | Freq: Four times a day (QID) | ORAL | Status: DC | PRN

## 2020-05-08 MED ORDER — CEFAZOLIN SODIUM-DEXTROSE 2-4 GM/100ML-% IV SOLN
2.0000 g | INTRAVENOUS | Status: AC
  Administered 2020-05-08: 2 g via INTRAVENOUS
  Filled 2020-05-08: qty 100

## 2020-05-08 MED ORDER — LACTATED RINGERS IV SOLN: INTRAVENOUS | Status: DC | PRN

## 2020-05-08 MED ORDER — FENTANYL CITRATE (PF) 250 MCG/5ML IJ SOLN
INTRAMUSCULAR | Status: AC
  Filled 2020-05-08: qty 5

## 2020-05-08 MED ORDER — ZOLPIDEM TARTRATE 5 MG PO TABS: 5.0000 mg | ORAL_TABLET | Freq: Every evening | ORAL | Status: DC | PRN

## 2020-05-08 MED ORDER — MIDAZOLAM HCL 2 MG/2ML IJ SOLN
INTRAMUSCULAR | Status: DC | PRN
  Administered 2020-05-08: 2 mg via INTRAVENOUS

## 2020-05-08 MED ORDER — PHENYLEPHRINE 40 MCG/ML (10ML) SYRINGE FOR IV PUSH (FOR BLOOD PRESSURE SUPPORT)
PREFILLED_SYRINGE | INTRAVENOUS | Status: DC | PRN
  Administered 2020-05-08: 40 ug via INTRAVENOUS
  Administered 2020-05-08 (×2): 80 ug via INTRAVENOUS
  Administered 2020-05-08 (×2): 40 ug via INTRAVENOUS
  Administered 2020-05-08 (×2): 80 ug via INTRAVENOUS

## 2020-05-08 MED ORDER — DIPHENHYDRAMINE HCL 25 MG PO TABS: 50.0000 mg | ORAL_TABLET | Freq: Four times a day (QID) | ORAL | Status: DC | PRN

## 2020-05-08 MED ORDER — BUPIVACAINE-EPINEPHRINE (PF) 0.25% -1:200000 IJ SOLN
INTRAMUSCULAR | Status: AC
  Filled 2020-05-08: qty 30

## 2020-05-08 MED ORDER — ROCURONIUM BROMIDE 10 MG/ML (PF) SYRINGE
PREFILLED_SYRINGE | INTRAVENOUS | Status: DC | PRN
  Administered 2020-05-08: 15 mg via INTRAVENOUS
  Administered 2020-05-08: 60 mg via INTRAVENOUS

## 2020-05-08 MED ORDER — ROCURONIUM BROMIDE 10 MG/ML (PF) SYRINGE
PREFILLED_SYRINGE | INTRAVENOUS | Status: AC
  Filled 2020-05-08: qty 10

## 2020-05-08 MED ORDER — METHOCARBAMOL 500 MG PO TABS
ORAL_TABLET | ORAL | Status: AC
  Filled 2020-05-08: qty 1

## 2020-05-08 MED ORDER — OXYCODONE HCL 5 MG PO TABS: 5.0000 mg | ORAL_TABLET | Freq: Once | ORAL | Status: DC | PRN

## 2020-05-08 MED ORDER — PROPOFOL 10 MG/ML IV BOLUS
INTRAVENOUS | Status: AC
  Filled 2020-05-08: qty 20

## 2020-05-08 MED ORDER — POLYETHYLENE GLYCOL 3350 17 G PO PACK: 17.0000 g | PACK | Freq: Every day | ORAL | Status: DC | PRN

## 2020-05-08 MED ORDER — THROMBIN 20000 UNITS EX KIT
PACK | CUTANEOUS | Status: AC
  Filled 2020-05-08: qty 1

## 2020-05-08 MED ORDER — PROPOFOL 10 MG/ML IV BOLUS
INTRAVENOUS | Status: DC | PRN
  Administered 2020-05-08: 130 mg via INTRAVENOUS

## 2020-05-08 SURGICAL SUPPLY — 72 items
BENZOIN TINCTURE PRP APPL 2/3 (GAUZE/BANDAGES/DRESSINGS) ×2 IMPLANT
BIT DRILL NEURO 2X3.1 SFT TUCH (MISCELLANEOUS) ×1 IMPLANT
BIT DRILL SRG 14X2.2XFLT CHK (BIT) ×1 IMPLANT
BIT DRL SRG 14X2.2XFLT CHK (BIT) ×1
BLADE CLIPPER SURG (BLADE) IMPLANT
BLADE SURG 15 STRL LF DISP TIS (BLADE) IMPLANT
BLADE SURG 15 STRL SS (BLADE)
BONE VIVIGEN FORMABLE 1.3CC (Bone Implant) ×2 IMPLANT
CARTRIDGE OIL MAESTRO DRILL (MISCELLANEOUS) ×1 IMPLANT
CORD BIPOLAR FORCEPS 12FT (ELECTRODE) ×2 IMPLANT
COVER SURGICAL LIGHT HANDLE (MISCELLANEOUS) ×2 IMPLANT
DIFFUSER DRILL AIR PNEUMATIC (MISCELLANEOUS) ×2 IMPLANT
DRAIN JACKSON RD 7FR 3/32 (WOUND CARE) IMPLANT
DRAPE C-ARM 42X72 X-RAY (DRAPES) ×2 IMPLANT
DRAPE POUCH INSTRU U-SHP 10X18 (DRAPES) ×2 IMPLANT
DRAPE SURG 17X23 STRL (DRAPES) ×6 IMPLANT
DRILL BIT SKYLINE 14MM (BIT) ×2
DRILL NEURO 2X3.1 SOFT TOUCH (MISCELLANEOUS) ×2
DURAPREP 26ML APPLICATOR (WOUND CARE) ×2 IMPLANT
ELECT COATED BLADE 2.86 ST (ELECTRODE) ×2 IMPLANT
ELECT REM PT RETURN 9FT ADLT (ELECTROSURGICAL) ×2
ELECTRODE REM PT RTRN 9FT ADLT (ELECTROSURGICAL) ×1 IMPLANT
EVACUATOR SILICONE 100CC (DRAIN) IMPLANT
GAUZE 4X4 16PLY RFD (DISPOSABLE) ×2 IMPLANT
GAUZE SPONGE 4X4 12PLY STRL (GAUZE/BANDAGES/DRESSINGS) ×2 IMPLANT
GLOVE BIO SURGEON STRL SZ7 (GLOVE) ×2 IMPLANT
GLOVE BIO SURGEON STRL SZ8 (GLOVE) ×2 IMPLANT
GLOVE SRG 8 PF TXTR STRL LF DI (GLOVE) ×1 IMPLANT
GLOVE SURG UNDER POLY LF SZ7 (GLOVE) ×4 IMPLANT
GLOVE SURG UNDER POLY LF SZ8 (GLOVE) ×2
GOWN STRL REUS W/ TWL LRG LVL3 (GOWN DISPOSABLE) ×1 IMPLANT
GOWN STRL REUS W/ TWL XL LVL3 (GOWN DISPOSABLE) ×1 IMPLANT
GOWN STRL REUS W/TWL LRG LVL3 (GOWN DISPOSABLE) ×2
GOWN STRL REUS W/TWL XL LVL3 (GOWN DISPOSABLE) ×2
INTERLOCK LRDTC CRVCL VBR 6MM (Bone Implant) ×1 IMPLANT
IV CATH 14GX2 1/4 (CATHETERS) ×2 IMPLANT
KIT BASIN OR (CUSTOM PROCEDURE TRAY) ×2 IMPLANT
KIT TURNOVER KIT B (KITS) ×2 IMPLANT
LORDOTIC CERVICAL VBR 6MM SM (Bone Implant) ×2 IMPLANT
MANIFOLD NEPTUNE II (INSTRUMENTS) ×2 IMPLANT
NEEDLE PRECISIONGLIDE 27X1.5 (NEEDLE) ×2 IMPLANT
NEEDLE SPNL 20GX3.5 QUINCKE YW (NEEDLE) ×2 IMPLANT
NS IRRIG 1000ML POUR BTL (IV SOLUTION) ×2 IMPLANT
OIL CARTRIDGE MAESTRO DRILL (MISCELLANEOUS) ×2
PACK ORTHO CERVICAL (CUSTOM PROCEDURE TRAY) ×2 IMPLANT
PAD ARMBOARD 7.5X6 YLW CONV (MISCELLANEOUS) ×4 IMPLANT
PATTIES SURGICAL .5 X.5 (GAUZE/BANDAGES/DRESSINGS) ×2 IMPLANT
PATTIES SURGICAL .5 X1 (DISPOSABLE) IMPLANT
PIN DISTRACTION 14 (PIN) ×4 IMPLANT
PLATE SKYLINE 12MM (Plate) ×2 IMPLANT
POSITIONER HEAD DONUT 9IN (MISCELLANEOUS) ×2 IMPLANT
RASP 3.0MM (RASP) ×2 IMPLANT
SCREW SKYLINE VAR OS 14MM (Screw) ×8 IMPLANT
SOL ANTI FOG 6CC (MISCELLANEOUS) ×1 IMPLANT
SOLUTION ANTI FOG 6CC (MISCELLANEOUS) ×1
SPONGE INTESTINAL PEANUT (DISPOSABLE) ×8 IMPLANT
SPONGE SURGIFOAM ABS GEL 100 (HEMOSTASIS) IMPLANT
STRIP CLOSURE SKIN 1/2X4 (GAUZE/BANDAGES/DRESSINGS) ×2 IMPLANT
SURGIFLO W/THROMBIN 8M KIT (HEMOSTASIS) IMPLANT
SUT MNCRL AB 4-0 PS2 18 (SUTURE) ×2 IMPLANT
SUT SILK 4 0 (SUTURE)
SUT SILK 4-0 18XBRD TIE 12 (SUTURE) IMPLANT
SUT VIC AB 2-0 CT2 18 VCP726D (SUTURE) ×2 IMPLANT
SYR BULB IRRIG 60ML STRL (SYRINGE) ×2 IMPLANT
SYR CONTROL 10ML LL (SYRINGE) ×4 IMPLANT
TAPE CLOTH 4X10 WHT NS (GAUZE/BANDAGES/DRESSINGS) IMPLANT
TAPE CLOTH SURG 6X10 WHT LF (GAUZE/BANDAGES/DRESSINGS) ×2 IMPLANT
TAPE UMBILICAL COTTON 1/8X30 (MISCELLANEOUS) ×2 IMPLANT
TOWEL GREEN STERILE (TOWEL DISPOSABLE) ×2 IMPLANT
TOWEL GREEN STERILE FF (TOWEL DISPOSABLE) ×2 IMPLANT
WATER STERILE IRR 1000ML POUR (IV SOLUTION) IMPLANT
YANKAUER SUCT BULB TIP NO VENT (SUCTIONS) ×2 IMPLANT

## 2020-05-08 NOTE — H&P (Signed)
PREOPERATIVE H&P  Chief Complaint: Left arm pain  HPI: [name] is a 45 y.o. female who presents with ongoing pain in the let arm  MRI reveals NF stenosis on the left at C6/7  Patient has failed multiple forms of conservative care and continues to have pain (see office notes for additional details regarding the patient's full course of treatment)  Past Medical History:  Diagnosis Date  . Anxiety   . Depression   . GERD (gastroesophageal reflux disease)   . Headache   . 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 Synovitis of wrist    RIGHT WRIST  . Tear of triangular fibrocartilage complex (TFCC)    RIGHT WRIST   Past Surgical History:  Procedure Laterality Date  . ABDOMINAL HYSTERECTOMY    . ANTERIOR CERVICAL DECOMPRESSION/DISCECTOMY FUSION 4 LEVELS N/A 08/18/2018   Procedure: ANTERIOR CERVICAL DECOMPRESSION FUSION, CERVICAL THREE TO FOUR, CERVICAL FOUR TO FIVE, CERVICAL FIVE TO SIX WITH INSTRUMENTATION AND ALLOGRAFT.;  Surgeon: Phylliss Bob, MD;  Location: [facility];  Service: Orthopedics;  Laterality: N/A;  . BENIGN BREAST BX  1993  . BREAST SURGERY    . CESAREAN SECTION  2001  & 2003   2003  W/  BILATERAL TUBAL LIGATION  . CESAREAN SECTION N/A    Phreesia 06/27/2019  . CYSTO/  RIGHT RETROGRADE PYELOGRAM/  RIGHT URETERAL STENT PLACMENT   03-01-2002  . CYSTO/  STENT REMOVAL/  RIGHT ACCUSIZE RETROGRADE URETEROPELVIC JUNCTION INCISION AND STENT REPLACEMENT  12-05-2001  . EYE SURGERY    . HERNIA REPAIR    . LAPAROSCOPIC ASSISTED VAGINAL HYSTERECTOMY  2014  . LEEP/  D & C HYSTEROSCOPY/  NOVASURE ENDOMETRIAL ABLATION  08-23-2006  . REPAIR LEFT TEAR DUCT INJURY  2013  . SPINE SURGERY N/A    Phreesia 06/27/2019  . TOTAL NEPHRECTOMY Right 04-12-2002  . TUBAL LIGATION    . UMBILICAL  HERNIA REPAIR  12-11-2008   LAPAROSCOPIC  . WRIST ARTHROSCOPY Right 09/19/2014   Procedure: RIGHT WRIST ARTHROSCOPY AND TFCC ((TRIANGULAR FIBROCARTILAGE COMPLEX) DEBRIDEMENT ;  Surgeon: Iran Planas, MD;  Location: [facility];  Service: Orthopedics;  Laterality: Right;   Social History   Socioeconomic History  . Marital status: Married    Spouse name: [name]  . Number of children: 2  . Years of education: college  . Highest education level: Not on file  Occupational History  . Occupation: Oceanographer    Comment: [employer]  Tobacco Use  . Smoking status: Never Smoker  . Smokeless tobacco: Never Used  Vaping Use  . Vaping Use: Never used  Substance and Sexual Activity  . Alcohol use: No    Alcohol/week: 0.0 standard drinks    Comment: 2x year  . Drug use: No  . Sexual activity: Yes    Partners: Male    Birth control/protection: Surgical    Comment: Hysterectomy  Other Topics Concern  . Not on file  Social History Narrative   Degree in Social Work from [employer]. Working on [employer] in Clinical biochemist.   Lives with her husband and their two sons.   Social Determinants of Health   Financial Resource Strain: Not on file  Food Insecurity: Not on file  Transportation Needs: Not on file  Physical Activity: Not on file  Stress: Not on file  Social Connections: Not on file   Family History  Problem Relation Age of Onset  . Diabetes Father   . Asthma Brother   . Cancer Maternal Grandmother        pancreatic  . Mental illness Mother        anxiety, Bipolar disorder  . Asthma Son   . Diabetes Brother   . Hyperlipidemia Brother   . Asthma Son    No Known Allergies Prior to Admission medications   Medication Sig Start Date End Date Taking? Authorizing Provider  acetaminophen (TYLENOL) 500 MG tablet Take 1,000 mg by mouth 2 (two) times daily.   Yes [provider]  busPIRone (BUSPAR) 10 MG tablet TAKE 1 TABLET BY  MOUTH THREE TIMES A DAY Patient taking differently: Take 10 mg by mouth 3 (three) times daily. 11/03/19  Yes Jacelyn Pi, Lilia Argue, MD  Calcium Carbonate-Simethicone (ALKA-SELTZER HEARTBURN + GAS) 750-80 MG CHEW Chew 2 tablets by mouth daily as needed (acid reflux).   Yes [provider]  diphenhydrAMINE (BENADRYL) 25 MG tablet Take 50 mg by mouth every 6 (six) hours as needed for allergies.   Yes [provider]  Melatonin 10 MG TABS Take 20 mg by mouth at bedtime. Gummies   Yes [provider]  polyethylene glycol (MIRALAX / GLYCOLAX) packet Take 17 g by mouth daily as needed for moderate constipation.    Yes [provider]  sertraline (ZOLOFT) 100 MG tablet Take 150 mg by mouth at bedtime. 05/22/19  Yes [provider]  neomycin-polymyxin-hydrocortisone (CORTISPORIN) OTIC solution Apply 1-2 drops to toe after soaking BID Patient not taking: Reported on 04/29/2020 06/19/19   Wallene Huh, DPM     All other systems have been reviewed and were otherwise negative with the exception of those mentioned in the HPI and as above.  Physical Exam: Vitals:   05/08/20 0658  BP: 132/68  Pulse: 95  Resp: 18  Temp: 97.9 F (36.6 C)  SpO2: 99%    Body mass index is 30.99 kg/m.  General: Alert, no acute distress Cardiovascular: No pedal edema Respiratory: No cyanosis, no use of accessory musculature Skin: No lesions in the area of chief complaint Neurologic: Sensation intact distally Psychiatric: Patient is competent for consent with normal mood and affect Lymphatic: No axillary or cervical lymphadenopathy   Assessment/Plan: LEFT ARM PAIN, MRI NOTABLE FOR NEUROFORAMINAL STENOSIS AT CERVICAL 6- CERVICAL 7 Plan for Procedure(s): ANTERIOR CERVICAL DECOMPRESSION FUSION CERVICAL 6- CERVICAL 7 WITH INSTRUMENTATION AND ALLOGRAFT   Norva Karvonen, MD 05/08/2020 7:35 AM

## 2020-05-08 NOTE — Anesthesia Procedure Notes (Addendum)
Procedure Name: Intubation Date/Time: 05/08/2020 8:36 AM Performed by: Michele Rockers, CRNA Pre-anesthesia Checklist: Patient identified, Patient being monitored, Timeout performed, Emergency Drugs available and Suction available Patient Re-evaluated:Patient Re-evaluated prior to induction Oxygen Delivery Method: Circle System Utilized Preoxygenation: Pre-oxygenation with 100% oxygen Induction Type: IV induction Ventilation: Mask ventilation without difficulty Laryngoscope Size: Miller and 2 Grade View: Grade I Tube type: Oral Tube size: 7.0 mm Number of attempts: 1 Airway Equipment and Method: Stylet Placement Confirmation: ETT inserted through vocal cords under direct vision,  positive ETCO2 and breath sounds checked- equal and bilateral Secured at: 21 cm Tube secured with: Tape Dental Injury: Teeth and Oropharynx as per pre-operative assessment

## 2020-05-08 NOTE — Anesthesia Postprocedure Evaluation (Signed)
Anesthesia Post Note  Patient: [name]  Procedure(s) Performed: ANTERIOR CERVICAL DECOMPRESSION FUSION CERVICAL 6- CERVICAL 7 WITH INSTRUMENTATION AND ALLOGRAFT (N/A Neck)     Patient location during evaluation: PACU Anesthesia Type: General Level of consciousness: awake and alert Pain management: pain level controlled Vital Signs Assessment: post-procedure vital signs reviewed and stable Respiratory status: spontaneous breathing, nonlabored ventilation, respiratory function stable and patient connected to nasal cannula oxygen Cardiovascular status: blood pressure returned to baseline and stable Postop Assessment: no apparent nausea or vomiting Anesthetic complications: no   No complications documented.  Last Vitals:  Vitals:   05/08/20 1147 05/08/20 1216  BP: 129/72 110/68  Pulse: 99 87  Resp: 13 18  Temp: (!) 36.4 C 36.4 C  SpO2: 96% 98%    Last Pain:  Vitals:   05/08/20 1330  TempSrc:   PainSc: 3                  Celine Dishman P Porfirio Bollier

## 2020-05-08 NOTE — Transfer of Care (Signed)
Immediate Anesthesia Transfer of Care Note  Patient: [name]  Procedure(s) Performed: ANTERIOR CERVICAL DECOMPRESSION FUSION CERVICAL 6- CERVICAL 7 WITH INSTRUMENTATION AND ALLOGRAFT (N/A Neck)  Patient Location: PACU  Anesthesia Type:General  Level of Consciousness: awake, patient cooperative and responds to stimulation  Airway & Oxygen Therapy: Patient Spontanous Breathing and Patient connected to nasal cannula oxygen  Post-op Assessment: Report given to RN, Post -op Vital signs reviewed and stable and Patient moving all extremities X 4  Post vital signs: Reviewed and stable  Last Vitals:  Vitals Value Taken Time  BP 148/72 05/08/20 1104  Temp    Pulse 113 05/08/20 1105  Resp 12 05/08/20 1105  SpO2 100 % 05/08/20 1105  Vitals shown include unvalidated device data.  Last Pain:  Vitals:   05/08/20 0738  PainSc: 6          Complications: No complications documented.

## 2020-05-08 NOTE — Op Note (Signed)
PATIENT NAME: [name]   MEDICAL RECORD NO.:   [id number]    DATE OF BIRTH: [birth year]   DATE OF PROCEDURE: 05/08/2020                               OPERATIVE REPORT     PREOPERATIVE DIAGNOSES: 1. Left-sided cervical radiculopathy. 2. 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 Status post previous C3-C6 ACDF with instrumentation   POSTOPERATIVE DIAGNOSES: 1. Left-sided cervical radiculopathy. 2. 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 Status post previous C3-C6 ACDF with instrumentation   PROCEDURE: 1. Complex anterior cervical decompression and fusion C6/7. 2. Placement of anterior instrumentation, C6/7. 3. Insertion of interbody device x1 (32mm Titan intervertebral spacer). 4. Intraoperative use of fluoroscopy. 5. Use of morselized allograft - ViviGen. 6. Removal of previously placed instrumentation spanning C5-6   SURGEON:  Phylliss Bob, MD   ASSISTANT:  Pricilla Holm, PA-C.   ANESTHESIA:  General endotracheal anesthesia.   COMPLICATIONS:  None.   DISPOSITION:  Stable.   ESTIMATED BLOOD LOSS:  Minimal.   INDICATIONS FOR SURGERY:  Briefly, Ms. Renner is a pleasant 45 -year- old female, who did present to me with severe pain in her neck and left arm.   As noted above, she is status post a previous anterior cervical fusion spanning C3-C6. A CAT scan did confirm successful fusion from C3-C6. The patient's MRI did reveal a prominent left C6-7 disc herniation, compressing the left hemicord and exiting left C7 nerve.  Given the patient's ongoing rather pain and lack of improvement with appropriate treatment measures, we did discuss proceeding with the procedure noted above.  The patient was fully aware of the risks and limitations of surgery as outlined in my preoperative note.   OPERATIVE DETAILS:  On 05/08/2020, the patient was brought to surgery and general endotracheal anesthesia was administered.  The patient was  placed supine on the hospital bed. The neck was gently extended.  All bony prominences were meticulously padded.  The neck was prepped and draped in the usual sterile fashion.  At this point, I did make a left-sided transverse incision, beneath the patient's previous incision.  The platysma was incised.  A Smith-Robinson approach was used and the anterior spine was identified. Of note, identifying the anterior spine was rather meticulous, as there were extensive adhesions throughout the anterior spinal region.  Developing a safe plane medial to the carotid artery, and lateral to the esophagus, was rather time-consuming and meticulous, however, I was able to identify a safe plane, and the anterior spine was readily noted.  Approach in the anterior spine did take approximately 30 minutes longer than normal.  At this point, the previously placed anterior plate was identified.  A self-retaining retractor was placed. At this point, I did elect to use a metal cutting bur, and remove the inferior aspect of the previously identified anterior cervical plate.  Prior to this, the screws into the C6 vertebral body were removed.   Using the metal cutting bur, I did amputate the lower aspect of the plate at the level of the C5-6 intervertebral space. I did liberally irrigate while using the bur, in order to minimize metallic debris.  Once the lower plate was removed, the C6 and C7 vertebral bodies were subperiosteally exposed. Caspar pins were then placed into the C6 and C7 vertebral bodies and distraction was applied.  A thorough and complete C6-7 intervertebral  diskectomy was performed.  The posterior longitudinal ligament was identified and entered using a nerve hook.  I then used #1 followed by #2 Kerrison to perform a thorough and complete intervertebral diskectomy.  The spinal canal was thoroughly decompressed, as was the left neuroforamen.  The endplates were then prepared and the appropriate-sized  intervertebral spacer was then packed with ViviGen and tamped into position in the usual fashion. The Caspar pins  then were removed and bone wax was placed in their place.  The appropriate-sized anterior cervical plate was placed over the anterior spine.  14 mm variable angle screws were placed, 2 in each vertebral body from C6-C7 for a total of 4 vertebral body screws.  The screws were then locked to the plate using the Cam locking mechanism.  I was very pleased with the final fluoroscopic images.  The wound was then irrigated.  The wound was then explored for any undue bleeding and there was no bleeding noted. The wound was then closed in layers using 2-0 Vicryl, followed by 4-0 Monocryl.  Benzoin and Steri-Strips were applied, followed by sterile dressing.  All instrument counts were correct at the termination of the procedure.   Of note, Pricilla Holm, PA-C, was my assistant throughout surgery, and did aid in retraction, placement of the hardware, suctioning, and closure from start to finish.       Phylliss Bob, MD

## 2020-05-09 ENCOUNTER — Encounter (HOSPITAL_COMMUNITY): Payer: Self-pay | Admitting: Orthopedic Surgery

## 2020-05-09 DIAGNOSIS — M5412 Radiculopathy, cervical region: Secondary | ICD-10-CM | POA: Diagnosis not present

## 2020-05-09 MED ORDER — METHOCARBAMOL 500 MG PO TABS: 500.0000 mg | ORAL_TABLET | Freq: Four times a day (QID) | ORAL | 1 refills | Status: DC | PRN

## 2020-05-09 MED ORDER — HYDROCODONE-ACETAMINOPHEN 5-325 MG PO TABS: 1.0000 | ORAL_TABLET | ORAL | 0 refills | Status: DC | PRN

## 2020-05-09 MED FILL — Thrombin For Soln Kit 20000 Unit: CUTANEOUS | Qty: 1 | Status: AC

## 2020-05-09 NOTE — Progress Notes (Signed)
Patient was transported via wheelchair by volunteer for discharge home; in no acute distress nor complaints of pain nor discomfort; all belongings taken along with her; discharge instructions given to patient by RN and she verbalized understanding.

## 2020-05-09 NOTE — Progress Notes (Signed)
    Patient doing well  Denies arm pain Tolerating PO well   Physical Exam: Vitals:   05/09/20 0400 05/09/20 0438  BP: (!) 97/58 117/73  Pulse: 66 71  Resp: 20 20  Temp: 97.7 F (36.5 C) 97.8 F (36.6 C)  SpO2: 99% 99%    Neck soft/supple Dressing in place NVI  POD #1 s/p ACDF, doing well  - encourage ambulation - Norco for pain, Robaxin for muscle spasms - d/c home today with f/u in 2 weeks

## 2020-05-16 NOTE — Discharge Summary (Signed)
Patient ID: [name] MRN: [id number] DOB/AGE: 03-17-1975 45 y.o.  Admit date: 05/08/2020 Discharge date:   Admission Diagnoses:  Active Problems:   Radiculopathy   Discharge Diagnoses:  Same  Past Medical History:  Diagnosis Date  . Anxiety   . Depression   . GERD (gastroesophageal reflux disease)   . Headache   . 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 Synovitis of wrist    RIGHT WRIST  . Tear of triangular fibrocartilage complex (TFCC)    RIGHT WRIST    Surgeries: Procedure(s): ANTERIOR CERVICAL DECOMPRESSION FUSION CERVICAL 6- CERVICAL 7 WITH INSTRUMENTATION AND ALLOGRAFT on 05/08/2020   Consultants:  None Discharged Condition: Improved  Hospital Course: [name] is an 45 y.o. female who was admitted 05/08/2020 for operative treatment of Radiculopathy. Patient has severe unremitting pain that affects sleep, daily activities, and work/hobbies. After pre-op clearance the patient was taken to the operating room on 05/08/2020 and underwent  Procedure(s): ANTERIOR CERVICAL DECOMPRESSION FUSION CERVICAL 6- CERVICAL 7 WITH INSTRUMENTATION AND ALLOGRAFT.    Patient was given perioperative antibiotics:  Anti-infectives (From admission, onward)   Start     Dose/Rate Route Frequency Ordered Stop   05/08/20 1700  ceFAZolin (ANCEF) IVPB 2g/100 mL premix        2 g 200 mL/hr over 30 Minutes Intravenous Every 8 hours 05/08/20 1208 05/09/20 0030   05/08/20 0730  ceFAZolin (ANCEF) IVPB 2g/100 mL premix        2 g 200 mL/hr over 30 Minutes Intravenous On call to O.R. 05/08/20 [id number] 05/08/20 0840       Patient was given sequential compression devices, early ambulation to prevent DVT.  Patient benefited maximally from hospital stay and there were no complications.    Recent  vital signs: BP 115/65 (BP Location: Left Arm)   Pulse 73   Temp 97.7 F (36.5 C) (Oral)   Resp 16   Ht 5\' 1"  (1.549 m)   Wt 74.4 kg   LMP  (LMP Unknown)   SpO2 99%   BMI 30.99 kg/m    Discharge Medications:   Allergies as of 05/09/2020   No Known Allergies     Medication List    TAKE these medications   acetaminophen 500 MG tablet Commonly known as: TYLENOL Take 1,000 mg by mouth 2 (two) times daily.   Alka-Seltzer Heartburn + Gas 750-80 MG Chew Generic drug: Calcium Carbonate-Simethicone Chew 2 tablets by mouth daily as needed (acid reflux).   busPIRone 10 MG tablet Commonly known as: BUSPAR TAKE 1 TABLET BY MOUTH THREE TIMES A DAY   diphenhydrAMINE 25 MG tablet Commonly known as: BENADRYL Take 50 mg by mouth every 6 (six) hours as needed for allergies.   HYDROcodone-acetaminophen 5-325 MG tablet Commonly known as: NORCO/VICODIN Take 1 tablet by mouth every 4 (four) hours as needed for moderate pain ((score 4 to 6)).   Melatonin 10 MG Tabs Take 20 mg by mouth at bedtime. Gummies   methocarbamol 500 MG tablet Commonly known as: ROBAXIN Take 1 tablet (500 mg total) by mouth every 6 (six) hours as needed for muscle spasms.   neomycin-polymyxin-hydrocortisone OTIC solution Commonly known as: CORTISPORIN Apply 1-2 drops to toe after soaking BID   polyethylene glycol 17 g packet Commonly known as: MIRALAX / GLYCOLAX Take 17  g by mouth daily as needed for moderate constipation.   sertraline 100 MG tablet Commonly known as: ZOLOFT Take 150 mg by mouth at bedtime.       Diagnostic Studies: DG Cervical Spine 1 View  Result Date: 05/08/2020 CLINICAL DATA:  Surgery, elective. Additional history provided: Anterior cervical decompression fusion cervical 6-cervical 7 with instrumentation and allograft. Provided fluoroscopy time 7.9 seconds (6.89 mGy). EXAM: DG CERVICAL SPINE - 1 VIEW; DG C-ARM 1-60 MIN COMPARISON:  Cervical spine MRI 03/31/2019. CT cervical spine  03/31/2019. FINDINGS: A single lateral view intraoperative fluoroscopic image of the cervical spine is submitted. The image demonstrates new ACDF hardware at C6-C7 (ventral plate and screws as well as interbody device). ACDF hardware more cephalad within the cervical spine at the C3-C5 levels. Redemonstrated C5-C6 interbody device. IMPRESSION: Single lateral view intraoperative fluoroscopic image of the cervical spine from C6-C7 ACDF, as described. Electronically Signed   By: Kellie Simmering DO   On: 05/08/2020 11:30   DG C-Arm 1-60 Min  Result Date: 05/08/2020 CLINICAL DATA:  Surgery, elective. Additional history provided: Anterior cervical decompression fusion cervical 6-cervical 7 with instrumentation and allograft. Provided fluoroscopy time 7.9 seconds (6.89 mGy). EXAM: DG CERVICAL SPINE - 1 VIEW; DG C-ARM 1-60 MIN COMPARISON:  Cervical spine MRI 03/31/2019. CT cervical spine 03/31/2019. FINDINGS: A single lateral view intraoperative fluoroscopic image of the cervical spine is submitted. The image demonstrates new ACDF hardware at C6-C7 (ventral plate and screws as well as interbody device). ACDF hardware more cephalad within the cervical spine at the C3-C5 levels. Redemonstrated C5-C6 interbody device. IMPRESSION: Single lateral view intraoperative fluoroscopic image of the cervical spine from C6-C7 ACDF, as described. Electronically Signed   By: Kellie Simmering DO   On: 05/08/2020 11:30    Disposition: Discharge disposition: 01-Home or Self Care        POD #1 s/p ACDF, doing well  - encourage ambulation - Norco for pain, Robaxin for muscle spasms -Scripts for pain sent to pharmacy electronically  -D/C instructions sheet printed and in chart -D/C today  -F/U in office 2 weeks   Signed: Lennie Muckle Webb Lin 05/16/2020, 11:28 AM

## 2020-08-13 ENCOUNTER — Telehealth: Payer: BC Managed Care – PPO | Admitting: Emergency Medicine

## 2020-08-13 DIAGNOSIS — Z760 Encounter for issue of repeat prescription: Secondary | ICD-10-CM

## 2020-08-13 MED ORDER — BUSPIRONE HCL 10 MG PO TABS: 10.0000 mg | ORAL_TABLET | Freq: Three times a day (TID) | ORAL | 0 refills | Status: DC

## 2020-08-13 NOTE — Progress Notes (Signed)
Virtual Visit Consent   [name], you are scheduled for a virtual visit with a [facility] provider today.     Just as with appointments in the office, your consent must be obtained to participate.  Your consent will be active for this visit and any virtual visit you may have with one of our providers in the next 365 days.     If you have a MyChart account, a copy of this consent can be sent to you electronically.  All virtual visits are billed to your insurance company just like a traditional visit in the office.    As this is a virtual visit, video technology does not allow for your provider to perform a traditional examination.  This may limit your provider's ability to fully assess your condition.  If your provider identifies any concerns that need to be evaluated in person or the need to arrange testing (such as labs, EKG, etc.), we will make arrangements to do so.     Although advances in technology are sophisticated, we cannot ensure that it will always work on either your end or our end.  If the connection with a video visit is poor, the visit may have to be switched to a telephone visit.  With either a video or telephone visit, we are not always able to ensure that we have a secure connection.     I need to obtain your verbal consent now.   Are you willing to proceed with your visit today?    [name] has provided verbal consent on 08/13/2020 for a virtual visit video.   Montine Circle, PA-C   Date: 08/13/2020 12:20 PM   Virtual Visit via Video Note   I, Montine Circle, connected with [name] (Mar 13, 1975) on 08/13/20 at 12:30 PM EDT by a video-enabled telemedicine application and verified that I am speaking with the correct person using two identifiers.  Location: Patient: Virtual Visit Location Patient: Home Provider: Virtual Visit Location Provider: Home Office   I discussed the limitations of evaluation and management by telemedicine and the  availability of in person appointments. The patient expressed understanding and agreed to proceed.    History of Present Illness: [name] is a 45 y.o. who identifies as a female who was assigned female at birth, and is being seen today for CC of needing refill of Buspar.  States that she can't get in touch with her doctor.  Took last pill 3 days ago.  Denies any other complaints.[name]  HPI: HPI  Problems:  Patient Active Problem List   Diagnosis Date Noted   Radiculopathy 08/18/2018   Wrist pain, chronic 04/12/2014   Anxiety and depression 03/22/2014   Strain of right inguinal muscle 07/06/2013   Left eye injury 10/29/2010    Allergies: No Known Allergies Medications:  Current Outpatient Medications:    acetaminophen (TYLENOL) 500 MG tablet, Take 1,000 mg by mouth 2 (two) times daily., Disp: , Rfl:    busPIRone (BUSPAR) 10 MG tablet, TAKE 1 TABLET BY MOUTH THREE TIMES A DAY (Patient taking differently: Take 10 mg by mouth 3 (three) times daily.), Disp: 270 tablet, Rfl: 0   Calcium Carbonate-Simethicone (ALKA-SELTZER HEARTBURN + GAS) 750-80 MG CHEW, Chew 2 tablets by mouth daily as needed (acid reflux)., Disp: , Rfl:    diphenhydrAMINE (BENADRYL) 25 MG tablet, Take 50 mg by mouth every 6 (six) hours as needed for allergies., Disp: , Rfl:    HYDROcodone-acetaminophen (NORCO/VICODIN) 5-325 MG tablet, Take  1 tablet by mouth every 4 (four) hours as needed for moderate pain ((score 4 to 6))., Disp: 30 tablet, Rfl: 0   Melatonin 10 MG TABS, Take 20 mg by mouth at bedtime. Gummies, Disp: , Rfl:    methocarbamol (ROBAXIN) 500 MG tablet, Take 1 tablet (500 mg total) by mouth every 6 (six) hours as needed for muscle spasms., Disp: 30 tablet, Rfl: 1   neomycin-polymyxin-hydrocortisone (CORTISPORIN) OTIC solution, Apply 1-2 drops to toe after soaking BID (Patient not taking: Reported on 04/29/2020), Disp: 10 mL, Rfl: 1   polyethylene glycol (MIRALAX / GLYCOLAX) packet, Take 17 g by mouth daily  as needed for moderate constipation. , Disp: , Rfl:    sertraline (ZOLOFT) 100 MG tablet, Take 150 mg by mouth at bedtime., Disp: , Rfl:   Observations/Objective: Patient is well-developed, well-nourished in no acute distress.  Resting comfortably at home.  Head is normocephalic, atraumatic.  No labored breathing.  Speech is clear and coherent with logical content.  Patient is alert and oriented at baseline.    Assessment and Plan: 1. Medication refill -Given 2 weeks supply of Buspar so that patient can get in contact with her doctor.  Follow Up Instructions: I discussed the assessment and treatment plan with the patient. The patient was provided an opportunity to ask questions and all were answered. The patient agreed with the plan and demonstrated an understanding of the instructions.  A copy of instructions were sent to the patient via MyChart.  The patient was advised to call back or seek an in-person evaluation if the symptoms worsen or if the condition fails to improve as anticipated.  Time:  I spent 10 minutes with the patient via telehealth technology discussing the above problems/concerns.    Montine Circle, PA-C

## 2021-02-24 ENCOUNTER — Other Ambulatory Visit: Payer: Self-pay | Admitting: Orthopedic Surgery

## 2021-02-24 DIAGNOSIS — M545 Low back pain, unspecified: Secondary | ICD-10-CM

## 2021-02-24 DIAGNOSIS — G8929 Other chronic pain: Secondary | ICD-10-CM

## 2021-03-18 ENCOUNTER — Ambulatory Visit
Admission: RE | Admit: 2021-03-18 | Discharge: 2021-03-18 | Disposition: A | Discharge status: Home / Self Care | Payer: BC Managed Care – PPO | Source: Ambulatory Visit | Attending: Orthopedic Surgery | Admitting: Orthopedic Surgery

## 2021-03-18 ENCOUNTER — Other Ambulatory Visit: Payer: Self-pay

## 2021-03-18 DIAGNOSIS — M545 Low back pain, unspecified: Secondary | ICD-10-CM

## 2021-08-28 ENCOUNTER — Encounter: Payer: Self-pay | Admitting: Nurse Practitioner

## 2021-09-26 ENCOUNTER — Ambulatory Visit: Payer: BC Managed Care – PPO | Admitting: Nurse Practitioner

## 2021-10-27 ENCOUNTER — Encounter: Payer: Self-pay | Admitting: Nurse Practitioner

## 2021-10-27 ENCOUNTER — Ambulatory Visit: Payer: BC Managed Care – PPO | Admitting: Nurse Practitioner

## 2021-10-27 ENCOUNTER — Other Ambulatory Visit (INDEPENDENT_AMBULATORY_CARE_PROVIDER_SITE_OTHER): Payer: BC Managed Care – PPO

## 2021-10-27 VITALS — BP 118/80 | HR 80 | Ht 61.0 in | Wt 170.6 lb

## 2021-10-27 DIAGNOSIS — R1030 Lower abdominal pain, unspecified: Secondary | ICD-10-CM

## 2021-10-27 DIAGNOSIS — R197 Diarrhea, unspecified: Secondary | ICD-10-CM | POA: Diagnosis not present

## 2021-10-27 DIAGNOSIS — Z1211 Encounter for screening for malignant neoplasm of colon: Secondary | ICD-10-CM

## 2021-10-27 LAB — CBC WITH DIFFERENTIAL/PLATELET
Basophils Absolute: 0.1 10*3/uL (ref 0.0–0.1)
Basophils Relative: 0.8 % (ref 0.0–3.0)
Eosinophils Absolute: 0.1 10*3/uL (ref 0.0–0.7)
Eosinophils Relative: 1.2 % (ref 0.0–5.0)
HCT: 37.9 % (ref 36.0–46.0)
Hemoglobin: 12.7 g/dL (ref 12.0–15.0)
Lymphocytes Relative: 41.3 % (ref 12.0–46.0)
Lymphs Abs: 3.2 10*3/uL (ref 0.7–4.0)
MCHC: 33.5 g/dL (ref 30.0–36.0)
MCV: 92.1 fl (ref 78.0–100.0)
Monocytes Absolute: 0.5 10*3/uL (ref 0.1–1.0)
Monocytes Relative: 5.8 % (ref 3.0–12.0)
Neutro Abs: 4 10*3/uL (ref 1.4–7.7)
Neutrophils Relative %: 50.9 % (ref 43.0–77.0)
Platelets: 181 10*3/uL (ref 150.0–400.0)
RBC: 4.11 Mil/uL (ref 3.87–5.11)
RDW: 13.8 % (ref 11.5–15.5)
WBC: 7.9 10*3/uL (ref 4.0–10.5)

## 2021-10-27 LAB — HIGH SENSITIVITY CRP: CRP, High Sensitivity: 8.37 mg/L — ABNORMAL HIGH (ref 0.000–5.000)

## 2021-10-27 LAB — COMPREHENSIVE METABOLIC PANEL
ALT: 16 U/L (ref 0–35)
AST: 16 U/L (ref 0–37)
Albumin: 4.4 g/dL (ref 3.5–5.2)
Alkaline Phosphatase: 46 U/L (ref 39–117)
BUN: 9 mg/dL (ref 6–23)
CO2: 26 mEq/L (ref 19–32)
Calcium: 9.4 mg/dL (ref 8.4–10.5)
Chloride: 104 mEq/L (ref 96–112)
Creatinine, Ser: 0.94 mg/dL (ref 0.40–1.20)
GFR: 71.69 mL/min (ref >60.00)
Glucose, Bld: 83 mg/dL (ref 70–99)
Potassium: 3.8 mEq/L (ref 3.5–5.1)
Sodium: 138 mEq/L (ref 135–145)
Total Bilirubin: 0.3 mg/dL (ref 0.2–1.2)
Total Protein: 7.6 g/dL (ref 6.0–8.3)

## 2021-10-27 MED ORDER — DICYCLOMINE HCL 10 MG PO CAPS: 10.0000 mg | ORAL_CAPSULE | Freq: Three times a day (TID) | ORAL | 0 refills | Status: DC | PRN

## 2021-10-27 MED ORDER — NA SULFATE-K SULFATE-MG SULF 17.5-3.13-1.6 GM/177ML PO SOLN: 1.0000 | Freq: Once | ORAL | 0 refills | Status: AC

## 2021-10-27 NOTE — Progress Notes (Signed)
10/27/2021 [name] [id number] 03-13-1975   CHIEF COMPLAINT: Diarrhea, lower abdominal pain  HISTORY OF PRESENT ILLNESS: Shella N. Nierenberg is a 46 year old female with past medical history of anxiety, depression and GERD.  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  See additional surgical history below.  She presents to our office today as referred by Dr. Turner Daniels to schedule a screening colonoscopy. She complains of having diarrhea x 2 to 3 weeks and lower abdominal cramping for the past few months. She describes having diarrhea 1 to 2 times several days weekly.  She passed a solid stool once or twice over the past 2 weeks.  No bloody stools or melena.  A few times, she thought she passed gas but had fecal leakage.  She sometimes feels hot and clammy when having active lower abdominal pain then continues to feel weak after passing a diarrhea bowel movement.  She was prescribed an antibiotic in July and August 2023 (Amoxicillin) due to having an ear infection.  She was also diagnosed with COVID August 2023, did not require Paxlovid.  She has infrequent heartburn for which she takes Tums or Alka-Seltzer as needed.  She underwent an EGD by [facility] 04/30/2010, biopsy showed benign gastric mucosa (procedure report not found in Care everywhere). No nausea or vomiting.  Infrequent NSAID use.  No alcohol use.  No known family history of IBD or colorectal cancer.  She denies ever having a screening colonoscopy.  She denies any problems with sedation or anesthesia but stated after her cervical spine surgery she coughed up a bit of blood after the endotracheal tube was removed.   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--  s/p  right nephrectomy   Infections of other parts of urinary tract in pregnancy, first trimester    S/p nephrectomy    right due to chronic congenital upj obstruction   Synovitis of wrist    RIGHT WRIST   Tear of triangular fibrocartilage complex (TFCC)    RIGHT WRIST   Past Surgical History:  Procedure Laterality Date   ABDOMINAL HYSTERECTOMY     ANTERIOR CERVICAL DECOMP/DISCECTOMY FUSION N/A 05/08/2020   Procedure: ANTERIOR CERVICAL DECOMPRESSION FUSION CERVICAL 6- CERVICAL 7 WITH INSTRUMENTATION AND ALLOGRAFT;  Surgeon: Phylliss Bob, MD;  Location: [facility];  Service: Orthopedics;  Laterality: N/A;   ANTERIOR CERVICAL DECOMPRESSION/DISCECTOMY FUSION 4 LEVELS N/A 08/18/2018   Procedure: ANTERIOR CERVICAL DECOMPRESSION FUSION, CERVICAL THREE TO FOUR, CERVICAL FOUR TO FIVE, CERVICAL FIVE TO SIX WITH INSTRUMENTATION AND ALLOGRAFT.;  Surgeon: Phylliss Bob, MD;  Location: [facility];  Service: Orthopedics;  Laterality: N/A;   BENIGN BREAST BX  1993   BREAST SURGERY     CESAREAN SECTION  2001  & 2003   2003  W/  BILATERAL TUBAL LIGATION   CESAREAN SECTION N/A    [employer]rees[employer] 06/27/2019   CYSTO/  RIGHT RETROGRADE PYELOGRAM/  RIGHT URETERAL STENT PLACMENT   03-01-2002   CYSTO/  STENT REMOVAL/  RIGHT ACCUSIZE RETROGRADE URETEROPELVIC JUNCTION INCISION AND STENT REPLACEMENT  12-05-2001   EYE SURGERY     HERNIA REPAIR     LAPAROSCOPIC ASSISTED VAGINAL HYSTERECTOMY  2014   LEEP/  D & C HYSTEROSCOPY/  NOVASURE  ENDOMETRIAL ABLATION  08-23-2006   REPAIR LEFT TEAR DUCT INJURY  2013   SPINE SURGERY N/A    [employer]reesia 06/27/2019   TOTAL NEPHRECTOMY Right 04-12-2002   TUBAL LIGATION     UMBILICAL HERNIA REPAIR  12-11-2008   LAPAROSCOPIC   WRIST ARTHROSCOPY Right 09/19/2014   Procedure: RIGHT WRIST ARTHROSCOPY AND TFCC ((TRIANGULAR FIBROCARTILAGE COMPLEX) DEBRIDEMENT ;  Surgeon: Iran Planas, MD;  Location: [facility];  Service: Orthopedics;  Laterality: Right;    Social History: She is married.  She has 2 sons.  She is a Public relations account executive. Nonsmoker. No alcohol use. No drug use.   Family History: Mother had stomach ulcers and mental illness.  Father with diabetes.  Brother with diabetes and asthma.  No know family history of IBD or colorectal cancer.  No Known Allergies    Outpatient Encounter Medications as of 10/27/2021  Medication Sig   acetaminophen (TYLENOL) 500 MG tablet Take 1,000 mg by mouth 2 (two) times daily.   busPIRone (BUSPAR) 10 MG tablet Take 1 tablet (10 mg total) by mouth 3 (three) times daily.   Calcium Carbonate-Simethicone (ALKA-SELTZER HEARTBURN + GAS) 750-80 MG CHEW Chew 2 tablets by mouth daily as needed (acid reflux).   dicyclomine (BENTYL) 10 MG capsule Take 1 capsule (10 mg total) by mouth every 8 (eight) hours as needed for spasms.   diphenhydrAMINE (BENADRYL) 25 MG tablet Take 50 mg by mouth every 6 (six) hours as needed for allergies.   Melatonin 10 MG TABS Take 20 mg by mouth at bedtime. Gummies   Na Sulfate-K Sulfate-Mg Sulf 17.5-3.13-1.6 GM/177ML SOLN Take 1 kit by mouth once for 1 dose.   polyethylene glycol (MIRALAX / GLYCOLAX) packet Take 17 g by mouth daily as needed for moderate constipation.    sertraline (ZOLOFT) 100 MG tablet Take 150 mg by mouth at bedtime.   HYDROcodone-acetaminophen (NORCO/VICODIN) 5-325 MG tablet Take 1 tablet by mouth every 4 (four) hours as needed for moderate pain ((score 4 to 6)). (Patient not taking: Reported on 10/27/2021)   methocarbamol (ROBAXIN) 500 MG tablet Take 1 tablet (500 mg total) by mouth every 6 (six) hours as needed for muscle spasms. (Patient not taking: Reported on 10/27/2021)   neomycin-polymyxin-hydrocortisone (CORTISPORIN) OTIC solution Apply 1-2 drops to toe after soaking BID (Patient not taking: Reported on 04/29/2020)   No facility-administered encounter medications on file as of 10/27/2021.     REVIEW OF SYSTEMS:  Gen: + Night sweats and fatigue. No weight  loss.  CV: Denies chest pain, palpitations or edema. Resp: + Cough. No SOB or hemoptysis.  GI: See HPI. GU : Denies urinary burning, blood in urine, increased urinary frequency or incontinence. MS: + Back pain. Derm: Denies rash, itchiness, skin lesions or unhealing ulcers. Psych: Denies depression, anxiety, memory loss, suicidal ideation and confusion. Heme: Denies bruising, easy bleeding. Neuro:  + Headaches.  Endo:  Denies any problems with DM, thyroid or adrenal function.  PHYSICAL EXAM: BP 118/80   Pulse 80   Ht '5\' 1"'  (1.549 m)   Wt 170 lb 9.6 oz (77.4 kg)   LMP  (LMP Unknown)   SpO2 98%   BMI 32.23 kg/m  Wt Readings from Last 3 Encounters:  10/27/21 170 lb 9.6 oz (77.4 kg)  05/08/20 164 lb (74.4 kg)  05/07/20 164 lb 8 oz (74.6 kg)    General: 46 year old female in no acute distress. Head: Normocephalic and atraumatic. Eyes:  Sclerae non-icteric, conjunctive pink. Ears: Normal auditory acuity. Mouth:  Dentition intact. No ulcers or lesions.  Neck: Supple, no lymphadenopathy or thyromegaly.  Lungs: Clear bilaterally to auscultation without wheezes, crackles or rhonchi. Heart: Regular rate and rhythm. No murmur, rub or gallop appreciated.  Abdomen: Soft, nontender, non distended.  Supraumbilical hernia nontender.  No hepatosplenomegaly. Normoactive bowel sounds x 4 quadrants.  Rectal: Deferred. Musculoskeletal: Symmetrical with no gross deformities. Skin: Warm and dry. No rash or lesions on visible extremities. Extremities: No edema. Neurological: Alert oriented x 4, no focal deficits.  Psychological:  Alert and cooperative. Normal mood and affect.  ASSESSMENT AND PLAN:  20) 46 year old female with lower abdominal pain x 2 months and diarrhea x 2 to 3 weeks. She took antibiotics July and August 2023 for otitis media. -GI pathogen panel to include C. Diff PCR -CBC, CMP and CRP -Bland diet, push fluids -Dicyclomine 10 mg 1 p.o. every 8 hours. -Patient to contact office  if symptoms worsen  2) Colon cancer screening -Colonoscopy benefits and risks discussed including risk with sedation, risk of bleeding, perforation and infection  -Patient has 1 kidney therefore I vies the patient to hydrate well 3 to 4 days prior to her colonoscopy prep date and to remain well-hydrated the day before her procedure on clear liquids to avoid dehydration and kidney injury -If C. difficile positive, screening colonoscopy will be deferred for a few months  3) History of GERD, infrequent heartburn relieved by taking Tums or Alka-Seltzer as needed -GERD diet -Patient to contact office if GERD symptoms increase         CC:  Everlene Farrier, MD

## 2021-10-27 NOTE — Patient Instructions (Addendum)
If you are age 48 or older, your body mass index should be between 23-30. Your Body mass index is 32.23 kg/m. If this is out of the aforementioned range listed, please consider follow up with your Primary Care Provider.  If you are age 78 or younger, your body mass index should be between 19-25. Your Body mass index is 32.23 kg/m. If this is out of the aformentioned range listed, please consider follow up with your Primary Care Provider.   ________________________________________________________  Please go to the lab in the basement of our building to have lab work done as you leave today. Hit "B" for basement when you get on the elevator.  When the doors open the lab is on your left.  We will call you with the results. Thank you.   You have been scheduled for a colonoscopy with Dr. Candis Schatz. Please follow written instructions given to you at your visit today.  Please pick up your prep supplies at the pharmacy within the next 1-3 days. If you use inhalers (even only as needed), please bring them with you on the day of your procedure.  Please push fluids.  We have sent the following medications to your pharmacy for you to pick up at your convenience: Dicyclomine 10 mg (Bentyl): Take every 8 hours as needed  Thank you for entrusting me with your care and for choosing [facility] [place]

## 2021-10-27 NOTE — Progress Notes (Signed)
Agree with the assessment and plan as outlined by Colleen Kennedy-Smith, NP.    Cambrea Kirt E. Vernal Rutan, MD [facility]  

## 2021-11-04 ENCOUNTER — Other Ambulatory Visit: Payer: BC Managed Care – PPO

## 2021-11-04 DIAGNOSIS — R197 Diarrhea, unspecified: Secondary | ICD-10-CM

## 2021-11-04 DIAGNOSIS — Z1211 Encounter for screening for malignant neoplasm of colon: Secondary | ICD-10-CM

## 2021-11-04 DIAGNOSIS — R1030 Lower abdominal pain, unspecified: Secondary | ICD-10-CM

## 2021-11-06 LAB — GI PROFILE, STOOL, PCR

## 2021-11-20 ENCOUNTER — Encounter: Payer: Self-pay | Admitting: Gastroenterology

## 2021-11-28 ENCOUNTER — Encounter: Payer: Self-pay | Admitting: Gastroenterology

## 2021-11-28 ENCOUNTER — Ambulatory Visit (AMBULATORY_SURGERY_CENTER): Payer: BC Managed Care – PPO | Admitting: Gastroenterology

## 2021-11-28 VITALS — BP 116/60 | HR 82 | Temp 96.4°F | Resp 16 | Ht 61.0 in | Wt 170.0 lb

## 2021-11-28 DIAGNOSIS — D123 Benign neoplasm of transverse colon: Secondary | ICD-10-CM

## 2021-11-28 DIAGNOSIS — R197 Diarrhea, unspecified: Secondary | ICD-10-CM

## 2021-11-28 DIAGNOSIS — K635 Polyp of colon: Secondary | ICD-10-CM | POA: Diagnosis present

## 2021-11-28 DIAGNOSIS — K621 Rectal polyp: Secondary | ICD-10-CM

## 2021-11-28 DIAGNOSIS — Z1211 Encounter for screening for malignant neoplasm of colon: Secondary | ICD-10-CM

## 2021-11-28 DIAGNOSIS — D128 Benign neoplasm of rectum: Secondary | ICD-10-CM

## 2021-11-28 NOTE — Op Note (Signed)
[facility] Patient Name: [name] Procedure Date: 11/28/2021 10:27 AM MRN: [id number] Endoscopist: Nicki Reaper E. Candis Schatz , MD, [id number] Age: 46 Referring MD:  Date of Birth: 25-Apr-1975 Gender: Female Account #: [id number] Procedure:                Colonoscopy Indications:              Screening for colorectal malignant neoplasm, This                            is the patient's first colonoscopy, Incidental                            diarrhea noted Medicines:                Monitored Anesthesia Care Procedure:                Pre-Anesthesia Assessment:                           - Prior to the procedure, a History and Physical                            was performed, and patient medications and                            allergies were reviewed. The patient's tolerance of                            previous anesthesia was also reviewed. The risks                            and benefits of the procedure and the sedation                            options and risks were discussed with the patient.                            All questions were answered, and informed consent                            was obtained. Prior Anticoagulants: The patient has                            taken no anticoagulant or antiplatelet agents. ASA                            Grade Assessment: II - A patient with mild systemic                            disease. After reviewing the risks and benefits,                            the patient was deemed in satisfactory condition to  undergo the procedure.                           After obtaining informed consent, the colonoscope                            was passed under direct vision. Throughout the                            procedure, the patient's blood pressure, pulse, and                            oxygen saturations were monitored continuously. The                            Colonoscope was introduced through the  anus and                            advanced to the the cecum, identified by                            appendiceal orifice and ileocecal valve. The                            colonoscopy was performed without difficulty. The                            patient tolerated the procedure well. The quality                            of the bowel preparation was good. The ileocecal                            valve, appendiceal orifice, and rectum were                            photographed. The bowel preparation used was SUPREP                            via split dose instruction. Scope In: 10:38:41 AM Scope Out: 10:59:37 AM Scope Withdrawal Time: 0 hours 17 minutes 29 seconds  Total Procedure Duration: 0 hours 20 minutes 56 seconds  Findings:                 Skin tags were found on perianal exam.                           The digital rectal exam was normal. Pertinent                            negatives include normal sphincter tone and no                            palpable rectal lesions.  A 5 mm polyp was found in the transverse colon. The                            polyp was flat. The polyp was removed with a cold                            snare. Resection and retrieval were complete. The                            pathology specimen was placed into Bottle Number 1.                            Estimated blood loss was minimal.                           Multiple sessile polyps were found in the rectum.                            The polyps were 1 to 4 mm in size. Three of these                            polyps were removed with a cold snare. Resection                            was complete, but only two polyps were retrieved.                            The pathology specimens were placed into Bottle                            Number 3. Estimated blood loss was minimal.                           Normal mucosa was found in the entire colon.                             Biopsies for histology were taken with a cold                            forceps from the ascending colon, transverse colon,                            descending colon and sigmoid colon for evaluation                            of microscopic colitis. The pathology specimen was                            placed into Bottle Number 2. Estimated blood loss                            was minimal.  The retroflexed view of the distal rectum and anal                            verge was normal and showed no anal or rectal                            abnormalities. Complications:            No immediate complications. Estimated Blood Loss:     Estimated blood loss was minimal. Impression:               - Perianal skin tags found on perianal exam.                           - One 5 mm polyp in the transverse colon, removed                            with a cold snare. Resected and retrieved.                           - Multiple 1 to 4 mm polyps in the rectum, removed                            with a cold snare. Complete resection. Partial                            retrieval.                           - Normal mucosa in the entire examined colon.                            Biopsied.                           - The distal rectum and anal verge are normal on                            retroflexion view. Recommendation:           - Patient has a contact number available for                            emergencies. The signs and symptoms of potential                            delayed complications were discussed with the                            patient. Return to normal activities tomorrow.                            Written discharge instructions were provided to the                            patient.                           -  Resume previous diet.                           - Continue present medications.                           - Await pathology results.                            - Repeat colonoscopy (date not yet determined) for                            surveillance based on pathology results. [name], MD 11/28/2021 11:07:50 AM This report has been signed electronically.

## 2021-11-28 NOTE — Progress Notes (Unsigned)
[facility] History and Physical   Primary Care Physician:  Forrest Moron, MD   Reason for Procedure:   Colon cancer screening  Plan:    Screening colonoscopy     HPI: [name] is a 46 y.o. female undergoing initial average risk screening colonoscopy.  She has no family history of colon cancer.  She has recurrent diarrhea and crampy abdominal pain and had a mildly elevated CRP earlier this month.   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   Synovitis of wrist    RIGHT WRIST   Tear of triangular fibrocartilage complex (TFCC)    RIGHT WRIST    Past Surgical History:  Procedure Laterality Date   ABDOMINAL HYSTERECTOMY     ANTERIOR CERVICAL DECOMP/DISCECTOMY FUSION N/A 05/08/2020   Procedure: ANTERIOR CERVICAL DECOMPRESSION FUSION CERVICAL 6- CERVICAL 7 WITH INSTRUMENTATION AND ALLOGRAFT;  Surgeon: Phylliss Bob, MD;  Location: [place];  Service: Orthopedics;  Laterality: N/A;   ANTERIOR CERVICAL DECOMPRESSION/DISCECTOMY FUSION 4 LEVELS N/A 08/18/2018   Procedure: ANTERIOR CERVICAL DECOMPRESSION FUSION, CERVICAL THREE TO FOUR, CERVICAL FOUR TO FIVE, CERVICAL FIVE TO SIX WITH INSTRUMENTATION AND ALLOGRAFT.;  Surgeon: Phylliss Bob, MD;  Location: [place];  Service: Orthopedics;  Laterality: N/A;   BENIGN BREAST BX  1993   BREAST SURGERY     CESAREAN SECTION  2001  & 2003   2003  W/  BILATERAL TUBAL LIGATION   CESAREAN SECTION N/A    Phreesia 06/27/2019   CYSTO/  RIGHT RETROGRADE PYELOGRAM/  RIGHT URETERAL STENT PLACMENT   03-01-2002   CYSTO/  STENT REMOVAL/  RIGHT ACCUSIZE RETROGRADE  URETEROPELVIC JUNCTION INCISION AND STENT REPLACEMENT  12-05-2001   EYE SURGERY     HERNIA REPAIR     LAPAROSCOPIC ASSISTED VAGINAL HYSTERECTOMY  2014   LEEP/  D & C HYSTEROSCOPY/  NOVASURE ENDOMETRIAL ABLATION  08-23-2006   REPAIR LEFT TEAR DUCT INJURY  2013   SPINE SURGERY N/A    Phreesia 06/27/2019   TOTAL NEPHRECTOMY Right 04-12-2002   TUBAL LIGATION     UMBILICAL HERNIA REPAIR  12-11-2008   LAPAROSCOPIC   WRIST ARTHROSCOPY Right 09/19/2014   Procedure: RIGHT WRIST ARTHROSCOPY AND TFCC ((TRIANGULAR FIBROCARTILAGE COMPLEX) DEBRIDEMENT ;  Surgeon: Iran Planas, MD;  Location: [place];  Service: Orthopedics;  Laterality: Right;    Prior to Admission medications   Medication Sig Start Date End Date Taking? Authorizing Provider  acetaminophen (TYLENOL) 500 MG tablet Take 1,000 mg by mouth 2 (two) times daily.   Yes [provider]  busPIRone (BUSPAR) 10 MG tablet Take 1 tablet (10 mg total) by mouth 3 (three) times daily. 08/13/20  Yes Montine Circle, PA-C  Calcium Carbonate-Simethicone (ALKA-SELTZER HEARTBURN + GAS) 750-80 MG CHEW Chew 2 tablets by mouth daily as needed (acid reflux).   Yes [provider]  dicyclomine (BENTYL) 10 MG capsule Take 1 capsule (10 mg total) by mouth every 8 (eight) hours as needed for spasms. 10/27/21  Yes Noralyn Pick, NP  diphenhydrAMINE (BENADRYL) 25 MG tablet Take 50 mg by mouth every 6 (six)  hours as needed for allergies.   Yes [provider]  Melatonin 10 MG TABS Take 20 mg by mouth at bedtime. Gummies   Yes [provider]  sertraline (ZOLOFT) 100 MG tablet Take 150 mg by mouth at bedtime. 05/22/19  Yes [provider]  HYDROcodone-acetaminophen (NORCO/VICODIN) 5-325 MG tablet Take 1 tablet by mouth every 4 (four) hours as needed for moderate pain ((score 4 to 6)). Patient not taking: Reported on 10/27/2021 05/09/20   Phylliss Bob, MD  methocarbamol (ROBAXIN) 500 MG tablet Take 1  tablet (500 mg total) by mouth every 6 (six) hours as needed for muscle spasms. Patient not taking: Reported on 10/27/2021 05/09/20   Phylliss Bob, MD  neomycin-polymyxin-hydrocortisone (CORTISPORIN) OTIC solution Apply 1-2 drops to toe after soaking BID Patient not taking: Reported on 04/29/2020 06/19/19   Wallene Huh, DPM  polyethylene glycol (MIRALAX / GLYCOLAX) packet Take 17 g by mouth daily as needed for moderate constipation.     [provider]    Current Outpatient Medications  Medication Sig Dispense Refill   acetaminophen (TYLENOL) 500 MG tablet Take 1,000 mg by mouth 2 (two) times daily.     busPIRone (BUSPAR) 10 MG tablet Take 1 tablet (10 mg total) by mouth 3 (three) times daily. 42 tablet 0   Calcium Carbonate-Simethicone (ALKA-SELTZER HEARTBURN + GAS) 750-80 MG CHEW Chew 2 tablets by mouth daily as needed (acid reflux).     dicyclomine (BENTYL) 10 MG capsule Take 1 capsule (10 mg total) by mouth every 8 (eight) hours as needed for spasms. 30 capsule 0   diphenhydrAMINE (BENADRYL) 25 MG tablet Take 50 mg by mouth every 6 (six) hours as needed for allergies.     Melatonin 10 MG TABS Take 20 mg by mouth at bedtime. Gummies     sertraline (ZOLOFT) 100 MG tablet Take 150 mg by mouth at bedtime.     HYDROcodone-acetaminophen (NORCO/VICODIN) 5-325 MG tablet Take 1 tablet by mouth every 4 (four) hours as needed for moderate pain ((score 4 to 6)). (Patient not taking: Reported on 10/27/2021) 30 tablet 0   methocarbamol (ROBAXIN) 500 MG tablet Take 1 tablet (500 mg total) by mouth every 6 (six) hours as needed for muscle spasms. (Patient not taking: Reported on 10/27/2021) 30 tablet 1   neomycin-polymyxin-hydrocortisone (CORTISPORIN) OTIC solution Apply 1-2 drops to toe after soaking BID (Patient not taking: Reported on 04/29/2020) 10 mL 1   polyethylene glycol (MIRALAX / GLYCOLAX) packet Take 17 g by mouth daily as needed for moderate constipation.      No current  facility-administered medications for this visit.    Allergies as of 11/28/2021   (No Known Allergies)    Family History  Problem Relation Age of Onset   Mental illness Mother        anxiety, Bipolar disorder   Diabetes Father    Asthma Brother    Diabetes Brother    Hyperlipidemia Brother    Cancer Maternal Grandmother        pancreatic   Asthma Son    Asthma Son    Liver cancer Neg Hx    Esophageal cancer Neg Hx    Colon cancer Neg Hx     Social History   Socioeconomic History   Marital status: Married    Spouse name: [name]   Number of children: 2   Years of education: college   Highest education level: Not on file  Occupational History   Occupation: Oceanographer  Comment: [employer]  Tobacco Use   Smoking status: Never   Smokeless tobacco: Never  Vaping Use   Vaping Use: Never used  Substance and Sexual Activity   Alcohol use: No    Alcohol/week: 0.0 standard drinks of alcohol    Comment: 2x year   Drug use: No   Sexual activity: Yes    Partners: Male    Birth control/protection: Surgical    Comment: Hysterectomy  Other Topics Concern   Not on file  Social History Narrative   Degree in Social Work from [employer]. Working on [employer] in Clinical biochemist.   Lives with her husband and their two sons.   Social Determinants of Health   Financial Resource Strain: Not on file  Food Insecurity: Not on file  Transportation Needs: Not on file  Physical Activity: Not on file  Stress: Not on file  Social Connections: Not on file  Intimate Partner Violence: Not on file    Review of Systems:  All other review of systems negative except as mentioned in the HPI.  Physical Exam: Vital signs BP 117/64   Pulse 88   Temp (!) 96.4 F (35.8 C) (Skin)   Ht '5\' 1"'$  (1.549 m)   Wt 170 lb (77.1 kg)   LMP  (LMP Unknown)   SpO2 99%   BMI 32.12 kg/m   General:   Alert,  Well-developed, well-nourished, pleasant and cooperative in  NAD Airway:  Mallampati 2 Lungs:  Clear throughout to auscultation.   Heart:  Regular rate and rhythm; no murmurs, clicks, rubs,  or gallops. Abdomen:  Soft, nontender and nondistended. Normal bowel sounds.   Neuro/Psych:  Normal mood and affect. A and O x 3   Sahmya Arai E. Candis Schatz, MD [facility]

## 2021-11-28 NOTE — Patient Instructions (Signed)
Handout on polyps given to you today  Await pathology results   YOU HAD AN ENDOSCOPIC PROCEDURE TODAY AT [facility]:   Refer to the procedure report that was given to you for any specific questions about what was found during the examination.  If the procedure report does not answer your questions, please call your gastroenterologist to clarify.  If you requested that your care partner not be given the details of your procedure findings, then the procedure report has been included in a sealed envelope for you to review at your convenience later.  YOU SHOULD EXPECT: Some feelings of bloating in the abdomen. Passage of more gas than usual.  Walking can help get rid of the air that was put into your GI tract during the procedure and reduce the bloating. If you had a lower endoscopy (such as a colonoscopy or flexible sigmoidoscopy) you may notice spotting of blood in your stool or on the toilet paper. If you underwent a bowel prep for your procedure, you may not have a normal bowel movement for a few days.  Please Note:  You might notice some irritation and congestion in your nose or some drainage.  This is from the oxygen used during your procedure.  There is no need for concern and it should clear up in a day or so.  SYMPTOMS TO REPORT IMMEDIATELY:  Following lower endoscopy (colonoscopy or flexible sigmoidoscopy):  Excessive amounts of blood in the stool  Significant tenderness or worsening of abdominal pains  Swelling of the abdomen that is new, acute  Fever of 100F or higher  For urgent or emergent issues, a gastroenterologist can be reached at any hour by calling [phone]. Do not use MyChart messaging for urgent concerns.    DIET:  We do recommend a small meal at first, but then you may proceed to your regular diet.  Drink plenty of fluids but you should avoid alcoholic beverages for 24 hours.  ACTIVITY:  You should plan to take it easy for the rest of today and you  should NOT DRIVE or use heavy machinery until tomorrow (because of the sedation medicines used during the test).    FOLLOW UP: Our staff will call the number listed on your records the next business day following your procedure.  We will call around 7:15- 8:00 am to check on you and address any questions or concerns that you may have regarding the information given to you following your procedure. If we do not reach you, we will leave a message.     If any biopsies were taken you will be contacted by phone or by letter within the next 1-3 weeks.  Please call us at [phone] if you have not heard about the biopsies in 3 weeks.    SIGNATURES/CONFIDENTIALITY: You and/or your care partner have signed paperwork which will be entered into your electronic medical record.  These signatures attest to the fact that that the information above on your After Visit Summary has been reviewed and is understood.  Full responsibility of the confidentiality of this discharge information lies with you and/or your care-partner.

## 2021-11-28 NOTE — Progress Notes (Signed)
Called to room to assist during endoscopic procedure.  Patient ID and intended procedure confirmed with present staff. Received instructions for my participation in the procedure from the performing physician.  

## 2021-11-28 NOTE — Progress Notes (Unsigned)
Sedate, gd SR, tolerated procedure well, VSS, report to RN 

## 2021-12-01 NOTE — Telephone Encounter (Signed)
  Follow up Call-     11/28/2021   10:06 AM  Call back number  Post procedure Call Back phone  # [phone]  Permission to leave phone message Yes     Patient questions:   Message left to call if necessary.

## 2021-12-08 NOTE — Progress Notes (Signed)
[name],  The polyp that was removed from your transverse colon was completely normal tissue. The multiple small polyps in the rectum were hyperplastic polyps.  These types of polyps are not considered precancerous and do not affect her risk for colon cancer. You are considered average risk for colon cancer, and I recommend you repeat a colonoscopy in 10 years.  The biopsies taken throughout your colon did not show any evidence of microscopic colitis to explain your diarrhea.  Your symptoms may be more consistent with irritable bowel syndrome.  I would recommend you make a follow-up office visit to further discuss management of your chronic GI symptoms.

## 2021-12-10 ENCOUNTER — Other Ambulatory Visit: Payer: Self-pay | Admitting: Nurse Practitioner

## 2022-01-08 ENCOUNTER — Ambulatory Visit: Payer: BC Managed Care – PPO | Admitting: Gastroenterology

## 2022-01-08 ENCOUNTER — Encounter: Payer: Self-pay | Admitting: Gastroenterology

## 2022-01-08 VITALS — BP 110/78 | HR 91 | Ht 61.0 in | Wt 172.0 lb

## 2022-01-08 DIAGNOSIS — K580 Irritable bowel syndrome with diarrhea: Secondary | ICD-10-CM

## 2022-01-08 MED ORDER — DICYCLOMINE HCL 10 MG PO CAPS: ORAL_CAPSULE | ORAL | 3 refills | Status: DC

## 2022-01-08 NOTE — Progress Notes (Signed)
HPI : [name] is a very pleasant 46 year old female with a history of anxiety and depression who presents for follow up of persistent abdominal pain and loose stools.  She was initially seen in our clinic on October 10th with several months of crampy abdominal pain and persistent loose stools.  Lab evaluation to include GI pathogen panel, CBC and CMP were unremarkable, but a CRP was slightly elevated at 8 (ULN 5).  She underwent a colonoscopy Nov 3rd which showed normal appearing mucosa throughout.  Random biopsies were negative for microscopic colitis. No precancerous polyps were found and she was recommended to repeat colonoscopy in 10 years.   Today, she reports that she continues to have occasional crampy abdominal pain which typically precedes the urge to defecate, and resolves following defecation.  She has been taking Bentyl as needed and this does help her symptoms.   Her stools continue to be loose in consistency, although she is only having a bowel movement every other day.  No blood in stool.  Stools are sometimes formed, but typically more loose (BSS 6).  She reports decreased appetite, often eating only once a day for the past 6 weeks.  She is not losing weight.  She admits that her depression has been more of an issue lately, and she has an appointment scheduled with behavioral health soon.  She has been taking Zoloft since 2015.   Colonoscopy Nov 28, 2021 Transverse colon polyp: normal mucosa Rectal polyps:  hyperplastic polyps Normal random biopsies.   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   Synovitis  of wrist    RIGHT WRIST   Tear of triangular fibrocartilage complex (TFCC)    RIGHT WRIST     Past Surgical History:  Procedure Laterality Date   ABDOMINAL HYSTERECTOMY     ANTERIOR CERVICAL DECOMP/DISCECTOMY FUSION N/A 05/08/2020   Procedure: ANTERIOR CERVICAL DECOMPRESSION FUSION CERVICAL 6- CERVICAL 7 WITH INSTRUMENTATION AND ALLOGRAFT;  Surgeon: Phylliss Bob, MD;  Location: [place];  Service: Orthopedics;  Laterality: N/A;   ANTERIOR CERVICAL DECOMPRESSION/DISCECTOMY FUSION 4 LEVELS N/A 08/18/2018   Procedure: ANTERIOR CERVICAL DECOMPRESSION FUSION, CERVICAL THREE TO FOUR, CERVICAL FOUR TO FIVE, CERVICAL FIVE TO SIX WITH INSTRUMENTATION AND ALLOGRAFT.;  Surgeon: Phylliss Bob, MD;  Location: [place];  Service: Orthopedics;  Laterality: N/A;   BENIGN BREAST BX  1993   BREAST SURGERY     CESAREAN SECTION  2001  & 2003   2003  W/  BILATERAL TUBAL LIGATION   CESAREAN SECTION N/A    Phreesia 06/27/2019   CYSTO/  RIGHT RETROGRADE PYELOGRAM/  RIGHT URETERAL STENT PLACMENT   03-01-2002   CYSTO/  STENT REMOVAL/  RIGHT ACCUSIZE RETROGRADE URETEROPELVIC JUNCTION INCISION AND STENT REPLACEMENT  12-05-2001   EYE SURGERY     HERNIA REPAIR     LAPAROSCOPIC ASSISTED VAGINAL HYSTERECTOMY  2014   LEEP/  D & C HYSTEROSCOPY/  NOVASURE ENDOMETRIAL ABLATION  08-23-2006   REPAIR LEFT TEAR DUCT INJURY  2013   SPINE SURGERY N/A    Phreesia 06/27/2019   TOTAL NEPHRECTOMY Right 04-12-2002  TUBAL LIGATION     UMBILICAL HERNIA REPAIR  12-11-2008   LAPAROSCOPIC   WRIST ARTHROSCOPY Right 09/19/2014   Procedure: RIGHT WRIST ARTHROSCOPY AND TFCC ((TRIANGULAR FIBROCARTILAGE COMPLEX) DEBRIDEMENT ;  Surgeon: Iran Planas, MD;  Location: [place];  Service: Orthopedics;  Laterality: Right;   Family History  Problem Relation Age of Onset   Mental illness Mother        anxiety, Bipolar disorder   Diabetes Father    Asthma Brother    Diabetes Brother    Hyperlipidemia Brother    Cancer  Maternal Grandmother        pancreatic   Asthma Son    Asthma Son    Liver cancer Neg Hx    Esophageal cancer Neg Hx    Colon cancer Neg Hx    Social History   Tobacco Use   Smoking status: Never   Smokeless tobacco: Never  Vaping Use   Vaping Use: Never used  Substance Use Topics   Alcohol use: No    Alcohol/week: 0.0 standard drinks of alcohol    Comment: 2x year   Drug use: No   Current Outpatient Medications  Medication Sig Dispense Refill   acetaminophen (TYLENOL) 500 MG tablet Take 1,000 mg by mouth 2 (two) times daily.     busPIRone (BUSPAR) 10 MG tablet Take 1 tablet (10 mg total) by mouth 3 (three) times daily. 42 tablet 0   Calcium Carbonate-Simethicone (ALKA-SELTZER HEARTBURN + GAS) 750-80 MG CHEW Chew 2 tablets by mouth daily as needed (acid reflux).     dicyclomine (BENTYL) 10 MG capsule TAKE 1 CAPSULE BY MOUTH EVERY 8 HOURS AS NEEDED FOR SPASMS 30 capsule 0   diphenhydrAMINE (BENADRYL) 25 MG tablet Take 50 mg by mouth every 6 (six) hours as needed for allergies.     HYDROcodone-acetaminophen (NORCO/VICODIN) 5-325 MG tablet Take 1 tablet by mouth every 4 (four) hours as needed for moderate pain ((score 4 to 6)). 30 tablet 0   polyethylene glycol (MIRALAX / GLYCOLAX) packet Take 17 g by mouth daily as needed for moderate constipation.      sertraline (ZOLOFT) 100 MG tablet Take 150 mg by mouth at bedtime.     Melatonin 10 MG TABS Take 20 mg by mouth at bedtime. Gummies (Patient not taking: Reported on 01/08/2022)     methocarbamol (ROBAXIN) 500 MG tablet Take 1 tablet (500 mg total) by mouth every 6 (six) hours as needed for muscle spasms. (Patient not taking: Reported on 10/27/2021) 30 tablet 1   neomycin-polymyxin-hydrocortisone (CORTISPORIN) OTIC solution Apply 1-2 drops to toe after soaking BID (Patient not taking: Reported on 04/29/2020) 10 mL 1   No current facility-administered medications for this visit.   No Known Allergies   Review of Systems: All systems  reviewed and negative except where noted in HPI.    No results found.  Physical Exam: BP 110/78   Pulse 91   Ht '5\' 1"'$  (1.549 m)   Wt 172 lb (78 kg)   LMP  (LMP Unknown)   BMI 32.50 kg/m  Constitutional: Pleasant,well-developed, African American female in no acute distress. HEENT: Normocephalic and atraumatic. Conjunctivae are normal. No scleral icterus. Cardiovascular: Normal rate, regular rhythm.  Pulmonary/chest: Effort normal and breath sounds normal. No wheezing, rales or rhonchi. Abdominal: Soft, nondistended, nontender. Bowel sounds active throughout. There are no masses palpable. No hepatomegaly. Extremities: no edema Neurological: Alert and oriented to person place and time. Skin: Skin is warm and dry. No rashes  noted. Psychiatric: Normal mood and affect. Behavior is normal.  CBC    Component Value Date/Time   WBC 7.9 10/27/2021 1547   RBC 4.11 10/27/2021 1547   HGB 12.7 10/27/2021 1547   HGB 13.7 08/31/2017 0955   HCT 37.9 10/27/2021 1547   HCT 40.3 08/31/2017 0955   PLT 181.0 10/27/2021 1547   PLT 247 08/31/2017 0955   MCV 92.1 10/27/2021 1547   MCV 92 08/31/2017 0955   MCH 30.0 05/07/2020 1454   MCHC 33.5 10/27/2021 1547   RDW 13.8 10/27/2021 1547   RDW 13.6 08/31/2017 0955   LYMPHSABS 3.2 10/27/2021 1547   MONOABS 0.5 10/27/2021 1547   EOSABS 0.1 10/27/2021 1547   BASOSABS 0.1 10/27/2021 1547    CMP     Component Value Date/Time   NA 138 10/27/2021 1547   NA 144 06/30/2019 1640   K 3.8 10/27/2021 1547   CL 104 10/27/2021 1547   CO2 26 10/27/2021 1547   GLUCOSE 83 10/27/2021 1547   BUN 9 10/27/2021 1547   BUN 8 06/30/2019 1640   CREATININE 0.94 10/27/2021 1547   CALCIUM 9.4 10/27/2021 1547   PROT 7.6 10/27/2021 1547   PROT 6.6 06/30/2019 1640   ALBUMIN 4.4 10/27/2021 1547   ALBUMIN 4.3 06/30/2019 1640   AST 16 10/27/2021 1547   ALT 16 10/27/2021 1547   ALKPHOS 46 10/27/2021 1547   BILITOT 0.3 10/27/2021 1547   BILITOT 0.2 06/30/2019 1640    GFRNONAA >60 05/07/2020 1454   GFRAA 85 06/30/2019 1640     ASSESSMENT AND PLAN: 46 year old female with persistent crampy abdominal pain and persistent, but infrequent loose stools for the past 5-6 months, consistent with IBS.  Colonoscopy unremarkable.   We discussed the proposed pathophysiology of IBS and gut brain axis disorders in general.  We discussed management of IBS, to include use of medications to improve bowel habits, as needed pain medicine, centrally acting neuromodulators, role of empiric dietary modifications to include a low FODMAP diet gluten-free diet, as well as the role of cognitive therapies.  We discussed the goals of IBS management, namely to minimize the impact of GI symptoms on quality of life.  The patient was provided handouts with further information on IBS.   I recommended she start taking Metamucil to improve her stool bulk and frequency, and continue taking the Bentyl, ok to take daily if needed. The patient's depression has been more prominent lately, which is likely having a role in her GI symptoms.  She is seeing [facility] soon; hopefully she will find some improvement in her depression treatment and her IBS symptoms will also likely improve.  Although addition of a TCA may help with her IBS symptoms, will hold off on this for now until her psychiatry evaluation and treatment plan is more complete.  IBS - Metamucil daily - Low FODMAP/IBS handout - Refill bentyl, ok to use daily - Discussed TCAs, hold off, seeing psychiatry soon  Kazoua Gossen E. Candis Schatz, MD [facility]   CC:  Forrest Moron, MD

## 2022-01-08 NOTE — Patient Instructions (Addendum)
_______________________________________________________  If you are age 48 or older, your body mass index should be between 23-30. Your Body mass index is 32.5 kg/m. If this is out of the aforementioned range listed, please consider follow up with your Primary Care Provider.  If you are age 27 or younger, your body mass index should be between 19-25. Your Body mass index is 32.5 kg/m. If this is out of the aformentioned range listed, please consider follow up with your Primary Care Provider.   Please purchase Metamucil over the counter. Take as directed.   We have sent the following medications to your pharmacy for you to pick up at your convenience: Increase to Bentyl 2-3 times daily   Follow Low fodmap diet and IBS handout.   The [facility] providers would like to encourage you to use [facility] to communicate with providers for non-urgent requests or questions.  Due to long hold times on the telephone, sending your provider a message by [facility] may be a faster and more efficient way to get a response.  Please allow 48 business hours for a response.  Please remember that this is for non-urgent requests.   It was a pleasure to see you today!  Thank you for trusting me with your gastrointestinal care!

## 2022-01-23 ENCOUNTER — Other Ambulatory Visit: Payer: Self-pay | Admitting: Orthopedic Surgery

## 2022-01-23 DIAGNOSIS — G8929 Other chronic pain: Secondary | ICD-10-CM

## 2022-01-26 HISTORY — PX: HIP FUSION: SHX669

## 2022-01-27 ENCOUNTER — Ambulatory Visit
Admission: RE | Admit: 2022-01-27 | Discharge: 2022-01-27 | Disposition: A | Discharge status: Home / Self Care | Payer: BC Managed Care – PPO | Source: Ambulatory Visit | Attending: Orthopedic Surgery | Admitting: Orthopedic Surgery

## 2022-01-27 DIAGNOSIS — G8929 Other chronic pain: Secondary | ICD-10-CM

## 2022-03-17 ENCOUNTER — Encounter: Payer: Self-pay | Admitting: Gastroenterology

## 2022-03-17 ENCOUNTER — Ambulatory Visit: Payer: BC Managed Care – PPO | Admitting: Gastroenterology

## 2022-03-17 VITALS — BP 104/68 | HR 97 | Ht 61.0 in | Wt 163.0 lb

## 2022-03-17 DIAGNOSIS — K581 Irritable bowel syndrome with constipation: Secondary | ICD-10-CM

## 2022-03-17 NOTE — Patient Instructions (Signed)
A high fiber diet with plenty of fluids (up to 8 glasses of water daily) is suggested to relieve these symptoms.  Metamucil, 1- 2 capsule daily can be used to keep bowels regular if needed.  If no improvement in your bowel movements in 3-4 weeks, Dr Candis Schatz recommends that you complete a bowel purge (to clean out your bowels). Please do the following: Purchase a bottle of Miralax over the counter as well as a box of 5 mg dulcolax tablets. Take 4 dulcolax tablets. Wait 1 hour. You will then drink 6-8 capfuls of Miralax mixed in an adequate amount of water/juice/gatorade (you may choose which of these liquids to drink) over the next 2-3 hours. You should expect results within 1 to 6 hours after completing the bowel purge.   _______________________________________________________  If your blood pressure at your visit was 140/90 or greater, please contact your primary care physician to follow up on this.  _______________________________________________________  If you are age 48 or older, your body mass index should be between 23-30. Your Body mass index is 30.8 kg/m. If this is out of the aforementioned range listed, please consider follow up with your Primary Care Provider.  If you are age 31 or younger, your body mass index should be between 19-25. Your Body mass index is 30.8 kg/m. If this is out of the aformentioned range listed, please consider follow up with your Primary Care Provider.   ________________________________________________________  The [facility] providers would like to encourage you to use [facility] to communicate with providers for non-urgent requests or questions.  Due to long hold times on the telephone, sending your provider a message by [facility] may be a faster and more efficient way to get a response.  Please allow 48 business hours for a response.  Please remember that this is for non-urgent requests.  _______________________________________________________  Thank  you for choosing me and [facility].  Dr. Dustin Flock

## 2022-03-17 NOTE — Progress Notes (Unsigned)
HPI : [name] is a very pleasant 47 year old female with anxiety, depression and IBS who presents to clinic today for follow up.  She was last seen by me Dec 14th at which time she was diagnosed with IBS and we discussed the pathophysiology of IBS and gut-brain axis disorders and the principles of IBS management.  She was recommended to start taking Metamucil to improve her stool bulk and consistency and to take Bentyl as needed for abdominal pain.  She was provided information on a low FODMAP diet.  Today, she reports that her symptoms are about the same as they were in December.  She continues to have infrequent bowel movements, usually 1-2 per week.  Stools continue to be small and poorly formed.  She has crampy abdominal pain which often precedes the urge to defecate.    She did not try taking metamucil as was recommended.  She does take Bentyl, usually once a day, but is not certain it helps a lot.  She has not made any dietary changes.  She reports her appetite is very poor and she has to force herself to eat sometimes.    She feels like her anxiety and depression are a little better compared to a few months ago.  She was recently started on Buspar in January by her psychiatrist.      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   Synovitis of wrist    RIGHT WRIST   Tear of triangular fibrocartilage complex (TFCC)    RIGHT WRIST     Past Surgical History:  Procedure Laterality Date   ABDOMINAL HYSTERECTOMY     ANTERIOR CERVICAL DECOMP/DISCECTOMY FUSION N/A 05/08/2020   Procedure: ANTERIOR CERVICAL  DECOMPRESSION FUSION CERVICAL 6- CERVICAL 7 WITH INSTRUMENTATION AND ALLOGRAFT;  Surgeon: Phylliss Bob, MD;  Location: [place];  Service: Orthopedics;  Laterality: N/A;   ANTERIOR CERVICAL DECOMPRESSION/DISCECTOMY FUSION 4 LEVELS N/A 08/18/2018   Procedure: ANTERIOR CERVICAL DECOMPRESSION FUSION, CERVICAL THREE TO FOUR, CERVICAL FOUR TO FIVE, CERVICAL FIVE TO SIX WITH INSTRUMENTATION AND ALLOGRAFT.;  Surgeon: Phylliss Bob, MD;  Location: [place];  Service: Orthopedics;  Laterality: N/A;   BENIGN BREAST BX  1993   BREAST SURGERY     CESAREAN SECTION  2001  & 2003   2003  W/  BILATERAL TUBAL LIGATION   CESAREAN SECTION N/A    Phreesia 06/27/2019   CYSTO/  RIGHT RETROGRADE PYELOGRAM/  RIGHT URETERAL STENT PLACMENT   03-01-2002   CYSTO/  STENT REMOVAL/  RIGHT ACCUSIZE RETROGRADE URETEROPELVIC JUNCTION INCISION AND STENT REPLACEMENT  12-05-2001   EYE SURGERY     HERNIA REPAIR     LAPAROSCOPIC ASSISTED VAGINAL HYSTERECTOMY  2014   LEEP/  D & C HYSTEROSCOPY/  NOVASURE ENDOMETRIAL ABLATION  08-23-2006   REPAIR LEFT TEAR DUCT INJURY  2013   SPINE SURGERY N/A    Phreesia 06/27/2019   TOTAL NEPHRECTOMY Right 04-12-2002   TUBAL LIGATION     UMBILICAL HERNIA REPAIR  12-11-2008   LAPAROSCOPIC   WRIST ARTHROSCOPY Right 09/19/2014   Procedure: RIGHT WRIST ARTHROSCOPY  AND TFCC ((TRIANGULAR FIBROCARTILAGE COMPLEX) DEBRIDEMENT ;  Surgeon: Iran Planas, MD;  Location: [place];  Service: Orthopedics;  Laterality: Right;   Family History  Problem Relation Age of Onset   Mental illness Mother        anxiety, Bipolar disorder   Diabetes Father    Asthma Brother    Diabetes Brother    Hyperlipidemia Brother    Cancer Maternal Grandmother        pancreatic   Asthma Son    Asthma Son    Liver cancer Neg Hx    Esophageal cancer Neg Hx    Colon cancer Neg Hx    Social History   Tobacco Use   Smoking status: Never   Smokeless tobacco: Never  Vaping Use   Vaping Use: Never used   Substance Use Topics   Alcohol use: No    Alcohol/week: 0.0 standard drinks of alcohol    Comment: 2x year   Drug use: No   Current Outpatient Medications  Medication Sig Dispense Refill   acetaminophen (TYLENOL) 500 MG tablet Take 1,000 mg by mouth 2 (two) times daily.     busPIRone (BUSPAR) 10 MG tablet Take 1 tablet (10 mg total) by mouth 3 (three) times daily. 42 tablet 0   busPIRone (BUSPAR) 15 MG tablet Take 15 mg by mouth 2 (two) times daily.     Calcium Carbonate-Simethicone (ALKA-SELTZER HEARTBURN + GAS) 750-80 MG CHEW Chew 2 tablets by mouth daily as needed (acid reflux).     diphenhydrAMINE (BENADRYL) 25 MG tablet Take 50 mg by mouth every 6 (six) hours as needed for allergies.     polyethylene glycol (MIRALAX / GLYCOLAX) packet Take 17 g by mouth daily as needed for moderate constipation.      sertraline (ZOLOFT) 100 MG tablet Take 150 mg by mouth at bedtime.     dicyclomine (BENTYL) 10 MG capsule TAKE 1 CAPSULE BY MOUTH EVERY 8 HOURS AS NEEDED FOR SPASMS (Patient not taking: Reported on 03/17/2022) 90 capsule 3   HYDROcodone-acetaminophen (NORCO/VICODIN) 5-325 MG tablet Take 1 tablet by mouth every 4 (four) hours as needed for moderate pain ((score 4 to 6)). (Patient not taking: Reported on 03/17/2022) 30 tablet 0   Melatonin 10 MG TABS Take 20 mg by mouth at bedtime. Gummies (Patient not taking: Reported on 01/08/2022)     methocarbamol (ROBAXIN) 500 MG tablet Take 1 tablet (500 mg total) by mouth every 6 (six) hours as needed for muscle spasms. (Patient not taking: Reported on 10/27/2021) 30 tablet 1   neomycin-polymyxin-hydrocortisone (CORTISPORIN) OTIC solution Apply 1-2 drops to toe after soaking BID (Patient not taking: Reported on 04/29/2020) 10 mL 1   No current facility-administered medications for this visit.   No Known Allergies   Review of Systems: All systems reviewed and negative except where noted in HPI.    No results found.  Physical Exam: BP 104/68    Pulse 97   Ht 5' 1"$  (1.549 m)   Wt 163 lb (73.9 kg)   LMP  (LMP Unknown)   BMI 30.80 kg/m  Constitutional: Pleasant,well-developed, African American female in no acute distress. HEENT: Normocephalic and atraumatic. Conjunctivae are normal. No scleral icterus. Neck supple.  Cardiovascular: Normal rate, regular rhythm.  Pulmonary/chest: Effort normal and breath sounds normal. No wheezing, rales or rhonchi. Abdominal: Soft, nondistended, diffuse, mild tenderness to palpation without rigidity or guarding. Bowel sounds active throughout. There are no masses palpable. No hepatomegaly. Extremities: no edema Neurological: Alert  and oriented to person place and time. Skin: Skin is warm and dry. No rashes noted. Psychiatric: Normal mood and affect. Behavior is normal.  CBC    Component Value Date/Time   WBC 7.9 10/27/2021 1547   RBC 4.11 10/27/2021 1547   HGB 12.7 10/27/2021 1547   HGB 13.7 08/31/2017 0955   HCT 37.9 10/27/2021 1547   HCT 40.3 08/31/2017 0955   PLT 181.0 10/27/2021 1547   PLT 247 08/31/2017 0955   MCV 92.1 10/27/2021 1547   MCV 92 08/31/2017 0955   MCH 30.0 05/07/2020 1454   MCHC 33.5 10/27/2021 1547   RDW 13.8 10/27/2021 1547   RDW 13.6 08/31/2017 0955   LYMPHSABS 3.2 10/27/2021 1547   MONOABS 0.5 10/27/2021 1547   EOSABS 0.1 10/27/2021 1547   BASOSABS 0.1 10/27/2021 1547    CMP     Component Value Date/Time   NA 138 10/27/2021 1547   NA 144 06/30/2019 1640   K 3.8 10/27/2021 1547   CL 104 10/27/2021 1547   CO2 26 10/27/2021 1547   GLUCOSE 83 10/27/2021 1547   BUN 9 10/27/2021 1547   BUN 8 06/30/2019 1640   CREATININE 0.94 10/27/2021 1547   CALCIUM 9.4 10/27/2021 1547   PROT 7.6 10/27/2021 1547   PROT 6.6 06/30/2019 1640   ALBUMIN 4.4 10/27/2021 1547   ALBUMIN 4.3 06/30/2019 1640   AST 16 10/27/2021 1547   ALT 16 10/27/2021 1547   ALKPHOS 46 10/27/2021 1547   BILITOT 0.3 10/27/2021 1547   BILITOT 0.2 06/30/2019 1640   GFRNONAA >60 05/07/2020 1454    GFRAA 85 06/30/2019 1640     ASSESSMENT AND PLAN: 47 year old female with anxiety, depression and IBS with continued symptoms of infrequent, small poorly formed stools with crampy abdominal pain and poor appetite.  She was recommended to take a daily fiber supplement at her last visit, but she did do this.  Her poor appetite may be related to her constipation/infrequent bowel movements.  I again suggested that she try taking the fiber everyday and senna as needed  If her stools are not improving in frequency and bulk and her appetite is not improving after a month, I recommended she consider a purge prep, and then resume fiber, increasing frequency up to 2-3 times per day. We also discussed other options for IBS treatment to include TCAs and the [facility] app, but deferred these interventions for now.  IBS -  Metamucil daily -  Senna 17 mg PRN -  Consider purge prep if no improvement in appetite after a month -  Continue bentyl for now  Kathan Kirker E. Candis Schatz, [facility]  Forrest Moron, MD

## 2022-03-19 ENCOUNTER — Emergency Department (HOSPITAL_COMMUNITY)
Admission: EM | Admit: 2022-03-19 | Discharge: 2022-03-19 | Disposition: A | Discharge status: Home / Self Care | Payer: BC Managed Care – PPO | Attending: Emergency Medicine | Admitting: Emergency Medicine

## 2022-03-19 ENCOUNTER — Emergency Department (HOSPITAL_COMMUNITY): Payer: BC Managed Care – PPO

## 2022-03-19 DIAGNOSIS — Y9389 Activity, other specified: Secondary | ICD-10-CM | POA: Diagnosis not present

## 2022-03-19 DIAGNOSIS — W228XXA Striking against or struck by other objects, initial encounter: Secondary | ICD-10-CM | POA: Diagnosis not present

## 2022-03-19 DIAGNOSIS — Y92219 Unspecified school as the place of occurrence of the external cause: Secondary | ICD-10-CM | POA: Insufficient documentation

## 2022-03-19 DIAGNOSIS — S060X0A Concussion without loss of consciousness, initial encounter: Secondary | ICD-10-CM | POA: Insufficient documentation

## 2022-03-19 DIAGNOSIS — Y990 Civilian activity done for income or pay: Secondary | ICD-10-CM | POA: Diagnosis not present

## 2022-03-19 DIAGNOSIS — S0990XA Unspecified injury of head, initial encounter: Secondary | ICD-10-CM | POA: Diagnosis present

## 2022-03-19 MED ORDER — IBUPROFEN 400 MG PO TABS
600.0000 mg | ORAL_TABLET | Freq: Once | ORAL | Status: AC
  Administered 2022-03-19: 600 mg via ORAL
  Filled 2022-03-19: qty 1

## 2022-03-19 NOTE — ED Triage Notes (Signed)
Pt states that at work she was bent over to pick something up and when standing struck the front L side of her head on a [employer]. Pt denies LOC. No blood thinners. Pt with hx of spinal fusion

## 2022-03-19 NOTE — ED Provider Triage Note (Signed)
Emergency Medicine Provider Triage Evaluation Note  [name] , a 47 y.o. female  was evaluated in triage.  Pt complains of head injury that occurred about an hour ago.  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  No loss of consciousness.  States she had vision change, dizziness, and currently feels somnolent.  Not on anticoagulation.  Denies other complaints..  Review of Systems  Positive: As above Negative: As above  Physical Exam  BP (!) 153/100 (BP Location: Right Arm)   Pulse 85   Temp 98 F (36.7 C)   Resp 16   LMP  (LMP Unknown)   SpO2 97%  Gen:   Awake, no distress   Resp:  Normal effort  MSK:   Moves extremities without difficulty  Other:    Medical Decision Making  Medically screening exam initiated at 1:38 PM.  Appropriate orders placed.  [name] was informed that the remainder of the evaluation will be completed by another provider, this initial triage assessment does not replace that evaluation, and the importance of remaining in the ED until their evaluation is complete.     Evlyn Courier, PA-C 03/19/22 1341

## 2022-03-19 NOTE — ED Notes (Signed)
This RN reviewed discharge instructions with patient. She verbalized understanding and denied any further questions. PT well appearing upon discharge and reports tolerable pain. Pt ambulated with stable gait to exit. Pt endorses ride home.

## 2022-03-19 NOTE — ED Provider Notes (Signed)
[facility] Provider Note   CSN: [id number] Arrival date & time: 03/19/22  1323     History  Chief Complaint  Patient presents with   Concussion    [name] is a 47 y.o. female.  Patient is a 47 year old female who presents with head injury.  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  She denies any loss of consciousness.  She did complain of some blurry vision and headache with some nausea and dizziness.  She says currently her vision has improved and she still has a headache and some nausea.  No vomiting.  She was able to ambulate without difficulty.  She is not on anticoagulants.  She denies any other injuries.       Home Medications Prior to Admission medications   Medication Sig Start Date End Date Taking? Authorizing Provider  acetaminophen (TYLENOL) 500 MG tablet Take 1,000 mg by mouth 2 (two) times daily.    [provider]  busPIRone (BUSPAR) 10 MG tablet Take 1 tablet (10 mg total) by mouth 3 (three) times daily. 08/13/20   Montine Circle, PA-C  busPIRone (BUSPAR) 15 MG tablet Take 15 mg by mouth 2 (two) times daily. 03/06/22   [provider]  Calcium Carbonate-Simethicone (ALKA-SELTZER HEARTBURN + GAS) 750-80 MG CHEW Chew 2 tablets by mouth daily as needed (acid reflux).    [provider]  dicyclomine (BENTYL) 10 MG capsule TAKE 1 CAPSULE BY MOUTH EVERY 8 HOURS AS NEEDED FOR SPASMS Patient not taking: Reported on 03/17/2022 01/08/22   Daryel November, MD  diphenhydrAMINE (BENADRYL) 25 MG tablet Take 50 mg by mouth every 6 (six) hours as needed for allergies.    [provider]  HYDROcodone-acetaminophen (NORCO/VICODIN) 5-325 MG tablet Take 1 tablet by mouth every 4 (four) hours as needed for moderate pain ((score 4 to 6)). Patient not taking: Reported on 03/17/2022 05/09/20    Phylliss Bob, MD  Melatonin 10 MG TABS Take 20 mg by mouth at bedtime. Gummies Patient not taking: Reported on 01/08/2022    [provider]  methocarbamol (ROBAXIN) 500 MG tablet Take 1 tablet (500 mg total) by mouth every 6 (six) hours as needed for muscle spasms. Patient not taking: Reported on 10/27/2021 05/09/20   Phylliss Bob, MD  neomycin-polymyxin-hydrocortisone (CORTISPORIN) OTIC solution Apply 1-2 drops to toe after soaking BID Patient not taking: Reported on 04/29/2020 06/19/19   Wallene Huh, DPM  polyethylene glycol (MIRALAX / GLYCOLAX) packet Take 17 g by mouth daily as needed for moderate constipation.     [provider]  sertraline (ZOLOFT) 100 MG tablet Take 150 mg by mouth at bedtime. 05/22/19   [provider]      Allergies    Patient has no known allergies.    Review of Systems   Review of Systems  Constitutional:  Negative for chills, diaphoresis, fatigue and fever.  HENT:  Negative for congestion, rhinorrhea and sneezing.   Eyes:  Positive for visual disturbance.  Respiratory:  Negative for cough, chest tightness and shortness of breath.   Cardiovascular:  Negative for chest pain and leg swelling.  Gastrointestinal:  Positive for nausea. Negative for abdominal pain, blood in stool, diarrhea and vomiting.  Genitourinary:  Negative for difficulty urinating, flank pain, frequency and hematuria.  Musculoskeletal:  Negative for arthralgias and back pain.  Skin:  Negative for rash.  Neurological:  Positive for dizziness and headaches. Negative for speech difficulty, weakness and numbness.    Physical Exam Updated Vital Signs BP (!) 153/100 (BP Location: Right Arm)   Pulse 85   Temp 98 F (36.7 C)   Resp 16   LMP  (LMP Unknown)   SpO2 97%  Physical Exam Constitutional:      Appearance: She is well-developed.  HENT:     Head: Normocephalic and atraumatic.  Eyes:     Pupils: Pupils are equal, round, and reactive to light.   Cardiovascular:     Rate and Rhythm: Normal rate and regular rhythm.     Heart sounds: Normal heart sounds.  Pulmonary:     Effort: Pulmonary effort is normal. No respiratory distress.     Breath sounds: Normal breath sounds. No wheezing or rales.  Chest:     Chest wall: No tenderness.  Abdominal:     General: Bowel sounds are normal.     Palpations: Abdomen is soft.     Tenderness: There is no abdominal tenderness. There is no guarding or rebound.  Musculoskeletal:        General: Normal range of motion.     Cervical back: Normal range of motion and neck supple.  Lymphadenopathy:     Cervical: No cervical adenopathy.  Skin:    General: Skin is warm and dry.     Findings: No rash.  Neurological:     Mental Status: She is alert and oriented to person, place, and time.     Comments: Motor 5/5 all extremities Sensation grossly intact to LT all extremities Finger to Nose intact, no pronator drift CN II-XII grossly intact       ED Results / Procedures / Treatments   Labs (all labs ordered are listed, but only abnormal results are displayed) Labs Reviewed - No data to display  EKG None  Radiology CT Head Wo Contrast  Result Date: 03/19/2022 CLINICAL DATA:  Poly trauma, blunt.  Struck head while standing up. EXAM: CT HEAD WITHOUT CONTRAST TECHNIQUE: Contiguous axial images were obtained from the base of the skull through the vertex without intravenous contrast. RADIATION DOSE REDUCTION: This exam was performed according to the departmental dose-optimization program which includes automated exposure control, adjustment of the mA and/or kV according to patient size and/or use of iterative reconstruction technique. COMPARISON:  None 10/15/2009 FINDINGS: Brain: No evidence of acute infarction, hemorrhage, hydrocephalus, extra-axial collection or mass lesion/mass effect. Empty sella configuration. Vascular: No hyperdense vessel or unexpected calcification. Skull: Normal. Negative for  fracture or focal lesion. Sinuses/Orbits: No acute finding. Other: None. IMPRESSION: No acute intracranial abnormalities. Electronically Signed   By: Lucienne Capers M.D.   On: 03/19/2022 15:17    Procedures Procedures    Medications Ordered in ED Medications  ibuprofen (ADVIL) tablet 600 mg (has no administration in time range)    ED Course/ Medical Decision Making/ A&P                             Medical Decision Making  Patient is a 47 year old who presents after head injury.  Currently she has a headache and some nausea but no other significant symptoms.  No neurologic deficits.  No ataxia.  No ongoing visual disturbances.  She had a head CT which shows no evidence of intracranial hemorrhage.  She was given dose of ibuprofen for symptomatic relief.  She was discharged  home in good condition.  She was given concussion precautions and advised to follow-up with her primary care doctor for recheck.  Her blood pressure is mildly elevated and she can have this rechecked at her outpatient follow-up.  Return precautions were given.  Final Clinical Impression(s) / ED Diagnoses Final diagnoses:  Concussion without loss of consciousness, initial encounter    Rx / DC Orders ED Discharge Orders     None         Malvin Johns, MD 03/19/22 1715

## 2022-04-10 ENCOUNTER — Other Ambulatory Visit: Payer: Self-pay | Admitting: Gastroenterology

## 2022-10-09 ENCOUNTER — Other Ambulatory Visit: Payer: Self-pay | Admitting: Gastroenterology

## 2022-12-01 ENCOUNTER — Other Ambulatory Visit: Payer: Self-pay | Admitting: Family Medicine

## 2022-12-01 DIAGNOSIS — G44209 Tension-type headache, unspecified, not intractable: Secondary | ICD-10-CM

## 2022-12-14 ENCOUNTER — Encounter: Payer: Self-pay | Admitting: Family Medicine

## 2022-12-19 ENCOUNTER — Ambulatory Visit
Admission: RE | Admit: 2022-12-19 | Discharge: 2022-12-19 | Disposition: A | Discharge status: Home / Self Care | Payer: BC Managed Care – PPO | Source: Ambulatory Visit | Attending: Family Medicine | Admitting: Family Medicine

## 2022-12-19 DIAGNOSIS — G44209 Tension-type headache, unspecified, not intractable: Secondary | ICD-10-CM

## 2023-02-25 ENCOUNTER — Telehealth: Payer: Self-pay | Admitting: Neurology

## 2023-02-25 NOTE — Telephone Encounter (Signed)
request to cancel appointment

## 2023-03-01 ENCOUNTER — Ambulatory Visit: Payer: Self-pay | Admitting: Neurology

## 2023-06-01 ENCOUNTER — Ambulatory Visit (INDEPENDENT_AMBULATORY_CARE_PROVIDER_SITE_OTHER)
Admission: RE | Admit: 2023-06-01 | Discharge: 2023-06-01 | Disposition: A | Discharge status: Home / Self Care | Source: Ambulatory Visit | Attending: Gastroenterology | Admitting: Gastroenterology

## 2023-06-01 ENCOUNTER — Encounter: Payer: Self-pay | Admitting: Gastroenterology

## 2023-06-01 ENCOUNTER — Ambulatory Visit: Payer: Self-pay | Admitting: Gastroenterology

## 2023-06-01 VITALS — BP 90/40 | HR 72 | Ht 60.25 in | Wt 172.5 lb

## 2023-06-01 DIAGNOSIS — R197 Diarrhea, unspecified: Secondary | ICD-10-CM

## 2023-06-01 DIAGNOSIS — K582 Mixed irritable bowel syndrome: Secondary | ICD-10-CM | POA: Diagnosis not present

## 2023-06-01 DIAGNOSIS — I959 Hypotension, unspecified: Secondary | ICD-10-CM

## 2023-06-01 NOTE — Patient Instructions (Addendum)
 A high fiber diet with plenty of fluids (up to 8 glasses of water daily) is suggested to relieve these symptoms.  Benefiber, 1 tablespoon once or twice daily can be used to keep bowels regular if needed.   Start taking Miralax  1 capful (17 grams) 1x / day for 1 week.   If this is not effective, increase to 1 dose 2x / day for 1 week.   If this is still not effective, increase to two capfuls (34 grams) 2x / day.   Can adjust dose as needed based on response. Can take 1/2 cap daily, skip days, or increase per day.    Your provider has requested that you have an abdominal x ray before leaving today. Please go to the basement floor to our Radiology department for the test.   _______________________________________________________  If your blood pressure at your visit was 140/90 or greater, please contact your primary care physician to follow up on this.  _______________________________________________________  If you are age 98 or older, your body mass index should be between 23-30. Your Body mass index is 33.41 kg/m. If this is out of the aforementioned range listed, please consider follow up with your Primary Care Provider.  If you are age 47 or younger, your body mass index should be between 19-25. Your Body mass index is 33.41 kg/m. If this is out of the aformentioned range listed, please consider follow up with your Primary Care Provider.   ________________________________________________________  The [facility] providers would like to encourage you to use [facility] to communicate with providers for non-urgent requests or questions.  Due to long hold times on the telephone, sending your provider a message by [facility] may be a faster and more efficient way to get a response.  Please allow 48 business hours for a response.  Please remember that this is for non-urgent requests.  _______________________________________________________

## 2023-06-01 NOTE — Progress Notes (Signed)
 Chief Complaint: IBS Primary GI MD: Dr. Cherryl Corona  HPI: 48 year old female history of anxiety, depression, IBS presents for evaluation of constipation  Last seen by Dr. Cherryl Corona February 2024.  At that time was having infrequent small poorly formed stools with crampy abdominal pain and poor appetite.  Recommended fiber daily and senna and if no improvement will purge prep  Discussed the use of AI scribe software for clinical note transcription with the patient, who gave verbal consent to proceed.  History of Present Illness She has been experiencing chronic diarrhea since February, initially occurring two to three times daily, which has since decreased to once every other day. The stools alternate between loose and solid, with two solid stools noted on April 26 and today.  Solid stools were noted to be hard and involve straining.  She did not start fiber or take senna as previously recommended.  She experiences significant abdominal cramping, particularly in the lower abdomen, which intensifies before and during bowel movements. The cramping slightly improves after bowel movements, but she often needs to lie down for relief. No blood in stools, but she has some nausea without vomiting.  She has not taken any antibiotics in the past six months and denies anyone around her having similar symptoms. She has not been taking any fiber supplements or laxatives.  Her blood pressure has been low, recorded at 90/40 today. She has not been on any blood pressure medications.     PREVIOUS GI WORKUP   Colonoscopy 11/2021 - Perianal skin tags found on perianal exam.  - One 5 mm polyp in the transverse colon, removed with a cold snare. Resected and retrieved.  - Multiple 1 to 4 mm polyps in the rectum, removed with a cold snare. Complete resection. Partial retrieval.  - Normal mucosa in the entire examined colon. Biopsied.  - The distal rectum and anal verge are normal on retroflexion view. -  Repeat 10 years (11/2031)  Diagnosis 1. Surgical [P], colon, transverse, polyp (1) BENIGN COLONIC MUCOSA WITH NO DIAGNOSTIC ABNORMALITY 2. Surgical [P], colon nos, random sites BENIGN COLONIC MUCOSA WITH NO DIAGNOSTIC ABNORMALITY 3. 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   Synovitis of wrist    RIGHT WRIST   Tear of triangular fibrocartilage complex (TFCC)    RIGHT WRIST    Past Surgical History:  Procedure Laterality Date   ABDOMINAL HYSTERECTOMY     ANTERIOR CERVICAL DECOMP/DISCECTOMY FUSION N/A 05/08/2020   Procedure: ANTERIOR CERVICAL DECOMPRESSION FUSION CERVICAL 6- CERVICAL 7 WITH INSTRUMENTATION AND ALLOGRAFT;  Surgeon: Virl Grimes, MD;  Location: [facility] OR;  Service: Orthopedics;  Laterality: N/A;   ANTERIOR CERVICAL DECOMPRESSION/DISCECTOMY FUSION 4 LEVELS N/A 08/18/2018   Procedure: ANTERIOR CERVICAL DECOMPRESSION FUSION, CERVICAL THREE TO FOUR, CERVICAL FOUR TO FIVE, CERVICAL FIVE TO SIX WITH INSTRUMENTATION AND ALLOGRAFT.;  Surgeon: Virl Grimes, MD;  Location: [facility] OR;  Service: Orthopedics;  Laterality: N/A;   BENIGN BREAST BX  1993   BREAST SURGERY     CESAREAN SECTION  2001  &  2003   2003  W/  BILATERAL TUBAL LIGATION   CESAREAN SECTION N/A    [employer]rees[employer] 06/27/2019   CYSTO/  RIGHT RETROGRADE PYELOGRAM/  RIGHT URETERAL STENT PLACMENT   03/01/2002   CYSTO/  STENT REMOVAL/  RIGHT ACCUSIZE RETROGRADE URETEROPELVIC JUNCTION INCISION AND STENT REPLACEMENT  12/05/2001   EYE SURGERY     HERNIA REPAIR     HIP  FUSION Right 2024   LAPAROSCOPIC ASSISTED VAGINAL HYSTERECTOMY  2014   LEEP/  D & C HYSTEROSCOPY/  NOVASURE ENDOMETRIAL ABLATION  08/23/2006   REPAIR LEFT TEAR DUCT INJURY  2013   SPINE SURGERY N/A    [employer]rees[employer] 06/27/2019   TOTAL NEPHRECTOMY Right 04/12/2002   TUBAL LIGATION     UMBILICAL HERNIA REPAIR  12/11/2008   LAPAROSCOPIC   WRIST ARTHROSCOPY Right 09/19/2014   Procedure: RIGHT WRIST ARTHROSCOPY AND TFCC ((TRIANGULAR FIBROCARTILAGE COMPLEX) DEBRIDEMENT ;  Surgeon: Arvil Birks, MD;  Location: [facility];  Service: Orthopedics;  Laterality: Right;    Current Outpatient Medications  Medication Sig Dispense Refill   acetaminophen  (TYLENOL ) 650 MG CR tablet Take 1,300 mg by mouth daily.     Bacillus Coagulans-Inulin (ALIGN PREBIOTIC-PROBIOTIC PO) Take 2 tablets by mouth in the morning, at noon, and at bedtime.     buPROPion (WELLBUTRIN XL) 300 MG 24 hr tablet Take 300 mg by mouth at bedtime.     busPIRone  (BUSPAR ) 10 MG tablet Take 2 tablets by mouth 3 (three) times daily.     dicyclomine  (BENTYL ) 20 MG tablet Take 20 mg by mouth 4 (four) times daily as needed.     diphenhydrAMINE  (BENADRYL ) 25 MG tablet Take 50 mg by mouth every 6 (six) hours as needed for allergies.     diphenhydramine -acetaminophen  (TYLENOL  PM) 25-500 MG TABS tablet Take 1 tablet by mouth at bedtime as needed.     sertraline  (ZOLOFT ) 100 MG tablet Take 1.5 tablets by mouth daily.     traZODone (DESYREL) 50 MG tablet Take 50 mg by mouth at bedtime as needed.     No current facility-administered medications for this visit.    Allergies as of 06/01/2023 - Review Complete 06/01/2023  Allergen Reaction Noted   Grass pollen([id number]-o-[id number]-swt vern) Itching and Other (See Comments) 12/13/2022    Family History  Problem Relation Age of Onset   Mental illness Mother        anxiety, Bipolar disorder   Diabetes Father    Asthma Brother    Diabetes Brother    Hyperlipidemia Brother    Cancer Maternal  Grandmother        pancreatic   Asthma Son    Asthma Son    Liver cancer Neg Hx    Esophageal cancer Neg Hx    Colon cancer Neg Hx     Social History   Socioeconomic History   Marital status: Married    Spouse name: [name]   Number of children: 2   Years of education: college   Highest education level: Not on file  Occupational History   Occupation: Lawyer    Comment: [employer]  Tobacco Use   Smoking status: Never   Smokeless tobacco: Never  Vaping Use   Vaping status: Never Used  Substance and Sexual Activity   Alcohol use: No    Alcohol/week: 0.0 standard drinks of alcohol    Comment: 2x year   Drug use: No   Sexual activity: Yes    Partners: Male    Birth control/protection:  Surgical    Comment: Hysterectomy  Other Topics Concern   Not on file  Social History Narrative   Degree in Social Work from [employer]. Working on [employer] in Engineer, manufacturing.   Lives with her husband and their two sons.   Social Drivers of Health   Financial Resource Strain: Medium Risk (05/09/2023)   [facility]   Overall Financial Resource Strain (CARDIA)    Difficulty of Paying Living Expenses: Somewhat hard  Food Insecurity: Food Insecurity Present (05/09/2023)   Received from [facility]   Hunger Vital Sign    Worried About Running Out of Food in the Last Year: Sometimes true    Ran Out of Food in the Last Year: Never true  Transportation Needs: No Transportation Needs (05/09/2023)   Received from [facility] - Transportation    Lack of Transportation (Medical): No    Lack of Transportation (Non-Medical): No  Physical Activity: Unknown (05/09/2023)   Received from [facility]   Exercise Vital Sign    Days of Exercise per Week: 0 days    Minutes of Exercise per Session: Not on file  Stress: Stress Concern Present (05/09/2023)   Received from [facility] - Occupational  Stress Questionnaire    Feeling of Stress : Very much  Social Connections: Moderately Integrated (05/09/2023)   Received from [facility]   Social Network    How would you rate your social network (family, work, friends)?: Adequate participation with social networks  Intimate Partner Violence: Not At Risk (05/09/2023)   Received from [facility]   HITS    Over the last 12 months how often did your partner physically hurt you?: Never    Over the last 12 months how often did your partner insult you or talk down to you?: Never    Over the last 12 months how often did your partner threaten you with physical harm?: Never    Over the last 12 months how often did your partner scream or curse at you?: Never    Review of Systems:    Constitutional: No weight loss, fever, chills, weakness or fatigue HEENT: Eyes: No change in vision               Ears, Nose, Throat:  No change in hearing or congestion Skin: No rash or itching Cardiovascular: No chest pain, chest pressure or palpitations   Respiratory: No SOB or cough Gastrointestinal: See HPI and otherwise negative Genitourinary: No dysuria or change in urinary frequency Neurological: No headache, dizziness or syncope Musculoskeletal: No new muscle or joint pain Hematologic: No bleeding or bruising Psychiatric: No history of depression or anxiety    Physical Exam:  Vital signs: BP (!) 90/40 (BP Location: Left Arm, Patient Position: Sitting, Cuff Size: Large)   Pulse 72   Ht 5' 0.25" (1.53 m) Comment: height measured without shoes  Wt 172 lb 8 oz (78.2 kg)   LMP  (LMP Unknown)   BMI 33.41 kg/m   Constitutional: NAD, alert and cooperative Head:  Normocephalic and atraumatic. Eyes:   PEERL, EOMI. No icterus. Conjunctiva pink. Respiratory: Respirations even and unlabored. Lungs clear to auscultation bilaterally.   No wheezes, crackles, or rhonchi.  Cardiovascular:  Regular rate and rhythm. No peripheral edema, cyanosis or pallor.   Gastrointestinal:  Soft, nondistended, nontender. No rebound or guarding. hypoactive bowel sounds. No appreciable masses or hepatomegaly. Rectal:  Declines Msk:  Symmetrical without gross deformities.  Without edema, no deformity or joint abnormality.  Neurologic:  Alert and  oriented x4;  grossly normal neurologically.  Skin:   Dry and intact without significant lesions or rashes. Psychiatric: Oriented to person, place and time. Demonstrates good judgement and reason without abnormal affect or behaviors.   RELEVANT LABS AND IMAGING: CBC    Component Value Date/Time   WBC 7.9 10/27/2021 1547   RBC 4.11 10/27/2021 1547   HGB 12.7 10/27/2021 1547   HGB 13.7 08/31/2017 0955   HCT 37.9 10/27/2021 1547   HCT 40.3 08/31/2017 0955   PLT 181.0 10/27/2021 1547   PLT 247 08/31/2017 0955   MCV 92.1 10/27/2021 1547   MCV 92 08/31/2017 0955   MCH 30.0 05/07/2020 1454   MCHC 33.5 10/27/2021 1547   RDW 13.8 10/27/2021 1547   RDW 13.6 08/31/2017 0955   LYMPHSABS 3.2 10/27/2021 1547   MONOABS 0.5 10/27/2021 1547   EOSABS 0.1 10/27/2021 1547   BASOSABS 0.1 10/27/2021 1547    CMP     Component Value Date/Time   NA 138 10/27/2021 1547   NA 144 06/30/2019 1640   K 3.8 10/27/2021 1547   CL 104 10/27/2021 1547   CO2 26 10/27/2021 1547   GLUCOSE 83 10/27/2021 1547   BUN 9 10/27/2021 1547   BUN 8 06/30/2019 1640   CREATININE 0.94 10/27/2021 1547   CALCIUM 9.4 10/27/2021 1547   PROT 7.6 10/27/2021 1547   PROT 6.6 06/30/2019 1640   ALBUMIN  4.4 10/27/2021 1547   ALBUMIN  4.3 06/30/2019 1640   AST 16 10/27/2021 1547   ALT 16 10/27/2021 1547   ALKPHOS 46 10/27/2021 1547   BILITOT 0.3 10/27/2021 1547   BILITOT 0.2 06/30/2019 1640   GFRNONAA >60 05/07/2020 1454   GFRAA 85 06/30/2019 1640     Assessment/Plan:   IBS Seen June 2024 the recommendation for fiber and senna.  Colonoscopy 2023 with hyperplastic polyp and recall of 10 years.  Intermittent diarrhea mixed with hard stools or days  without a bowel movement.  Has not tried fiber or senna as previously recommended.  Not interested in bowel purge. - Recommend Benefiber once to twice daily - KUB to evaluate stool burden for suspected overflow versus IBS mixed type - Recommend MiraLAX  1 capful daily adjust dose based on response - Can use over-the-counter IBgard for pain as needed - Follow-up 8 weeks  Hypotension Blood pressure today noted to be 90/40 and she states this has been similar over the past few doctors visits.  She does note occasional dizziness.  Reports poor hydration. - Follow-up with PCP regarding hypertension - Continue to stay hydrated - ED precautions provided   This visit required 36 minutes of patient care (this includes precharting, chart review, review of results, face-to-face time used for counseling as well as treatment plan and follow-up. The patient was provided an opportunity to ask questions and all were answered. The patient agreed with the plan and demonstrated an understanding of the instructions.   Gigi Kyle [facility] 06/01/2023, 3:14 PM  Cc: Chyrel Craw, NP

## 2023-06-10 ENCOUNTER — Ambulatory Visit: Payer: Self-pay | Admitting: Gastroenterology

## 2023-06-21 NOTE — Progress Notes (Signed)
 Agree with the assessment and plan as outlined by Suzanna Erp, PA-C.  Expect increasing dietary fiber would positively impact her stool frequency and consistency.
